# Patient Record
Sex: Male | Born: 1970 | Race: White | Hispanic: No | Marital: Married | Smoking: Former smoker
Health system: Southern US, Community
[De-identification: ages and names within clinical notes are randomized; demographics above are authoritative.]

## PROBLEM LIST (undated history)

## (undated) DIAGNOSIS — K219 Gastro-esophageal reflux disease without esophagitis: Secondary | ICD-10-CM

## (undated) DIAGNOSIS — I10 Essential (primary) hypertension: Secondary | ICD-10-CM

## (undated) DIAGNOSIS — I712 Thoracic aortic aneurysm, without rupture: Secondary | ICD-10-CM

## (undated) DIAGNOSIS — K7460 Unspecified cirrhosis of liver: Secondary | ICD-10-CM

## (undated) DIAGNOSIS — I7121 Aneurysm of the ascending aorta, without rupture: Secondary | ICD-10-CM

## (undated) DIAGNOSIS — E66811 Obesity, class 1: Secondary | ICD-10-CM

## (undated) DIAGNOSIS — G4730 Sleep apnea, unspecified: Secondary | ICD-10-CM

## (undated) DIAGNOSIS — E782 Mixed hyperlipidemia: Secondary | ICD-10-CM

## (undated) DIAGNOSIS — M7710 Lateral epicondylitis, unspecified elbow: Secondary | ICD-10-CM

## (undated) DIAGNOSIS — Z8669 Personal history of other diseases of the nervous system and sense organs: Secondary | ICD-10-CM

## (undated) DIAGNOSIS — K5792 Diverticulitis of intestine, part unspecified, without perforation or abscess without bleeding: Secondary | ICD-10-CM

## (undated) DIAGNOSIS — G4700 Insomnia, unspecified: Secondary | ICD-10-CM

## (undated) DIAGNOSIS — Z85038 Personal history of other malignant neoplasm of large intestine: Secondary | ICD-10-CM

## (undated) DIAGNOSIS — N183 Chronic kidney disease, stage 3 unspecified: Secondary | ICD-10-CM

## (undated) DIAGNOSIS — J4520 Mild intermittent asthma, uncomplicated: Secondary | ICD-10-CM

## (undated) DIAGNOSIS — R519 Headache, unspecified: Secondary | ICD-10-CM

## (undated) DIAGNOSIS — C801 Malignant (primary) neoplasm, unspecified: Secondary | ICD-10-CM

## (undated) DIAGNOSIS — E669 Obesity, unspecified: Secondary | ICD-10-CM

## (undated) HISTORY — DX: Obesity, unspecified: E66.9

## (undated) HISTORY — DX: Mixed hyperlipidemia: E78.2

## (undated) HISTORY — DX: Thoracic aortic aneurysm, without rupture: I71.2

## (undated) HISTORY — DX: Unspecified cirrhosis of liver: K74.60

## (undated) HISTORY — DX: Diverticulitis of intestine, part unspecified, without perforation or abscess without bleeding: K57.92

## (undated) HISTORY — DX: Mild intermittent asthma, uncomplicated: J45.20

## (undated) HISTORY — DX: Insomnia, unspecified: G47.00

## (undated) HISTORY — DX: Headache, unspecified: R51.9

## (undated) HISTORY — PX: COLONOSCOPY: SHX174

## (undated) HISTORY — DX: Essential (primary) hypertension: I10

## (undated) HISTORY — DX: Lateral epicondylitis, unspecified elbow: M77.10

## (undated) HISTORY — DX: Gastro-esophageal reflux disease without esophagitis: K21.9

## (undated) HISTORY — DX: Aneurysm of the ascending aorta, without rupture: I71.21

## (undated) HISTORY — DX: Obesity, class 1: E66.811

## (undated) HISTORY — DX: Chronic kidney disease, stage 3 unspecified: N18.30

## (undated) HISTORY — DX: Personal history of other diseases of the nervous system and sense organs: Z86.69

---
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
## 2013-04-17 DIAGNOSIS — Z85038 Personal history of other malignant neoplasm of large intestine: Secondary | ICD-10-CM

## 2013-04-17 HISTORY — PX: COLON SURGERY: SHX602

## 2013-04-17 HISTORY — DX: Personal history of other malignant neoplasm of large intestine: Z85.038

## 2013-04-17 HISTORY — PX: COLOSTOMY: SHX63

## 2014-04-17 HISTORY — PX: ILEOSTOMY: SHX1783

## 2019-01-01 ENCOUNTER — Other Ambulatory Visit: Payer: Self-pay

## 2019-01-01 ENCOUNTER — Ambulatory Visit (INDEPENDENT_AMBULATORY_CARE_PROVIDER_SITE_OTHER): Payer: PRIVATE HEALTH INSURANCE | Admitting: Family Medicine

## 2019-01-01 ENCOUNTER — Encounter: Payer: Self-pay | Admitting: Family Medicine

## 2019-01-01 DIAGNOSIS — M25521 Pain in right elbow: Secondary | ICD-10-CM | POA: Diagnosis not present

## 2019-01-01 NOTE — Progress Notes (Signed)
   Office Visit Note   Patient: Clayton Hernandez           Date of Birth: 1970/10/01           MRN: [id number] Visit Date: 01/01/2019 Requested by: No referring provider defined for this encounter. PCP: No primary care provider on file.  Subjective: Chief Complaint  Patient presents with  . Right Elbow - Pain    Pain lateral aspect of elbow, with pain and numbness in the forearm and into the 5th finger. Popping/cracking in elbow. NKI, but did recently move here from [place].    HPI: He is here with right elbow pain.  He is right-hand dominant, does not recall a specific injury.  He and his wife recently moved to [place] from [place].  He has had pain on the lateral aspect of his elbow for the past couple months.  Occasional popping in his elbow but no locking.  Denies any numbness or tingling.  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              ROS: Denies fevers or chills.  All other systems were reviewed and are negative.  Objective: Vital Signs: There were no vitals taken for this visit.  Physical Exam:  General:  Alert and oriented, in no acute distress. Pulm:  Breathing unlabored. Psy:  Normal mood, congruent affect. Skin: Multiple tattoos but no erythema or rash. Right elbow: No detectable effusion, full range of motion with flexion, extension, forearm pronation and supination.  He has point tenderness at the common extensor tendon at the lateral epicondyle, no tenderness at the radial tunnel.  Pain with wrist extension and third finger extension against resistance as well as forearm pronation and supination.  Imaging: Limited diagnostic ultrasound was performed but not recorded.  He has tendinopathy changes at the common extensor tendon but no obvious tears.  No detectable joint effusion.  Assessment & Plan: 1.  Right elbow pain due to lateral epicondylitis -Discussed options with him and elected to inject  with steroid today.  If symptoms persist then physical/hand therapy.  He will be cautious with activities for the next 10 to 14 days.     Procedures: Right elbow injection: After sterile prep with Betadine, injected 3 cc 1% lidocaine without epinephrine and 40 mg methylprednisolone into the area of maximum tenderness at the common extensor tendon.  He had excellent immediate relief.    PMFS History: There are no active problems to display for this patient.  History reviewed. No pertinent past medical history.  History reviewed. No pertinent family history.  History reviewed. No pertinent surgical history. Social History   Occupational History  . Not on file  Tobacco Use  . Smoking status: Former Smoker    Types: Cigarettes    Quit date: 12/31/1988    Years since quitting: 30.0  . Smokeless tobacco: Never Used  Substance and Sexual Activity  . Alcohol use: Yes    Comment: rarely  . Drug use: Not on file  . Sexual activity: Not on file

## 2019-01-20 NOTE — Progress Notes (Deleted)
Cardiology Office Note   Date:  01/22/2019   ID:  MAYNARD PINTA, DOB 1970/08/25, MRN [id number]  PCP:  No primary care provider on file.  Cardiologist:   No primary care provider on file. Referring:  ***  No chief complaint on file.     History of Present Illness: Clayton Hernandez is a 48 y.o. male who presents for ***     No past medical history on file.  No past surgical history on file.   Current Outpatient Medications  Medication Sig Dispense Refill  . ALBUTEROL SULFATE HFA IN Inhale into the lungs as needed.    [name] amLODipine (NORVASC) 5 MG tablet Take 5 mg by mouth daily.    . fenofibrate (TRICOR) 145 MG tablet Take 145 mg by mouth daily.    . metoprolol tartrate (LOPRESSOR) 50 MG tablet Take 50 mg by mouth daily.    [name] omeprazole (PRILOSEC OTC) 20 MG tablet Take 20 mg by mouth daily.    . rosuvastatin (CRESTOR) 20 MG tablet Take 20 mg by mouth daily.    . valsartan (DIOVAN) 160 MG tablet Take 160 mg by mouth daily.     No current facility-administered medications for this visit.     Allergies:   Morphine and related, Penicillins, and Toradol [ketorolac tromethamine]    Social History:  The patient  reports that he quit smoking about 30 years ago. His smoking use included cigarettes. He has never used smokeless tobacco. He reports current alcohol use.   Family History:  The patient's ***family history is not on file.    ROS:  Please see the history of present illness.   Otherwise, review of systems are positive for {NONE DEFAULTED:18576::"none"}.   All other systems are reviewed and negative.    PHYSICAL EXAM: VS:  There were no vitals taken for this visit. , BMI There is no height or weight on file to calculate BMI. GENERAL:  Well appearing HEENT:  Pupils equal round and reactive, fundi not visualized, oral mucosa unremarkable NECK:  No jugular venous distention, waveform within normal limits, carotid upstroke brisk and symmetric, no bruits, no  thyromegaly LYMPHATICS:  No cervical, inguinal adenopathy LUNGS:  Clear to auscultation bilaterally BACK:  No CVA tenderness CHEST:  Unremarkable HEART:  PMI not displaced or sustained,S1 and S2 within normal limits, no S3, no S4, no clicks, no rubs, *** murmurs ABD:  Flat, positive bowel sounds normal in frequency in pitch, no bruits, no rebound, no guarding, no midline pulsatile mass, no hepatomegaly, no splenomegaly EXT:  2 plus pulses throughout, no edema, no cyanosis no clubbing SKIN:  No rashes no nodules NEURO:  Cranial nerves II through XII grossly intact, motor grossly intact throughout PSYCH:  Cognitively intact, oriented to person place and time    EKG:  EKG {ACTION; IS/IS GI:087931 ordered today. The ekg ordered today demonstrates ***   Recent Labs: No results found for requested labs within last 8760 hours.    Lipid Panel No results found for: CHOL, TRIG, HDL, CHOLHDL, VLDL, LDLCALC, LDLDIRECT    Wt Readings from Last 3 Encounters:  No data found for Wt      Other studies Reviewed: Additional studies/ records that were reviewed today include: ***. Review of the above records demonstrates:  Please see elsewhere in the note.  ***   ASSESSMENT AND PLAN:  ***   Current medicines are reviewed at length with the patient today.  The patient {ACTIONS; HAS/DOES NOT HAVE:19233} concerns regarding  medicines.  The following changes have been made:  {PLAN; NO CHANGE:13088:s}  Labs/ tests ordered today include: *** No orders of the defined types were placed in this encounter.    Disposition:   FU with ***    Signed, [name], MD  01/22/2019 9:10 AM    [place]

## 2019-01-22 ENCOUNTER — Ambulatory Visit: Payer: Self-pay | Admitting: Cardiology

## 2019-01-29 ENCOUNTER — Encounter: Payer: Self-pay | Admitting: Family Medicine

## 2019-01-29 ENCOUNTER — Other Ambulatory Visit: Payer: Self-pay

## 2019-01-29 ENCOUNTER — Ambulatory Visit (INDEPENDENT_AMBULATORY_CARE_PROVIDER_SITE_OTHER): Payer: PRIVATE HEALTH INSURANCE | Admitting: Family Medicine

## 2019-01-29 VITALS — BP 138/90 | HR 66 | Temp 98.5°F | Resp 16 | Ht 71.0 in | Wt 222.6 lb

## 2019-01-29 DIAGNOSIS — E782 Mixed hyperlipidemia: Secondary | ICD-10-CM | POA: Diagnosis not present

## 2019-01-29 DIAGNOSIS — Z85038 Personal history of other malignant neoplasm of large intestine: Secondary | ICD-10-CM

## 2019-01-29 DIAGNOSIS — K7460 Unspecified cirrhosis of liver: Secondary | ICD-10-CM | POA: Diagnosis not present

## 2019-01-29 DIAGNOSIS — I10 Essential (primary) hypertension: Secondary | ICD-10-CM

## 2019-01-29 MED ORDER — AMLODIPINE BESYLATE 10 MG PO TABS: 10.0000 mg | ORAL_TABLET | Freq: Every day | ORAL | 3 refills | Status: DC

## 2019-01-29 NOTE — Progress Notes (Signed)
Office Note 01/29/2019  CC:  Chief Complaint  Patient presents with  . Establish Care    Previous PCP, Dr.Goodrich in [place]    HPI:  Clayton Hernandez is a 48 y.o. male who is here to establish care, f/u HTN, mixed HLD.  Patient's most recent primary MD: see above. Old records were not reviewed prior to or during today's visit.  He monitors bp at home some and he often sees high [id number] systolic.  Compliant with meds. HLD: tolerating crestor 20mg  qd, compliant.  Has a long, complicated GI surgery hx and also gives report of dx of nonalcoholic cirrhosis. No known hx of ascites or other decompensation.  Has appt with cardiology 02/03/19, has long hx of ascending aortic aneurism that pt states has been stable but is 4.7 cm in size.   Past Medical History:  Diagnosis Date  . Ascending aortic aneurysm (HCC)    4.7 cm, most recent imaging was 12/2018->stable.  . Cirrhosis, nonalcoholic (HCC)    NAFLD  . Diverticulitis   . Essential hypertension   . Frequent headaches   . GERD (gastroesophageal reflux disease)   . History of colon cancer 2015   Surgery; but no chemo or rad was required.  Had a total of 5 surgeries, hx of colostomy, then an ileostomy.  [name] Hx of migraines   . Hyperlipemia, mixed   . Insomnia   . Lateral epicondylitis    steroid injection by Dr. Junius Roads 01/01/19.  . Mild intermittent asthma   . Obesity, Class I, BMI 30-34.9     Past Surgical History:  Procedure Laterality Date  . COLON SURGERY  2015   2 cm colon ca excised.  . COLONOSCOPY  many   he has had colonoscopy q64mo since 2015.  Most recent was approx 04/2018->normal.  . COLOSTOMY  2015   diverticulitis   . ILEOSTOMY  2016    Family History  Problem Relation Age of Onset  . Lymphoma Mother   . Cancer Father        ureter  . Stomach cancer Father   . Heart disease Father   . High blood pressure Father   . High Cholesterol Father   . Thyroid cancer Sister     Social History    Socioeconomic History  . Marital status: Married    Spouse name: Not on file  . Number of children: Not on file  . Years of education: Not on file  . Highest education level: Not on file  Occupational History  . Not on file  Social Needs  . Financial resource strain: Not on file  . Food insecurity    Worry: Not on file    Inability: Not on file  . Transportation needs    Medical: Not on file    Non-medical: Not on file  Tobacco Use  . Smoking status: Former Smoker    Types: Cigarettes    Quit date: 12/31/1988    Years since quitting: 30.0  . Smokeless tobacco: Never Used  Substance and Sexual Activity  . Alcohol use: Yes    Comment: rarely  . Drug use: Not on file  . Sexual activity: Not on file  Lifestyle  . Physical activity    Days per week: Not on file    Minutes per session: Not on file  . Stress: Not on file  Relationships  . Social Herbalist on phone: Not on file    Gets together: Not on file  Attends religious service: Not on file    Active member of club or organization: Not on file    Attends meetings of clubs or organizations: Not on file    Relationship status: Not on file  . Intimate partner violence    Fear of current or ex partner: Not on file    Emotionally abused: Not on file    Physically abused: Not on file    Forced sexual activity: Not on file  Other Topics Concern  . Not on file  Social History Narrative   Married, 2 daughters and 1 son.   Relocated from [place] to [place].  Born and raised in [place].  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   No T/A/Ds.    Outpatient Encounter Medications as of 01/29/2019  Medication Sig  . ALBUTEROL SULFATE HFA IN Inhale into the lungs as needed.  . fenofibrate (TRICOR) 145 MG tablet Take 145 mg by mouth daily.  . metoprolol succinate (TOPROL-XL) 50 MG 24 hr tablet Take 50 mg by mouth daily. Take with  or immediately following a meal.  . omeprazole (PRILOSEC OTC) 20 MG tablet Take 20 mg by mouth daily.  . rosuvastatin (CRESTOR) 20 MG tablet Take 20 mg by mouth daily.  . valsartan (DIOVAN) 160 MG tablet Take 160 mg by mouth daily.  . [DISCONTINUED] amLODipine (NORVASC) 5 MG tablet Take 5 mg by mouth daily.  . [DISCONTINUED] metoprolol tartrate (LOPRESSOR) 50 MG tablet Take 50 mg by mouth daily.  [name] amLODipine (NORVASC) 10 MG tablet Take 1 tablet (10 mg total) by mouth daily.   No facility-administered encounter medications on file as of 01/29/2019.     Allergies  Allergen Reactions  . Morphine And Related Other (See Comments)    "all opiates" - "they'll kill me"  . Penicillins Anaphylaxis  . Toradol [Ketorolac Tromethamine]     ROS Review of Systems  Constitutional: Negative for fatigue and fever.  HENT: Negative for congestion and sore throat.   Eyes: Negative for visual disturbance.  Respiratory: Negative for cough.   Cardiovascular: Negative for chest pain.  Gastrointestinal: Negative for abdominal pain and nausea.  Genitourinary: Negative for dysuria.  Musculoskeletal: Negative for back pain and joint swelling.  Skin: Negative for rash.  Neurological: Negative for weakness and headaches.  Hematological: Negative for adenopathy.    PE; Blood pressure 138/90, pulse 66, temperature 98.5 F (36.9 C), temperature source Temporal, resp. rate 16, height 5\' 11"  (1.803 m), weight 222 lb 9.6 oz (101 kg), SpO2 99 %. Body mass index is 31.05 kg/m.  Gen: Alert, well appearing.  Patient is oriented to person, place, time, and situation. AFFECT: pleasant, lucid thought and speech. [id number]: no injection, icteris, swelling, or exudate.  EOMI, PERRLA. Mouth: lips without lesion/swelling.  Oral mucosa pink and moist. Oropharynx without erythema, exudate, or swelling.  Neck: supple/nontender.  No LAD, mass, or TM.  Carotid pulses 2+ bilaterally, without bruits. CV: RRR, no m/r/g.    LUNGS: CTA bilat, nonlabored resps, good aeration in all lung fields. ABD: soft, NT/ND EXT: no clubbing or cyanosis.  no edema.  Skin - no pallor or jaundice.  Pertinent labs:  None   ASSESSMENT AND PLAN:   New pt: he has all his old records but not with him at this time.  1) HTN: not ideal control.  Needs 120s/70s consistently given  his hx of aortic aneurism. Will just increase amlodipine to 10mg  qd today.  No change to his toprol or valsartan. He sees cardiology in 2 d. Continue home bp monitoring. Return for fasting labs: CMET, FLP, CBC.  2) HLD: tolerating statin. FLP and hepatic panel with future fasting labs.  3) Nonalcoholic cirrhosis/NAFLD: no old records for verification/clarification available to me at this time but I don't have reason to doubt patient's report. Refer to GI for this and for general f/u of his hx of colon ca.  4) Hx of colon cancer-> refer to GI.  An After Visit Summary was printed and given to the patient.  Return in about 6 months (around 07/30/2019) for annual CPE (fasting); also needs fasting lab appt at his earliest convenience.  Signed:  Crissie Sickles, MD           01/29/2019

## 2019-01-30 ENCOUNTER — Ambulatory Visit (INDEPENDENT_AMBULATORY_CARE_PROVIDER_SITE_OTHER): Payer: PRIVATE HEALTH INSURANCE | Admitting: Family Medicine

## 2019-01-30 DIAGNOSIS — E782 Mixed hyperlipidemia: Secondary | ICD-10-CM | POA: Diagnosis not present

## 2019-01-30 DIAGNOSIS — K7460 Unspecified cirrhosis of liver: Secondary | ICD-10-CM

## 2019-01-30 DIAGNOSIS — I10 Essential (primary) hypertension: Secondary | ICD-10-CM

## 2019-01-30 LAB — CBC WITH DIFFERENTIAL/PLATELET
Basophils Absolute: 0.1 10*3/uL (ref 0.0–0.1)
Basophils Relative: 0.9 % (ref 0.0–3.0)
Eosinophils Absolute: 0.1 10*3/uL (ref 0.0–0.7)
Eosinophils Relative: 1 % (ref 0.0–5.0)
HCT: 47.7 % (ref 39.0–52.0)
Hemoglobin: 16.1 g/dL (ref 13.0–17.0)
Lymphocytes Relative: 32.4 % (ref 12.0–46.0)
Lymphs Abs: 1.9 10*3/uL (ref 0.7–4.0)
MCHC: 33.7 g/dL (ref 30.0–36.0)
MCV: 90.4 fl (ref 78.0–100.0)
Monocytes Absolute: 0.6 10*3/uL (ref 0.1–1.0)
Monocytes Relative: 9.7 % (ref 3.0–12.0)
Neutro Abs: 3.2 10*3/uL (ref 1.4–7.7)
Neutrophils Relative %: 56 % (ref 43.0–77.0)
Platelets: 121 10*3/uL — ABNORMAL LOW (ref 150.0–400.0)
RBC: 5.28 Mil/uL (ref 4.22–5.81)
RDW: 14.7 % (ref 11.5–15.5)
WBC: 5.8 10*3/uL (ref 4.0–10.5)

## 2019-01-30 LAB — LIPID PANEL
Cholesterol: 137 mg/dL (ref 0–200)
HDL: 39 mg/dL — ABNORMAL LOW (ref >39.00)
LDL Cholesterol: 67 mg/dL (ref 0–99)
NonHDL: 97.86
Total CHOL/HDL Ratio: 4
Triglycerides: 156 mg/dL — ABNORMAL HIGH (ref 0.0–149.0)
VLDL: 31.2 mg/dL (ref 0.0–40.0)

## 2019-01-30 LAB — COMPREHENSIVE METABOLIC PANEL
ALT: 29 U/L (ref 0–53)
AST: 29 U/L (ref 0–37)
Albumin: 4.7 g/dL (ref 3.5–5.2)
Alkaline Phosphatase: 68 U/L (ref 39–117)
BUN: 27 mg/dL — ABNORMAL HIGH (ref 6–23)
CO2: 27 mEq/L (ref 19–32)
Calcium: 9.4 mg/dL (ref 8.4–10.5)
Chloride: 105 mEq/L (ref 96–112)
Creatinine, Ser: 1.35 mg/dL (ref 0.40–1.50)
GFR: 55.89 mL/min — ABNORMAL LOW (ref >60.00)
Glucose, Bld: 98 mg/dL (ref 70–99)
Potassium: 3.9 mEq/L (ref 3.5–5.1)
Sodium: 139 mEq/L (ref 135–145)
Total Bilirubin: 1 mg/dL (ref 0.2–1.2)
Total Protein: 7.1 g/dL (ref 6.0–8.3)

## 2019-02-02 NOTE — Progress Notes (Signed)
Cardiology Office Note   Date:  02/04/2019    ID:  Clayton Hernandez, DOB Sep 11, 1970, MRN [id number]  PCP:  [name], MD  Cardiologist:   No primary care provider on file. Referring:  [name], MD  Chief Complaint  Patient presents with  . Thoracic Aortic Aneurysm      History of Present Illness: Clayton Hernandez is a 49 y.o. male who presents for follow up of a thoracic ascending aneurysm.  He has moved here from [place].  He has had a complicated history but per diverticulum that required surgery.  He subsequently was found to have small colon cancers that were resected.  During all of this he was found to have an enlarged thoracic aorta that has now been followed.  There is CT from last year it was 4.4 cm.  Prior to that he reportedly had been larger.  I do note that there was no calcium noted in his coronaries at that time.  He had a bovine aortic arch variant.  He was apparently having some symptoms of chest discomfort last year before he moved from [place] and reports having had a [employer] that he says was negative.  He had always been a healthy person.  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  He is very active in his yard.  He did some hiking yesterday and he did well.  He denies any cardiovascular symptoms such as chest pressure, neck or arm discomfort.  He has had no palpitations, presyncope or syncope.  He denies any PND or orthopnea.  He has had no weight gain or edema.  He does feel fatigued.  He sleeps very poorly.  He does have CPAP which he thinks works.  However, he does not sleep through the night.  This is happened probably for the last 19 years or so.  He cannot turn his mind off.  He takes Ambien rarely but does not like it.  He does not want to take a lot of these medications.   Past Medical History:  Diagnosis Date  . Ascending aortic aneurysm (HCC)    4.7 cm, most recent imaging was 12/2018->stable.  .  Chronic renal insufficiency, stage 3 (moderate)    GFR 55 ml/min  . Cirrhosis, nonalcoholic (HCC)    NAFLD  . Diverticulitis   . Essential hypertension   . Frequent headaches   . GERD (gastroesophageal reflux disease)   . History of colon cancer 2015   Surgery; but no chemo or rad was required.  Had a total of 5 surgeries, hx of colostomy, then an ileostomy.  [name] Hx of migraines   . Hyperlipemia, mixed   . Insomnia   . Lateral epicondylitis    steroid injection by Dr. Junius Roads 01/01/19.  . Mild intermittent asthma   . Obesity, Class I, BMI 30-34.9     Past Surgical History:  Procedure Laterality Date  . COLON SURGERY  2015   2 cm colon ca excised.  . COLONOSCOPY  many   he has had colonoscopy q52mo since 2015.  Most recent was approx 04/2018->normal.  . COLOSTOMY  2015   diverticulitis   . ILEOSTOMY  2016     Current Outpatient Medications  Medication Sig Dispense Refill  . ALBUTEROL SULFATE HFA IN Inhale into the lungs as needed.    [name] amLODipine (NORVASC) 10 MG tablet Take 1 tablet (10 mg total) by mouth daily.  90 tablet 3  . fenofibrate (TRICOR) 145 MG tablet Take 145 mg by mouth daily.    . metoprolol succinate (TOPROL-XL) 50 MG 24 hr tablet Take 50 mg by mouth daily. Take with or immediately following a meal.    . omeprazole (PRILOSEC OTC) 20 MG tablet Take 20 mg by mouth daily.    . rosuvastatin (CRESTOR) 20 MG tablet Take 20 mg by mouth daily.    . valsartan (DIOVAN) 160 MG tablet Take 160 mg by mouth daily.     No current facility-administered medications for this visit.     Allergies:   Morphine and related, Penicillins, and Toradol [ketorolac tromethamine]    Social History:  The patient  reports that he quit smoking about 30 years ago. His smoking use included cigarettes. He has never used smokeless tobacco. He reports current alcohol use.   Family History:  The patient's family history includes Cancer in his father; Heart disease (age of onset: 50) in his father;  High Cholesterol in his father; High blood pressure in his father; Lymphoma in his mother; Stomach cancer in his father; Thyroid cancer in his sister.    ROS:  Please see the history of present illness.   Otherwise, review of systems are positive for none.   All other systems are reviewed and negative.    PHYSICAL EXAM: VS:  BP 114/82   Pulse (!) 58   Temp (!) 97 F (36.1 C) (Temporal)   Ht 6\' 2"  (1.88 m)   Wt 224 lb 3.2 oz (101.7 kg)   SpO2 94%   BMI 28.79 kg/m  , BMI Body mass index is 28.79 kg/m. GENERAL:  Well appearing HEENT:  Pupils equal round and reactive, fundi not visualized, oral mucosa unremarkable NECK:  No jugular venous distention, waveform within normal limits, carotid upstroke brisk and symmetric, no bruits, no thyromegaly LYMPHATICS:  No cervical, inguinal adenopathy LUNGS:  Clear to auscultation bilaterally BACK:  No CVA tenderness CHEST:  Unremarkable HEART:  PMI not displaced or sustained,S1 and S2 within normal limits, no S3, no S4, no clicks, no rubs, no murmurs ABD:  Flat, positive bowel sounds normal in frequency in pitch, no bruits, no rebound, no guarding, no midline pulsatile mass, no hepatomegaly, no splenomegaly EXT:  2 plus pulses throughout, no edema, no cyanosis no clubbing SKIN:  No rashes no nodules NEURO:  Cranial nerves II through XII grossly intact, motor grossly intact throughout PSYCH:  Cognitively intact, oriented to person place and time    EKG:  EKG is ordered today. The ekg ordered today demonstrates sinus rhythm, rate 58 axis within normal limits, intervals within normal limits, no acute ST-T wave changes.   Recent Labs: 01/30/2019: ALT 29; BUN 27; Creatinine, Ser 1.35; Hemoglobin 16.1; Platelets 121.0; Potassium 3.9; Sodium 139    Lipid Panel    Component Value Date/Time   CHOL 137 01/30/2019 1016   TRIG 156.0 (H) 01/30/2019 1016   HDL 39.00 (L) 01/30/2019 1016   CHOLHDL 4 01/30/2019 1016   VLDL 31.2 01/30/2019 1016    LDLCALC 67 01/30/2019 1016      Wt Readings from Last 3 Encounters:  02/03/19 224 lb 3.2 oz (101.7 kg)  01/29/19 222 lb 9.6 oz (101 kg)      Other studies Reviewed: Additional studies/ records that were reviewed today include: CTA Thoracic aorta. Review of the above records demonstrates:  Please see elsewhere in the note.     ASSESSMENT AND PLAN:  THORACIC AORTIC ANEURYSM: He  needs follow-up with a CT angiogram.  I will arrange this.  INSOMNIA: The patient has significant fatigue probably related in part to insomnia.  I have arranged follow-up with Dr. Brett Fairy not only to follow his sleep apnea but also discuss potential medical therapies for this.  FATIGUE: The patient does have tiredness.  This may be related to insomnia.  Ambien and reduce his metoprolol very slightly as I think his pressure is well controlled perhaps some of his tiredness might be related.    HTN: He recently had his Norvasc increased and I agree with this.  He needs tight blood pressure control.   Current medicines are reviewed at length with the patient today.  The patient does not have concerns regarding medicines.  The following changes have been made:  As above  Labs/ tests ordered today include: None  Orders Placed This Encounter  Procedures  . CT ANGIO CHEST AORTA W &/OR WO CONTRAST  . Ambulatory referral to Neurology  . EKG 12-Lead     Disposition:   FU with me in four months.     Signed, [name], MD  02/04/2019 6:20 PM    [facility]

## 2019-02-03 ENCOUNTER — Other Ambulatory Visit: Payer: Self-pay

## 2019-02-03 ENCOUNTER — Encounter: Payer: Self-pay | Admitting: Cardiology

## 2019-02-03 ENCOUNTER — Ambulatory Visit (INDEPENDENT_AMBULATORY_CARE_PROVIDER_SITE_OTHER): Payer: PRIVATE HEALTH INSURANCE | Admitting: Cardiology

## 2019-02-03 VITALS — BP 114/82 | HR 58 | Temp 97.0°F | Ht 74.0 in | Wt 224.2 lb

## 2019-02-03 DIAGNOSIS — I712 Thoracic aortic aneurysm, without rupture, unspecified: Secondary | ICD-10-CM

## 2019-02-03 DIAGNOSIS — R0681 Apnea, not elsewhere classified: Secondary | ICD-10-CM

## 2019-02-03 DIAGNOSIS — I7121 Aneurysm of the ascending aorta, without rupture: Secondary | ICD-10-CM

## 2019-02-03 NOTE — Patient Instructions (Addendum)
Medication Instructions:  Your physician recommends that you continue on your current medications as directed. Please refer to the Current Medication list given to you today.  *If you need a refill on your cardiac medications before your next appointment, please call your pharmacy*  Lab Work: BMET 1 WEEK PRIOR TO CT  Testing/Procedures: Non-Cardiac CT scanning, (CAT scanning), is a noninvasive, special x-ray that produces cross-sectional images of the body using x-rays and a computer. CT scans help physicians diagnose and treat medical conditions. For some CT exams, a contrast material is used to enhance visibility in the area of the body being studied. CT scans provide greater clarity and reveal more details than regular x-ray exams.  Follow-Up: At [facility], you and your health needs are our priority.  As part of our continuing mission to provide you with exceptional heart care, we have created designated Provider Care Teams.  These Care Teams include your primary Cardiologist (physician) and Advanced Practice Providers (APPs -  Physician Assistants and Nurse Practitioners) who all work together to provide you with the care you need, when you need it.  Your next appointment:   4 months  The format for your next appointment:   In Person  Provider:   You may see [name]  or one of the following Advanced Practice Providers on your designated Care Team:    Rosaria Ferries, PA-C  Jory Sims, DNP, ANP  Cadence Kathlen Mody, NP  You have been referred to [place] AT [phone]

## 2019-02-04 ENCOUNTER — Encounter: Payer: Self-pay | Admitting: Cardiology

## 2019-02-18 ENCOUNTER — Ambulatory Visit (INDEPENDENT_AMBULATORY_CARE_PROVIDER_SITE_OTHER)
Admission: RE | Admit: 2019-02-18 | Discharge: 2019-02-18 | Disposition: A | Discharge status: Home / Self Care | Payer: PRIVATE HEALTH INSURANCE | Source: Ambulatory Visit | Attending: Cardiology | Admitting: Cardiology

## 2019-02-18 ENCOUNTER — Other Ambulatory Visit: Payer: Self-pay

## 2019-02-18 DIAGNOSIS — I712 Thoracic aortic aneurysm, without rupture: Secondary | ICD-10-CM | POA: Diagnosis not present

## 2019-02-18 DIAGNOSIS — I7121 Aneurysm of the ascending aorta, without rupture: Secondary | ICD-10-CM

## 2019-02-18 MED ORDER — IOHEXOL 350 MG/ML SOLN
100.0000 mL | Freq: Once | INTRAVENOUS | Status: AC | PRN
  Administered 2019-02-18: 09:00:00 100 mL via INTRAVENOUS

## 2019-02-20 ENCOUNTER — Telehealth: Payer: Self-pay | Admitting: Family Medicine

## 2019-02-20 NOTE — Telephone Encounter (Signed)
Do I need to do anything with this? It looks like the patient has an appointment with Dr. Junius Roads on 11/09.

## 2019-02-20 NOTE — Telephone Encounter (Signed)
Returned call to patient's wife Clayton Hernandez left message to return call per her request to schedule an appointment for cortisone injection with Dr Junius Roads  For patient   [phone]

## 2019-02-20 NOTE — Telephone Encounter (Signed)
No, I just noted that I contacted the patient and was waiting for a call back

## 2019-02-24 ENCOUNTER — Ambulatory Visit (INDEPENDENT_AMBULATORY_CARE_PROVIDER_SITE_OTHER): Payer: PRIVATE HEALTH INSURANCE | Admitting: Family Medicine

## 2019-02-24 ENCOUNTER — Encounter: Payer: Self-pay | Admitting: Family Medicine

## 2019-02-24 ENCOUNTER — Other Ambulatory Visit: Payer: Self-pay

## 2019-02-24 DIAGNOSIS — M25521 Pain in right elbow: Secondary | ICD-10-CM

## 2019-02-24 NOTE — Progress Notes (Signed)
Office Visit Note   Patient: Clayton Hernandez           Date of Birth: 02-14-1971           MRN: [id number] Visit Date: 02/24/2019 Requested by: [name], MD [address] [place],  [place] [address] PCP: [name], MD  Subjective: Chief Complaint  Patient presents with  . Right Elbow - Pain    Pain returned about 1 week ago in the elbow. Pain is the same as before, but not as severe. No new injury.    HPI: He is here with recurrent right elbow pain.  He was completely pain-free until about a week ago.  Pain again on the lateral elbow, hurts when doing repetitive activities.  Occasional tingling into his hand but not frequently.               ROS:   All other systems were reviewed and are negative.  Objective: Vital Signs: There were no vitals taken for this visit.  Physical Exam:  General:  Alert and oriented, in no acute distress. Pulm:  Breathing unlabored. Psy:  Normal mood, congruent affect.  Right elbow: Full range of motion, no effusion.  Point tender at the common extensor tendon at the lateral epicondyle.  No tenderness at the radial tunnel.  Pain with wrist extension and third finger extension against resistance.  Imaging: None today.  Assessment & Plan: 1.  Recurrent right lateral epicondylitis -Discussed with patient, he wants to try 1 more injection.  If this still does not help, then physical therapy.     Procedures: Right elbow injection: After sterile prep with Betadine, injected 2 cc 1% lidocaine without epinephrine and 40 mg methylprednisolone into the area of maximum tenderness at the common extensor tendon.    PMFS History: There are no active problems to display for this patient.  Past Medical History:  Diagnosis Date  . Ascending aortic aneurysm (HCC)    4.7 cm, most recent imaging was 12/2018->stable.  . Chronic renal insufficiency, stage 3 (moderate)    GFR 55 ml/min  . Cirrhosis, nonalcoholic (HCC)    NAFLD  .  Diverticulitis   . Essential hypertension   . Frequent headaches   . GERD (gastroesophageal reflux disease)   . History of colon cancer 2015   Surgery; but no chemo or rad was required.  Had a total of 5 surgeries, hx of colostomy, then an ileostomy.  [name] Hx of migraines   . Hyperlipemia, mixed   . Insomnia   . Lateral epicondylitis    steroid injection by Dr. Junius Roads 01/01/19.  . Mild intermittent asthma   . Obesity, Class I, BMI 30-34.9     Family History  Problem Relation Age of Onset  . Lymphoma Mother   . Cancer Father        ureter  . Stomach cancer Father   . Heart disease Father 87       Stents  . High blood pressure Father   . High Cholesterol Father   . Thyroid cancer Sister     Past Surgical History:  Procedure Laterality Date  . COLON SURGERY  2015   2 cm colon ca excised.  . COLONOSCOPY  many   he has had colonoscopy q68mo since 2015.  Most recent was approx 04/2018->normal.  . COLOSTOMY  2015   diverticulitis   . ILEOSTOMY  2016   Social History   Occupational History  . Not on  file  Tobacco Use  . Smoking status: Former Smoker    Types: Cigarettes    Quit date: 12/31/1988    Years since quitting: 30.1  . Smokeless tobacco: Never Used  Substance and Sexual Activity  . Alcohol use: Yes    Comment: rarely  . Drug use: Not on file  . Sexual activity: Not on file

## 2019-02-26 ENCOUNTER — Encounter: Payer: Self-pay | Admitting: Family Medicine

## 2019-03-04 ENCOUNTER — Telehealth: Payer: Self-pay | Admitting: *Deleted

## 2019-03-04 DIAGNOSIS — M79604 Pain in right leg: Secondary | ICD-10-CM

## 2019-03-04 DIAGNOSIS — M79605 Pain in left leg: Secondary | ICD-10-CM

## 2019-03-04 NOTE — Telephone Encounter (Signed)
Pain started about 2 months ago, stated discussed briefly at visit but he got sidetracked. Pain no worse than 2 months ago it is just new Denies swelling or discoloration Pain worse when up walking on it but not with movement while sitting Mychart message below   From  Clayton Hernandez [id number] To  [name], MD Sent  03/03/2019 1:09 PM  Hello [name], during our visit I forgot to follow up on my pain in my left calf area, the pain is not the muscle it's behind it and really never goes away, I didn't think it was anything until I read a chart on the door in your office which described exactly what I'm feeling. Suggestions?  Will forward to [name] for review

## 2019-03-05 NOTE — Telephone Encounter (Signed)
He can get ABIs.

## 2019-03-06 NOTE — Telephone Encounter (Signed)
Advised patient, verbalized understanding. Order placed and sent to scheduling to arrange.

## 2019-03-17 ENCOUNTER — Ambulatory Visit (HOSPITAL_COMMUNITY)
Admission: RE | Admit: 2019-03-17 | Discharge: 2019-03-17 | Disposition: A | Discharge status: Home / Self Care | Payer: PRIVATE HEALTH INSURANCE | Source: Ambulatory Visit | Attending: Internal Medicine | Admitting: Internal Medicine

## 2019-03-17 ENCOUNTER — Other Ambulatory Visit: Payer: Self-pay

## 2019-03-17 ENCOUNTER — Encounter (HOSPITAL_COMMUNITY): Payer: PRIVATE HEALTH INSURANCE

## 2019-03-17 DIAGNOSIS — M79605 Pain in left leg: Secondary | ICD-10-CM | POA: Diagnosis not present

## 2019-03-17 DIAGNOSIS — M79604 Pain in right leg: Secondary | ICD-10-CM

## 2019-03-18 ENCOUNTER — Other Ambulatory Visit: Payer: Self-pay | Admitting: *Deleted

## 2019-03-18 HISTORY — PX: OTHER SURGICAL HISTORY: SHX169

## 2019-03-18 MED ORDER — OMEPRAZOLE MAGNESIUM 20 MG PO TBEC: 20.0000 mg | DELAYED_RELEASE_TABLET | Freq: Every day | ORAL | 3 refills | Status: DC

## 2019-03-18 MED ORDER — METOPROLOL SUCCINATE ER 25 MG PO TB24: 25.0000 mg | ORAL_TABLET | Freq: Every day | ORAL | 3 refills | Status: DC

## 2019-03-18 MED ORDER — FENOFIBRATE 145 MG PO TABS: 145.0000 mg | ORAL_TABLET | Freq: Every day | ORAL | 3 refills | Status: DC

## 2019-03-18 MED ORDER — VALSARTAN 160 MG PO TABS: 160.0000 mg | ORAL_TABLET | Freq: Every day | ORAL | 3 refills | Status: DC

## 2019-03-18 MED ORDER — AMLODIPINE BESYLATE 10 MG PO TABS: 10.0000 mg | ORAL_TABLET | Freq: Every day | ORAL | 3 refills | Status: DC

## 2019-03-18 MED ORDER — ROSUVASTATIN CALCIUM 20 MG PO TABS: 20.0000 mg | ORAL_TABLET | Freq: Every day | ORAL | 3 refills | Status: DC

## 2019-03-18 NOTE — Telephone Encounter (Signed)
mychart message sent requesting refills. Confirmed doses with patient, Toprol 50 mg reduced at last visit. Refilled and advised to get inhaler from PCP

## 2019-04-17 ENCOUNTER — Other Ambulatory Visit: Payer: Self-pay | Admitting: Pharmacist

## 2019-04-17 MED ORDER — FENOFIBRATE 134 MG PO CAPS: 134.0000 mg | ORAL_CAPSULE | Freq: Every day | ORAL | 6 refills | Status: DC

## 2019-04-22 ENCOUNTER — Encounter: Payer: Self-pay | Admitting: Family Medicine

## 2019-04-25 ENCOUNTER — Encounter: Payer: Self-pay | Admitting: Family Medicine

## 2019-04-25 ENCOUNTER — Other Ambulatory Visit: Payer: Self-pay

## 2019-04-25 ENCOUNTER — Ambulatory Visit (INDEPENDENT_AMBULATORY_CARE_PROVIDER_SITE_OTHER): Payer: PRIVATE HEALTH INSURANCE | Admitting: Family Medicine

## 2019-04-25 DIAGNOSIS — M25521 Pain in right elbow: Secondary | ICD-10-CM | POA: Diagnosis not present

## 2019-04-25 NOTE — Progress Notes (Signed)
Office Visit Note   Patient: Clayton Hernandez           Date of Birth: [birth year]           MRN: [id number] Visit Date: 04/25/2019 Requested by: [name], MD [address] [place],  [place] [address] PCP: [name], MD  Subjective: Chief Complaint  Patient presents with  . Right Elbow - Pain    Last cortisone injection last 8 weeks. The pain is not as severe yet. Numbness in part of hand again.    HPI: He is here with recurrent right elbow pain.  His last injection helped for 8 weeks and it just started to wear off.  Pain is not as severe, but is in the same location as before.  He gets a little bit of numbness in the fifth finger.  No neck pain.  He does note that he has been doing some heavy physical training exercises hanging from a rope from a helicopter, and he thinks this might have contributed to his pain.              ROS: No fever or chills.  All other systems were reviewed and are negative.  Objective: Vital Signs: There were no vitals taken for this visit.  Physical Exam:  General:  Alert and oriented, in no acute distress. Pulm:  Breathing unlabored. Psy:  Normal mood, congruent affect. Skin: No rash or atrophy. Right elbow: He is tender again in the common extensor tendon at the lateral epicondyle.  No tenderness at the radial tunnel.  He has pain with wrist extension and third finger extension against resistance.  Imaging: None today  Assessment & Plan: 1.  Recurrent right elbow lateral epicondylitis -1 more injection, start physical therapy.  Follow-up as needed.     Procedures: Right elbow injection: After sterile prep with Betadine, injected 3 cc 1% lidocaine without epinephrine and 40 mg methylprednisolone into the area of maximum tenderness at the common extensor tendon.    PMFS History: There are no problems to display for this patient.  Past Medical History:  Diagnosis Date  . Ascending aortic aneurysm (HCC)    4.7 cm,  most recent imaging was 12/2018->stable.  . Chronic renal insufficiency, stage 3 (moderate)    GFR 55 ml/min  . Cirrhosis, nonalcoholic (HCC)    NAFLD  . Diverticulitis   . Essential hypertension   . Frequent headaches   . GERD (gastroesophageal reflux disease)   . History of colon cancer 2015   Surgery; but no chemo or rad was required.  Had a total of 5 surgeries, hx of colostomy, then an ileostomy.  [name] Hx of migraines   . Hyperlipemia, mixed   . Insomnia   . Lateral epicondylitis    Recurrent: steroid injection by Dr. Junius Roads Sept and Nov 2020.  . Mild intermittent asthma   . Obesity, Class I, BMI 30-34.9     Family History  Problem Relation Age of Onset  . Lymphoma Mother   . Cancer Father        ureter  . Stomach cancer Father   . Heart disease Father 66       Stents  . High blood pressure Father   . High Cholesterol Father   . Thyroid cancer Sister     Past Surgical History:  Procedure Laterality Date  . ABI's  03/2019   Normal  . COLON SURGERY  2015   2 cm colon ca  excised.  . COLONOSCOPY  many   he has had colonoscopy q21mo since 2015.  Most recent was approx 04/2018->normal.  . COLOSTOMY  2015   diverticulitis   . ILEOSTOMY  2016   Social History   Occupational History  . Not on file  Tobacco Use  . Smoking status: Former Smoker    Types: Cigarettes    Quit date: 12/31/1988    Years since quitting: 30.3  . Smokeless tobacco: Never Used  Substance and Sexual Activity  . Alcohol use: Yes    Comment: rarely  . Drug use: Not on file  . Sexual activity: Not on file

## 2019-04-27 ENCOUNTER — Encounter: Payer: Self-pay | Admitting: Family Medicine

## 2019-06-07 DIAGNOSIS — I712 Thoracic aortic aneurysm, without rupture: Secondary | ICD-10-CM | POA: Insufficient documentation

## 2019-06-07 DIAGNOSIS — I7121 Aneurysm of the ascending aorta, without rupture: Secondary | ICD-10-CM | POA: Insufficient documentation

## 2019-06-07 DIAGNOSIS — I10 Essential (primary) hypertension: Secondary | ICD-10-CM | POA: Insufficient documentation

## 2019-06-07 DIAGNOSIS — Z7189 Other specified counseling: Secondary | ICD-10-CM | POA: Insufficient documentation

## 2019-06-07 NOTE — Progress Notes (Signed)
Cardiology Office Note   Date:  06/09/2019    ID:  Clayton Hernandez, DOB [birth year], MRN [id number]  PCP:  [name], MD  Cardiologist:   [name], MD Referring:  [name], MD  Chief Complaint  Patient presents with  . Thoracic Aortic Aneurysm      History of Present Illness: Clayton Hernandez is a 49 y.o. male who presents for follow up of a thoracic ascending aneurysm.  He moved here from [place].  He has had a complicated history but per diverticulum that required surgery.  He subsequently was found to have small colon cancers that were resected.  During all of this he was found to have an enlarged thoracic aorta.  There is CT from last 2019 and it was 4.4 cm.  There was no calcium noted in his coronaries at that time.  He had a bovine aortic arch variant.  He was apparently having some symptoms of chest discomfort before he moved from [place] and reports having had a [employer] that he says was negative. His aorta was 4.5 cm in Nov after our first  Visit.  He also complained of some leg pain.   He had normal ABIs.    Since I last saw him he has been seen for his sleep apnea and is being managed for this.  I did reduce his beta-blocker.  Both of these interventions seem to have made a big difference in his fatigue.  This is improved.  He is not exercising however because of the virus and he has gained weight.  Last week he had an episode of leg swelling that came and went without clear etiology other than the fact that he was on his feet more as he was moving.  He said he did not have any increased salt intake.  He did have a change in his breathing although he is chronically gotten some dyspnea that has been slowly worse over time.  He says this has been progressive as he has gained weight and has not exercised as much.  He is not describing PND or orthopnea.  Not describing chest pressure, neck or arm discomfort.   Past Medical History:    Diagnosis Date  . Ascending aortic aneurysm (HCC)    4.7 cm, most recent imaging was 12/2018->stable.  . Chronic renal insufficiency, stage 3 (moderate)    GFR 55 ml/min  . Cirrhosis, nonalcoholic (HCC)    NAFLD  . Diverticulitis   . Essential hypertension   . Frequent headaches   . GERD (gastroesophageal reflux disease)   . History of colon cancer 2015   Surgery; but no chemo or rad was required.  Had a total of 5 surgeries, hx of colostomy, then an ileostomy.  [name] Hx of migraines   . Hyperlipemia, mixed   . Insomnia   . Lateral epicondylitis    Recurrent, right: steroid injection by Dr. Junius Roads Sept and Nov 2020.  . Mild intermittent asthma   . Obesity, Class I, BMI 30-34.9     Past Surgical History:  Procedure Laterality Date  . ABI's  03/2019   Normal  . COLON SURGERY  2015   2 cm colon ca excised.  . COLONOSCOPY  many   he has had colonoscopy q28mo since 2015.  Most recent was approx 04/2018->normal.  . COLOSTOMY  2015   diverticulitis   . ILEOSTOMY  2016     Current Outpatient Medications  Medication Sig Dispense  Refill  . ALBUTEROL SULFATE HFA IN Inhale into the lungs as needed.    [name] amLODipine (NORVASC) 10 MG tablet Take 1 tablet (10 mg total) by mouth daily. 90 tablet 3  . metoprolol succinate (TOPROL-XL) 25 MG 24 hr tablet Take 1 tablet (25 mg total) by mouth daily. Take with or immediately following a meal. 90 tablet 3  . omeprazole (PRILOSEC OTC) 20 MG tablet Take 1 tablet (20 mg total) by mouth daily. 90 tablet 3  . rosuvastatin (CRESTOR) 20 MG tablet Take 1 tablet (20 mg total) by mouth daily. 90 tablet 3  . valsartan (DIOVAN) 160 MG tablet Take 1 tablet (160 mg total) by mouth daily. 90 tablet 3   No current facility-administered medications for this visit.    Allergies:   Morphine and related, Penicillins, and Toradol [ketorolac tromethamine]     ROS:  Please see the history of present illness.   Otherwise, review of systems are positive for none.   All  other systems are reviewed and negative.    PHYSICAL EXAM: VS:  BP 122/78   Pulse 61   Temp (!) 97.1 F (36.2 C) Comment: Forehead  Ht 6' 1.5" (1.867 m)   Wt 235 lb 9.6 oz (106.9 kg)   SpO2 97%   BMI 30.66 kg/m  , BMI Body mass index is 30.66 kg/m. GENERAL:  Well appearing NECK:  No jugular venous distention, waveform within normal limits, carotid upstroke brisk and symmetric, no bruits, no thyromegaly LUNGS:  Clear to auscultation bilaterally CHEST:  Unremarkable HEART:  PMI not displaced or sustained,S1 and S2 within normal limits, no S3, no S4, no clicks, no rubs, no murmurs ABD:  Flat, positive bowel sounds normal in frequency in pitch, no bruits, no rebound, no guarding, no midline pulsatile mass, no hepatomegaly, no splenomegaly EXT:  2 plus pulses throughout, no edema, no cyanosis no clubbing   EKG:  EKG is not ordered today.   Recent Labs: 01/30/2019: ALT 29; BUN 27; Creatinine, Ser 1.35; Hemoglobin 16.1; Platelets 121.0; Potassium 3.9; Sodium 139    Lipid Panel    Component Value Date/Time   CHOL 137 01/30/2019 1016   TRIG 156.0 (H) 01/30/2019 1016   HDL 39.00 (L) 01/30/2019 1016   CHOLHDL 4 01/30/2019 1016   VLDL 31.2 01/30/2019 1016   LDLCALC 67 01/30/2019 1016      Wt Readings from Last 3 Encounters:  06/09/19 235 lb 9.6 oz (106.9 kg)  02/03/19 224 lb 3.2 oz (101.7 kg)  01/29/19 222 lb 9.6 oz (101 kg)      Other studies Reviewed: Additional studies/ records that were reviewed today include: Labs, CT Review of the above records demonstrates:  See elsewhere    ASSESSMENT AND PLAN:  THORACIC AORTIC ANEURYSM:    This has been stable and will be followed again next year.     INSOMNIA:     This is improved.  He now is managed with CPAP.  No change in therapy.   FATIGUE:     As above.  He does notice that if he does not take his metoprolol at palpitations still he will remain on the dose as listed.  We talked about this today.  DYSLIPIDEMIA:   He was  noted to be on less medication.  I looked at his CT and there is no suggestion of coronary calcium.  We are going to get the records from [place] but he said he had normal coronaries.  I think he could  have much less in the way of hyperlipidemia therapy particularly as we discussed diet.  Because of this and his nonalcoholic cirrhosis I plan to stop the fenofibrate and perhaps over time switch or stop his statin.  He will get a lipid profile in 6 weeks after stopping his fenofibrate.  HTN:    His blood pressure is well controlled.  No change in therapy.    Current medicines are reviewed at length with the patient today.  The patient does not have concerns regarding medicines.  The following changes have been made:  As above  Labs/ tests ordered today include:   Orders Placed This Encounter  Procedures  . Lipid panel     Disposition:   FU with me in four months.     Signed, [name], MD  06/09/2019 9:42 AM    [facility]

## 2019-06-09 ENCOUNTER — Encounter: Payer: Self-pay | Admitting: Cardiology

## 2019-06-09 ENCOUNTER — Other Ambulatory Visit: Payer: Self-pay

## 2019-06-09 ENCOUNTER — Ambulatory Visit (INDEPENDENT_AMBULATORY_CARE_PROVIDER_SITE_OTHER): Payer: PRIVATE HEALTH INSURANCE | Admitting: Cardiology

## 2019-06-09 VITALS — BP 122/78 | HR 61 | Temp 97.1°F | Ht 73.5 in | Wt 235.6 lb

## 2019-06-09 DIAGNOSIS — E785 Hyperlipidemia, unspecified: Secondary | ICD-10-CM

## 2019-06-09 DIAGNOSIS — Z7189 Other specified counseling: Secondary | ICD-10-CM

## 2019-06-09 DIAGNOSIS — I7121 Aneurysm of the ascending aorta, without rupture: Secondary | ICD-10-CM

## 2019-06-09 DIAGNOSIS — I10 Essential (primary) hypertension: Secondary | ICD-10-CM

## 2019-06-09 DIAGNOSIS — I712 Thoracic aortic aneurysm, without rupture: Secondary | ICD-10-CM | POA: Diagnosis not present

## 2019-06-09 NOTE — Patient Instructions (Addendum)
Medication Instructions:  Stop Fenofibrate *If you need a refill on your cardiac medications before your next appointment, please call your pharmacy*  Lab Work: Your physician recommends that you return for lab work in 6 weeks (07/21/19) (lipids)  If you have labs (blood work) drawn today and your tests are completely normal, you will receive your results only by: [name] MyChart Message (if you have MyChart) OR . A paper copy in the mail If you have any lab test that is abnormal or we need to change your treatment, we will call you to review the results.  Testing/Procedures: None  Follow-Up: At [facility], you and your health needs are our priority.  As part of our continuing mission to provide you with exceptional heart care, we have created designated Provider Care Teams.  These Care Teams include your primary Cardiologist (physician) and Advanced Practice Providers (APPs -  Physician Assistants and Nurse Practitioners) who all work together to provide you with the care you need, when you need it.  Your next appointment:   4 month(s)  The format for your next appointment:   In Person  Provider:   [name], MD

## 2019-06-10 ENCOUNTER — Encounter: Payer: Self-pay | Admitting: Family Medicine

## 2019-07-01 ENCOUNTER — Other Ambulatory Visit (INDEPENDENT_AMBULATORY_CARE_PROVIDER_SITE_OTHER): Payer: PRIVATE HEALTH INSURANCE

## 2019-07-01 DIAGNOSIS — I10 Essential (primary) hypertension: Secondary | ICD-10-CM | POA: Diagnosis not present

## 2019-08-01 ENCOUNTER — Encounter: Payer: PRIVATE HEALTH INSURANCE | Admitting: Family Medicine

## 2019-09-03 ENCOUNTER — Other Ambulatory Visit: Payer: Self-pay

## 2019-09-03 ENCOUNTER — Encounter: Payer: Self-pay | Admitting: Family Medicine

## 2019-09-03 ENCOUNTER — Ambulatory Visit (INDEPENDENT_AMBULATORY_CARE_PROVIDER_SITE_OTHER): Payer: PRIVATE HEALTH INSURANCE | Admitting: Family Medicine

## 2019-09-03 DIAGNOSIS — M25521 Pain in right elbow: Secondary | ICD-10-CM

## 2019-09-03 NOTE — Progress Notes (Signed)
Office Visit Note   Patient: Clayton Hernandez           Date of Birth: 17-Apr-1971           MRN: [id number] Visit Date: 09/03/2019 Requested by: Clayton Sou, MD [address] [place],   [address] PCP: Clayton Sou, MD  Subjective: Chief Complaint  Patient presents with  . Right Elbow - Pain    Requesting another cortisone injection. Pain flared up again in lateral aspect 1 week ago. Last injection was 04/25/2019.    HPI: He is here with recurrent right elbow pain.  Last injection helped until about a week ago.  He has been very physically active.  He is requesting another injection.              ROS:   All other systems were reviewed and are negative.  Objective: Vital Signs: There were no vitals taken for this visit.  Physical Exam:  General:  Alert and oriented, in no acute distress. Pulm:  Breathing unlabored. Psy:  Normal mood, congruent affect.  Right elbow: No effusion, full range of motion.  Point tender at the common extensor tendon at the lateral epicondyle.  No pain at the radial tunnel.  Pain with wrist extension and third finger extension against resistance.  Imaging: No results found.  Assessment & Plan: 1.  Recurrent right elbow lateral epicondylitis -Steroid injection again today.  Follow-up as needed.  Could contemplate dextrose prolotherapy or PRP in the future.     Procedures: Right elbow steroid injection: After sterile prep with Betadine, injected 3 cc 1% lidocaine without epinephrine and 40 mg methylprednisolone into the area of maximum tenderness at the common extensor tendon.  Complete pain relief during the anesthetic phase.    PMFS History: Patient Active Problem List   Diagnosis Date Noted  . Ascending aortic aneurysm ([place]) 06/07/2019  . Essential hypertension 06/07/2019  . Educated about COVID-19 virus infection 06/07/2019   Past Medical History:  Diagnosis Date  . Ascending aortic aneurysm (HCC)    4.7 cm,  most recent imaging was 12/2018->stable.  . Chronic renal insufficiency, stage 3 (moderate)    GFR 55 ml/min  . Cirrhosis, nonalcoholic (HCC)    NAFLD  . Diverticulitis   . Essential hypertension   . Frequent headaches   . GERD (gastroesophageal reflux disease)   . History of colon cancer 2015   Surgery; but no chemo or rad was required.  Had a total of 5 surgeries, hx of colostomy, then an ileostomy.  [name] Hx of migraines   . Hyperlipemia, mixed    Dr. Percival Hernandez d/c'd his fibrate and is considering switch/discontinuation of his statin (as of 05/2019)  . Insomnia   . Lateral epicondylitis    Recurrent, right: steroid injection by Dr. Junius Hernandez Sept and Nov 2020.  . Mild intermittent asthma   . Obesity, Class I, BMI 30-34.9     Family History  Problem Relation Age of Onset  . Lymphoma Mother   . Cancer Father        ureter  . Stomach cancer Father   . Heart disease Father 70       Stents  . High blood pressure Father   . High Cholesterol Father   . Thyroid cancer Sister     Past Surgical History:  Procedure Laterality Date  . ABI's  03/2019   Normal  . COLON SURGERY  2015   2 cm colon ca excised.  [name]  COLONOSCOPY  many   he has had colonoscopy q66mo since 2015.  Most recent was approx 04/2018->normal.  . COLOSTOMY  2015   diverticulitis   . ILEOSTOMY  2016   Social History   Occupational History  . Not on file  Tobacco Use  . Smoking status: Former Smoker    Types: Cigarettes    Quit date: 12/31/1988    Years since quitting: 30.6  . Smokeless tobacco: Never Used  Substance and Sexual Activity  . Alcohol use: Yes    Comment: rarely  . Drug use: Not on file  . Sexual activity: Not on file

## 2019-10-08 NOTE — Progress Notes (Signed)
Cardiology Office Note   Date:  10/09/2019    ID:  Clayton Hernandez, DOB 15-Aug-1970, MRN [id number]  PCP:  [name], MD  Cardiologist:   [name], MD Referring:  [name], MD  Chief Complaint  Patient presents with  . Thoracic Aortic Aneurysm      History of Present Illness: Clayton Hernandez is a 49 y.o. male who presents for follow up of a thoracic ascending aneurysm.  He moved here from [place].  He has had a complicated history but per diverticulum that required surgery.  He subsequently was found to have small colon cancers that were resected.  During all of this he was found to have an enlarged thoracic aorta.  There is CT from last 2019 and it was 4.4 cm.  There was no calcium noted in his coronaries at that time.  He had a bovine aortic arch variant.  The aorta was 4.5 cm in November of last year.   He has been doing very well.  He is lost 20 pounds since February because he is eating better and exercising routinely.  The patient denies any new symptoms such as chest discomfort, neck or arm discomfort. There has been no new shortness of breath, PND or orthopnea. There have been no reported palpitations, presyncope or syncope.     Past Medical History:  Diagnosis Date  . Ascending aortic aneurysm (HCC)    4.7 cm, most recent imaging was 12/2018->stable.  . Chronic renal insufficiency, stage 3 (moderate)    GFR 55 ml/min  . Cirrhosis, nonalcoholic (HCC)    NAFLD  . Diverticulitis   . Essential hypertension   . Frequent headaches   . GERD (gastroesophageal reflux disease)   . History of colon cancer 2015   Surgery; but no chemo or rad was required.  Had a total of 5 surgeries, hx of colostomy, then an ileostomy.  [name] Hx of migraines   . Hyperlipemia, mixed    Dr. Percival Spanish d/c'd his fibrate and is considering switch/discontinuation of his statin (as of 05/2019)  . Insomnia   . Lateral epicondylitis    Recurrent, right: steroid injection  by Dr. Junius Roads Sept and Nov 2020.  . Mild intermittent asthma   . Obesity, Class I, BMI 30-34.9     Past Surgical History:  Procedure Laterality Date  . ABI's  03/2019   Normal  . COLON SURGERY  2015   2 cm colon ca excised.  . COLONOSCOPY  many   he has had colonoscopy q17mo since 2015.  Most recent was approx 04/2018->normal.  . COLOSTOMY  2015   diverticulitis   . ILEOSTOMY  2016     Current Outpatient Medications  Medication Sig Dispense Refill  . ALBUTEROL SULFATE HFA IN Inhale into the lungs as needed.    [name] amLODipine (NORVASC) 10 MG tablet Take 1 tablet (10 mg total) by mouth daily. 90 tablet 3  . metoprolol succinate (TOPROL-XL) 25 MG 24 hr tablet Take 1 tablet (25 mg total) by mouth daily. Take with or immediately following a meal. 90 tablet 3  . omeprazole (PRILOSEC OTC) 20 MG tablet Take 1 tablet (20 mg total) by mouth daily. 90 tablet 3  . rosuvastatin (CRESTOR) 20 MG tablet Take 1 tablet (20 mg total) by mouth daily. 90 tablet 3  . valsartan (DIOVAN) 160 MG tablet Take 1 tablet (160 mg total) by mouth daily. 90 tablet 3   No current facility-administered medications for this  visit.    Allergies:   Morphine and related, Penicillins, and Toradol [ketorolac tromethamine]     ROS:  Please see the history of present illness.   Otherwise, review of systems are positive for none.   All other systems are reviewed and negative.    PHYSICAL EXAM: VS:  BP 111/71   Pulse (!) 59   Ht 6\' 2"  (1.88 m)   Wt 210 lb 9.6 oz (95.5 kg)   SpO2 99%   BMI 27.04 kg/m  , BMI Body mass index is 27.04 kg/m. GENERAL:  Well appearing NECK:  No jugular venous distention, waveform within normal limits, carotid upstroke brisk and symmetric, no bruits, no thyromegaly LUNGS:  Clear to auscultation bilaterally CHEST:  Unremarkable HEART:  PMI not displaced or sustained,S1 and S2 within normal limits, no S3, no S4, no clicks, no rubs, no murmurs ABD:  Flat, positive bowel sounds normal in  frequency in pitch, no bruits, no rebound, no guarding, no midline pulsatile mass, no hepatomegaly, no splenomegaly EXT:  2 plus pulses throughout, no edema, no cyanosis no clubbing   EKG:  EKG is not ordered today.   Recent Labs: 01/30/2019: ALT 29; BUN 27; Creatinine, Ser 1.35; Hemoglobin 16.1; Platelets 121.0; Potassium 3.9; Sodium 139    Lipid Panel    Component Value Date/Time   CHOL 137 01/30/2019 1016   TRIG 156.0 (H) 01/30/2019 1016   HDL 39.00 (L) 01/30/2019 1016   CHOLHDL 4 01/30/2019 1016   VLDL 31.2 01/30/2019 1016   LDLCALC 67 01/30/2019 1016      Wt Readings from Last 3 Encounters:  10/09/19 210 lb 9.6 oz (95.5 kg)  06/09/19 235 lb 9.6 oz (106.9 kg)  02/03/19 224 lb 3.2 oz (101.7 kg)      Other studies Reviewed: Additional studies/ records that were reviewed today include: None Review of the above records demonstrates:  NA   ASSESSMENT AND PLAN:  THORACIC AORTIC ANEURYSM:      This has been stable and will be followed again in Nov.   INSOMNIA:    He uses CPAP.  This is improved since he lost weight.   FATIGUE:    His fatigue is improved with exercise, weight loss and better probable response to CPAP.  DYSLIPIDEMIA:     LDL was as above.  He will continue meds as listed.  Interestingly when he skips his Crestor his legs swell.  HTN:    His blood pressure is at target.  No change in therapy.   COVID EDUCATION:  He has been vaccinated.     Current medicines are reviewed at length with the patient today.  The patient does not have concerns regarding medicines.  The following changes have been made:  None  Labs/ tests ordered today include: None  Orders Placed This Encounter  Procedures  . CT ANGIO CHEST AORTA W/CM & OR WO/CM     Disposition:   FU with me in 12 months.     Signed, [name], MD  10/09/2019 9:44 AM    [facility]

## 2019-10-09 ENCOUNTER — Encounter: Payer: Self-pay | Admitting: Cardiology

## 2019-10-09 ENCOUNTER — Ambulatory Visit (INDEPENDENT_AMBULATORY_CARE_PROVIDER_SITE_OTHER): Payer: PRIVATE HEALTH INSURANCE | Admitting: Cardiology

## 2019-10-09 ENCOUNTER — Other Ambulatory Visit: Payer: Self-pay

## 2019-10-09 VITALS — BP 111/71 | HR 59 | Ht 74.0 in | Wt 210.6 lb

## 2019-10-09 DIAGNOSIS — I10 Essential (primary) hypertension: Secondary | ICD-10-CM | POA: Diagnosis not present

## 2019-10-09 DIAGNOSIS — Z7189 Other specified counseling: Secondary | ICD-10-CM

## 2019-10-09 DIAGNOSIS — I712 Thoracic aortic aneurysm, without rupture, unspecified: Secondary | ICD-10-CM

## 2019-10-09 DIAGNOSIS — I7121 Aneurysm of the ascending aorta, without rupture: Secondary | ICD-10-CM

## 2019-10-09 NOTE — Patient Instructions (Signed)
Medication Instructions:  Your physician recommends that you continue on your current medications as directed. Please refer to the Current Medication list given to you today.  *If you need a refill on your cardiac medications before your next appointment, please call your pharmacy*    Testing/Procedures: CT Angio Chest/Aorta--in November   Follow-Up: At [facility], you and your health needs are our priority.  As part of our continuing mission to provide you with exceptional heart care, we have created designated Provider Care Teams.  These Care Teams include your primary Cardiologist (physician) and Advanced Practice Providers (APPs -  Physician Assistants and Nurse Practitioners) who all work together to provide you with the care you need, when you need it.  We recommend signing up for the patient portal called "MyChart".  Sign up information is provided on this After Visit Summary.  MyChart is used to connect with patients for Virtual Visits (Telemedicine).  Patients are able to view lab/test results, encounter notes, upcoming appointments, etc.  Non-urgent messages can be sent to your provider as well.   To learn more about what you can do with MyChart, go to [email or web address].    Your next appointment:   12 month(s)  The format for your next appointment:   In Person  Provider:   [name], MD   Other Instructions Please call our office 2 months in advance to schedule your follow-up appointment with Dr. Percival Spanish.

## 2019-10-09 NOTE — Addendum Note (Signed)
Addended by: Therisa Doyne on: 10/09/2019 09:50 AM   Modules accepted: Orders

## 2019-11-18 ENCOUNTER — Encounter: Payer: Self-pay | Admitting: Family Medicine

## 2019-11-18 ENCOUNTER — Ambulatory Visit (INDEPENDENT_AMBULATORY_CARE_PROVIDER_SITE_OTHER): Payer: PRIVATE HEALTH INSURANCE | Admitting: Family Medicine

## 2019-11-18 ENCOUNTER — Other Ambulatory Visit: Payer: Self-pay

## 2019-11-18 DIAGNOSIS — M25521 Pain in right elbow: Secondary | ICD-10-CM

## 2019-11-18 NOTE — Progress Notes (Signed)
Office Visit Note   Patient: Clayton Hernandez           Date of Birth: 1971-02-25           MRN: [id number] Visit Date: 11/18/2019 Requested by: [name], MD [address] [place],  [place] [address] PCP: [name], MD  Subjective: Chief Complaint  Patient presents with   Right Elbow - Pain    Requests another injection. Has had 2 cortisone injections this year, January and May. The last injection "never took." Has continued to have pain in the lateral aspect of his elbow.    HPI: He is here with recurrent right elbow pain.  Last injection unfortunately did not give prolonged relief.  Ongoing pain lateral elbow with some radiation toward the wrist.               ROS:   All other systems were reviewed and are negative.  Objective: Vital Signs: There were no vitals taken for this visit.  Physical Exam:  General:  Alert and oriented, in no acute distress. Pulm:  Breathing unlabored. Psy:  Normal mood, congruent affect.  Right elbow: Full active range of motion.  He has pain with wrist extension, third finger extension, and forearm pronation/supination against resistance.  He is point tender at the common extensor tendon at the lateral epicondyle.  Slight tenderness near the radial tunnel.   Imaging: No results found.  Assessment & Plan: 1. Chronic right elbow lateral epicondylitis -Discussed options with him, he wants 1 more cortisone injection.  If he fails to get long-term relief, then possibly dextrose prolotherapy.     Procedures: Right elbow injection: After sterile prep with Betadine, injected 3 cc 1% lidocaine without epinephrine and 20 mg methylprednisolone into the area of maximum tenderness at the common extensor tendon.  He had complete relief during the immediate anesthetic phase.    PMFS History: Patient Active Problem List   Diagnosis Date Noted   Ascending aortic aneurysm ([place]) 06/07/2019   Essential hypertension 06/07/2019    Educated about COVID-19 virus infection 06/07/2019   Past Medical History:  Diagnosis Date   Ascending aortic aneurysm (HCC)    4.7 cm, most recent imaging was 12/2018->stable.   Chronic renal insufficiency, stage 3 (moderate)    GFR 55 ml/min   Cirrhosis, nonalcoholic (HCC)    NAFLD   Diverticulitis    Essential hypertension    Frequent headaches    GERD (gastroesophageal reflux disease)    History of colon cancer 2015   Surgery; but no chemo or rad was required.  Had a total of 5 surgeries, hx of colostomy, then an ileostomy.   Hx of migraines    Hyperlipemia, mixed    Dr. Percival Spanish d/c'd his fibrate and is considering switch/discontinuation of his statin (as of 05/2019)   Insomnia    Lateral epicondylitis    Recurrent, right: steroid injection by Dr. Junius Roads Sept and Nov 2020.   Mild intermittent asthma    Obesity, Class I, BMI 30-34.9     Family History  Problem Relation Age of Onset   Lymphoma Mother    Cancer Father        ureter   Stomach cancer Father    Heart disease Father 40       Stents   High blood pressure Father    High Cholesterol Father    Thyroid cancer Sister     Past Surgical History:  Procedure Laterality Date  ABI's  03/2019   Normal   COLON SURGERY  2015   2 cm colon ca excised.   COLONOSCOPY  many   he has had colonoscopy q73mo since 2015.  Most recent was approx 04/2018->normal.   COLOSTOMY  2015   diverticulitis    ILEOSTOMY  2016   Social History   Occupational History   Not on file  Tobacco Use   Smoking status: Former Smoker    Types: Cigarettes    Quit date: 12/31/1988    Years since quitting: 30.9   Smokeless tobacco: Never Used  Substance and Sexual Activity   Alcohol use: Yes    Comment: rarely   Drug use: Not on file   Sexual activity: Not on file

## 2020-02-02 ENCOUNTER — Encounter: Payer: Self-pay | Admitting: Family Medicine

## 2020-02-02 ENCOUNTER — Ambulatory Visit: Payer: Self-pay

## 2020-02-02 ENCOUNTER — Other Ambulatory Visit: Payer: Self-pay

## 2020-02-02 ENCOUNTER — Ambulatory Visit (INDEPENDENT_AMBULATORY_CARE_PROVIDER_SITE_OTHER): Payer: PRIVATE HEALTH INSURANCE | Admitting: Family Medicine

## 2020-02-02 DIAGNOSIS — M25521 Pain in right elbow: Secondary | ICD-10-CM | POA: Diagnosis not present

## 2020-02-02 NOTE — Progress Notes (Signed)
Office Visit Note   Patient: Clayton Hernandez           Date of Birth: 1970-12-16           MRN: [id number] Visit Date: 02/02/2020 Requested by: [name], MD [address] [place],  [place] [address] PCP: [name], MD  Subjective: Chief Complaint  Patient presents with  . Right Elbow - Pain    The pain is an 11 out of a 10 since last week. Pain into the hands. Continues to have numbness in the hand along with warmth.    HPI: He is here with recurrent right elbow pain.  Lateral epicondyle injections are only getting temporary relief at best.  This time it only lasted about 6 weeks, and the pain relief was not complete.  Now his pain is "11 out of 10" and he is noticing a numbness and tingling sensation between the fourth and fifth fingers mostly on the dorsum.               ROS:   All other systems were reviewed and are negative.  Objective: Vital Signs: There were no vitals taken for this visit.  Physical Exam:  General:  Alert and oriented, in no acute distress. Pulm:  Breathing unlabored. Psy:  Normal mood, congruent affect.  Right elbow: Full active range of motion, pain at the extreme of extension.  He is point tender at the common extensor tendon at the lateral epicondyle.  He also has tenderness at the radial tunnel.  He has pain with wrist extension and third finger extension against resistance as well as forearm pronation and supination.  Intrinsic hand strength is 5/5 with grossly normal sensation.  Imaging: US Guided Needle Placement  Result Date: 02/02/2020 Limited diagnostic ultrasound of the right elbow reveals chronic tendinopathy changes at the common extensor tendon with probable full-thickness partial tearing on the anterior portion of the tendon at the insertion. Ultrasound-guided right radial tunnel injection: After sterile prep with Betadine, injected 3 cc 1% lidocaine without epinephrine and 6 mg betamethasone.  He had very good relief  during the anesthetic phase with temporary weakness of the wrist with extension.   Assessment & Plan: 1.  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 -Discussed options with him and elected to inject the radial tunnel today for diagnostic and therapeutic purposes.  If this does not give lasting relief, could contemplate dextrose prolotherapy at the common extensor tendon versus surgical consult.  MRI would be another consideration.     Procedures: No procedures performed  No notes on file     PMFS History: Patient Active Problem List   Diagnosis Date Noted  . Ascending aortic aneurysm ([place]) 06/07/2019  . Essential hypertension 06/07/2019  . Educated about COVID-19 virus infection 06/07/2019   Past Medical History:  Diagnosis Date  . Ascending aortic aneurysm (HCC)    4.7 cm, most recent imaging was 12/2018->stable.  . Chronic renal insufficiency, stage 3 (moderate) (HCC)    GFR 55 ml/min  . Cirrhosis, nonalcoholic (HCC)    NAFLD  . Diverticulitis   . Essential hypertension   . Frequent headaches   . GERD (gastroesophageal reflux disease)   . History of colon cancer 2015   Surgery; but no chemo or rad was required.  Had a total of 5 surgeries, hx of colostomy, then an ileostomy.  [name] Hx of migraines   . Hyperlipemia, mixed  Dr. Percival Spanish d/c'd his fibrate and is considering switch/discontinuation of his statin (as of 05/2019)  . Insomnia   . Lateral epicondylitis    Recurrent, right: steroid injection by Dr. Junius Roads Sept and Nov 2020.  . Mild intermittent asthma   . Obesity, Class I, BMI 30-34.9     Family History  Problem Relation Age of Onset  . Lymphoma Mother   . Cancer Father        ureter  . Stomach cancer Father   . Heart disease Father 80       Stents  . High blood pressure Father   . High Cholesterol Father   . Thyroid cancer Sister     Past Surgical History:  Procedure Laterality Date  . ABI's   03/2019   Normal  . COLON SURGERY  2015   2 cm colon ca excised.  . COLONOSCOPY  many   he has had colonoscopy q32mo since 2015.  Most recent was approx 04/2018->normal.  . COLOSTOMY  2015   diverticulitis   . ILEOSTOMY  2016   Social History   Occupational History  . Not on file  Tobacco Use  . Smoking status: Former Smoker    Types: Cigarettes    Quit date: 12/31/1988    Years since quitting: 31.1  . Smokeless tobacco: Never Used  Substance and Sexual Activity  . Alcohol use: Yes    Comment: rarely  . Drug use: Not on file  . Sexual activity: Not on file

## 2020-02-06 LAB — BASIC METABOLIC PANEL
BUN/Creatinine Ratio: 16 (ref 9–20)
BUN: 17 mg/dL (ref 6–24)
CO2: 22 mmol/L (ref 20–29)
Calcium: 8.8 mg/dL (ref 8.7–10.2)
Chloride: 104 mmol/L (ref 96–106)
Creatinine, Ser: 1.09 mg/dL (ref 0.76–1.27)
GFR calc Af Amer: 90 mL/min/{1.73_m2} (ref >59)
GFR calc non Af Amer: 78 mL/min/{1.73_m2} (ref >59)
Glucose: 92 mg/dL (ref 65–99)
Potassium: 3.9 mmol/L (ref 3.5–5.2)
Sodium: 141 mmol/L (ref 134–144)

## 2020-02-19 ENCOUNTER — Ambulatory Visit (INDEPENDENT_AMBULATORY_CARE_PROVIDER_SITE_OTHER)
Admission: RE | Admit: 2020-02-19 | Discharge: 2020-02-19 | Disposition: A | Discharge status: Home / Self Care | Payer: PRIVATE HEALTH INSURANCE | Source: Ambulatory Visit | Attending: Cardiology | Admitting: Cardiology

## 2020-02-19 ENCOUNTER — Other Ambulatory Visit: Payer: Self-pay

## 2020-02-19 DIAGNOSIS — I712 Thoracic aortic aneurysm, without rupture, unspecified: Secondary | ICD-10-CM

## 2020-02-19 HISTORY — DX: Malignant (primary) neoplasm, unspecified: C80.1

## 2020-02-19 MED ORDER — IOHEXOL 350 MG/ML SOLN
100.0000 mL | Freq: Once | INTRAVENOUS | Status: AC | PRN
  Administered 2020-02-19: 100 mL via INTRAVENOUS

## 2020-03-01 ENCOUNTER — Encounter: Payer: Self-pay | Admitting: Family Medicine

## 2020-03-01 ENCOUNTER — Telehealth: Payer: Self-pay | Admitting: *Deleted

## 2020-03-01 DIAGNOSIS — I712 Thoracic aortic aneurysm, without rupture: Secondary | ICD-10-CM

## 2020-03-01 DIAGNOSIS — I7121 Aneurysm of the ascending aorta, without rupture: Secondary | ICD-10-CM

## 2020-03-01 NOTE — Telephone Encounter (Signed)
Noted  

## 2020-03-01 NOTE — Telephone Encounter (Signed)
Referral placed in Epic.

## 2020-03-01 NOTE — Telephone Encounter (Signed)
-----   Message from [name], MD sent at 02/26/2020  1:55 PM EST ----- Discussed results with patient.  I will like to refer him to [facility] to begin to follow his aneurysm which is perhaps mildly larger.  Send results to [place], Adrian Blackwater, MD

## 2020-03-04 ENCOUNTER — Other Ambulatory Visit: Payer: Self-pay | Admitting: Cardiology

## 2020-03-16 ENCOUNTER — Other Ambulatory Visit: Payer: Self-pay | Admitting: Cardiology

## 2020-03-18 ENCOUNTER — Other Ambulatory Visit: Payer: Self-pay

## 2020-03-18 MED ORDER — VALSARTAN 160 MG PO TABS
160.0000 mg | ORAL_TABLET | Freq: Every day | ORAL | 3 refills | Status: DC
Start: 2020-03-18 — End: 2020-04-08

## 2020-03-22 ENCOUNTER — Other Ambulatory Visit: Payer: Self-pay

## 2020-03-22 ENCOUNTER — Encounter: Payer: Self-pay | Admitting: Cardiothoracic Surgery

## 2020-03-22 ENCOUNTER — Other Ambulatory Visit: Payer: Self-pay | Admitting: Cardiothoracic Surgery

## 2020-03-22 ENCOUNTER — Institutional Professional Consult (permissible substitution): Payer: PRIVATE HEALTH INSURANCE | Admitting: Cardiothoracic Surgery

## 2020-03-22 VITALS — BP 124/77 | HR 82 | Resp 18 | Ht 74.0 in | Wt 224.0 lb

## 2020-03-22 DIAGNOSIS — I712 Thoracic aortic aneurysm, without rupture, unspecified: Secondary | ICD-10-CM

## 2020-03-22 NOTE — Progress Notes (Signed)
[address]       [place] [address]             [phone]     CARDIOTHORACIC SURGERY CONSULTATION REPORT  Referring Provider is [name], MD Primary Cardiologist is [name], MD PCP is McGowen, Adrian Blackwater, MD  Chief Complaint  Patient presents with  . Thoracic Aortic Aneurysm    CTA chest 02/19/20  . Consult    Initial surgical consult     HPI:  49 year old gentleman presents for assessment of ascending aortic aneurysm.  The patient has a history of ruptured colonic diverticulum several years ago while he was residing in [place].  He underwent urgent operation.  During his convalescence he had a CT scan which demonstrated an ascending aortic aneurysm measuring approximately 4.3 cm.  Patient moved here approximately a year and a half ago.  He has been followed with Dr. Percival Spanish for this aneurysm.  Looking back at the records this has been growing from the original diameter to approximately 5 cm now.  He has difficult to control high blood pressure and is on 4 different medications.  He endorses chest pain particularly with stress.  He underwent a left heart catheterization 3 years ago which was clean.  Patient has no known family history of aneurysm disease.  However, he does have a extended history of cardiovascular disease with early deaths.  Past Medical History:  Diagnosis Date  . Ascending aortic aneurysm (HCC)    4.7 cm. 12/2018 imaging->stable. 02/2020 CT angio/aortoa slight progression->cardiol ref'd him to CT surg  . Cancer [facility])    colon cancers per doctor's notes  . Chronic renal insufficiency, stage 3 (moderate) (HCC)    GFR 55 ml/min  . Cirrhosis, nonalcoholic (HCC)    NAFLD  . Diverticulitis   . Essential hypertension   . Frequent headaches   . GERD (gastroesophageal reflux disease)   . History of colon cancer 2015   Surgery; but no chemo or rad was required.  Had a total of 5 surgeries, hx of colostomy, then an ileostomy.   [name] Hx of migraines   . Hyperlipemia, mixed    Dr. Percival Spanish d/c'd his fibrate and is considering switch/discontinuation of his statin (as of 05/2019)  . Insomnia   . Lateral epicondylitis    Recurrent, right: steroid injection by Dr. Junius Roads Sept and Nov 2020.  . Mild intermittent asthma   . Obesity, Class I, BMI 30-34.9     Past Surgical History:  Procedure Laterality Date  . ABI's  03/2019   Normal  . COLON SURGERY  2015   2 cm colon ca excised.  . COLONOSCOPY  many   he has had colonoscopy q61mo since 2015.  Most recent was approx 04/2018->normal.  . COLOSTOMY  2015   diverticulitis   . ILEOSTOMY  2016    Family History  Problem Relation Age of Onset  . Lymphoma Mother   . Cancer Father        ureter  . Stomach cancer Father   . Heart disease Father 2       Stents  . High blood pressure Father   . High Cholesterol Father   . Thyroid cancer Sister     Social History   Socioeconomic History  . Marital status: Married    Spouse name: Not on file  . Number of children: Not on file  . Years of education: Not on file  . Highest education  level: Not on file  Occupational History  . Not on file  Tobacco Use  . Smoking status: Former Smoker    Types: Cigarettes    Quit date: 12/31/1988    Years since quitting: 31.2  . Smokeless tobacco: Never Used  Substance and Sexual Activity  . Alcohol use: Yes    Comment: rarely  . Drug use: Not on file  . Sexual activity: Not on file  Other Topics Concern  . Not on file  Social History Narrative   Married, 2 daughters and 1 son.   Relocated from [place] to [place].  Born and raised in [place].  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   No T/A/Ds.   Social Determinants of Health   Financial Resource Strain:   . Difficulty of Paying Living Expenses: Not on file  Food Insecurity:   . Worried About Charity fundraiser in the Last Year: Not  on file  . Ran Out of Food in the Last Year: Not on file  Transportation Needs:   . Lack of Transportation (Medical): Not on file  . Lack of Transportation (Non-Medical): Not on file  Physical Activity:   . Days of Exercise per Week: Not on file  . Minutes of Exercise per Session: Not on file  Stress:   . Feeling of Stress : Not on file  Social Connections:   . Frequency of Communication with Friends and Family: Not on file  . Frequency of Social Gatherings with Friends and Family: Not on file  . Attends Religious Services: Not on file  . Active Member of Clubs or Organizations: Not on file  . Attends Archivist Meetings: Not on file  . Marital Status: Not on file  Intimate Partner Violence:   . Fear of Current or Ex-Partner: Not on file  . Emotionally Abused: Not on file  . Physically Abused: Not on file  . Sexually Abused: Not on file    Current Outpatient Medications  Medication Sig Dispense Refill  . ALBUTEROL SULFATE HFA IN Inhale into the lungs as needed.    [name] amLODipine (NORVASC) 10 MG tablet Take 1 tablet (10 mg total) by mouth daily. 90 tablet 3  . cyanocobalamin (,VITAMIN B-12,) 1000 MCG/ML injection Inject 1,000 mcg into the muscle every 30 (thirty) days.    . metoprolol succinate (TOPROL-XL) 25 MG 24 hr tablet TAKE 1 TABLET (25 MG TOTAL) BY MOUTH DAILY. TAKE WITH OR IMMEDIATELY FOLLOWING A MEAL. 90 tablet 3  . omeprazole (PRILOSEC) 20 MG capsule TAKE 1 CAPSULE BY MOUTH EVERY DAY 90 capsule 3  . rosuvastatin (CRESTOR) 20 MG tablet Take 1 tablet (20 mg total) by mouth daily. 90 tablet 3  . valsartan (DIOVAN) 160 MG tablet Take 1 tablet (160 mg total) by mouth daily. 90 tablet 3   No current facility-administered medications for this visit.    Allergies  Allergen Reactions  . Morphine And Related Other (See Comments)    "all opiates" - "they'll kill me"  . Penicillins Anaphylaxis  . Toradol [Ketorolac Tromethamine]       Review of  Systems:   General:  Reduced energy level and no change in weight  Cardiac:  Does experience chest pain and palpitations  Respiratory:  Endorses dyspnea on exertion  GI:   History of ruptured diverticulum with Hartmann's procedure and then reconnection.  GU:   Denies kidney disease  Vascular:  Denies vascular disease  Neuro:   No history of stroke or TIAs  Musculoskeletal: History of multiple skeletal injuries  Skin:   Negative  Psych:   Positive unusual recent stress  Eyes:   Negative  ENT:   Negative  Hematologic:  Negative  Endocrine:  No diabetes, no thyroid disorders     Physical Exam:   BP 124/77 (BP Location: Right Arm, Patient Position: Sitting)   Pulse 82   Resp 18   Ht 6\' 2"  (1.88 m)   Wt 101.6 kg   SpO2 99% Comment: RA with mask on  BMI 28.76 kg/m   General:  well-appearing  HEENT:  Unremarkable   Neck:   no JVD, no bruits, no adenopathy  Chest:   clear to auscultation, symmetrical breath sounds, no wheezes, no rhonchi   CV:   RRR, no detectable murmur   Abdomen:  Multiple well-healed incisions  Extremities:  warm, well-perfused, pulses intact throughout, no LE edema  Rectal/GU  Deferred  Neuro:   Grossly non-focal and symmetrical throughout  Skin:   Clean and dry, no rashes, no breakdown   Diagnostic Tests:  I personally reviewed his available imaging studies and agree with their interpretation; the maximal diameter of the ascending aorta is approximately 5 cm which has grown from 4.2 to 4.3 cm   Impression:  49 year old gentleman with a recognized ascending aortic aneurysm which is growing in diameter over the last 2 to 3 years.  In addition to this he has high blood pressure and some evidence of connective tissue disorder.  I would suggest elective intervention with replacement of the ascending aorta in short order   Plan:  Left heart catheterization for preoperative clearance and to assess chest pain Transthoracic echocardiogram to assess LV  function and aortic valve function The patient will call for an operative date in the near future   I spent in excess of 40 minutes during the conduct of this office consultation and >50% of this time involved direct face-to-face encounter with the patient for counseling and/or coordination of their care.          Level 3 Office Consult = 40 minutes         Level 4 Office Consult = 60 minutes         Level 5 Office Consult = 80 minutes  B.  Murvin Natal, MD 03/22/2020 10:44 AM

## 2020-03-23 ENCOUNTER — Ambulatory Visit (HOSPITAL_COMMUNITY)
Admission: RE | Admit: 2020-03-23 | Discharge: 2020-03-23 | Disposition: A | Discharge status: Home / Self Care | Payer: PRIVATE HEALTH INSURANCE | Source: Ambulatory Visit | Attending: Cardiothoracic Surgery | Admitting: Cardiothoracic Surgery

## 2020-03-23 DIAGNOSIS — E785 Hyperlipidemia, unspecified: Secondary | ICD-10-CM | POA: Insufficient documentation

## 2020-03-23 DIAGNOSIS — I119 Hypertensive heart disease without heart failure: Secondary | ICD-10-CM | POA: Diagnosis not present

## 2020-03-23 DIAGNOSIS — I712 Thoracic aortic aneurysm, without rupture, unspecified: Secondary | ICD-10-CM

## 2020-03-23 HISTORY — PX: TRANSTHORACIC ECHOCARDIOGRAM: SHX275

## 2020-03-23 LAB — ECHOCARDIOGRAM COMPLETE
Area-P 1/2: 1.84 cm2
S' Lateral: 3.7 cm

## 2020-03-23 NOTE — Progress Notes (Signed)
Echocardiogram 2D Echocardiogram has been performed.  Oneal Deputy Brighton Delio 03/23/2020, 8:43 AM

## 2020-03-25 DIAGNOSIS — E785 Hyperlipidemia, unspecified: Secondary | ICD-10-CM | POA: Insufficient documentation

## 2020-03-25 NOTE — H&P (View-Only) (Signed)
Cardiology Office Note   Date:  03/26/2020    ID:  [name], DOB Apr 28, 1970, MRN [id number]  PCP:  [name], MD  Cardiologist:   [name], MD Referring:  [name], MD  Chief Complaint  Patient presents with  . Thoracic Aortic Aneurysm      History of Present Illness: [place] Hernandez is a 49 y.o. male who presents for follow up of a thoracic ascending aneurysm.  He moved here from [place].  He has had a complicated history but per diverticulum that required surgery.  He subsequently was found to have small colon cancers that were resected.  During all of this he was found to have an enlarged thoracic aorta.  There is CT from last 2019 and it was 4.4 cm.  There was no calcium noted in his coronaries at that time.  He had a bovine aortic arch variant.  The aorta was 4.5 cm in November of last year.   It was increased to 4.7 cm.  Dr. [name] has suggested elective aortic replacement.    The patient has multiple complaints.  He is very anxious.  He describes fatigue.  He says palpitations.  He has had chest discomfort.  He points under his left axillary area.  This seems to come and go.  He cannot do much activity because he gets short of breath and also might exacerbate some of this chest discomfort.  He is not having any neck discomfort.  He does have some radiation down his left arm.  He is not having any PND or orthopnea.  Has had no new palpitations, presyncope or syncope.   Past Medical History:  Diagnosis Date  . Ascending aortic aneurysm (HCC)    4.7 cm. 12/2018 imaging->stable. 02/2020 CT angio/aortoa slight progression->cardiol ref'd him to CT surg  . Cancer [facility])    colon cancers per doctor's notes  . Chronic renal insufficiency, stage 3 (moderate) (HCC)    GFR 55 ml/min  . Cirrhosis, nonalcoholic (HCC)    NAFLD  . Diverticulitis   . Essential hypertension   . Frequent headaches   . GERD (gastroesophageal reflux disease)   .  History of colon cancer 2015   Surgery; but no chemo or rad was required.  Had a total of 5 surgeries, hx of colostomy, then an ileostomy.  [name] Hx of migraines   . Hyperlipemia, mixed    Dr. Percival Spanish d/c'd his fibrate and is considering switch/discontinuation of his statin (as of 05/2019)  . Insomnia   . Lateral epicondylitis    Recurrent, right: steroid injection by Dr. Junius Roads Sept and Nov 2020.  . Mild intermittent asthma   . Obesity, Class I, BMI 30-34.9     Past Surgical History:  Procedure Laterality Date  . ABI's  03/2019   Normal  . COLON SURGERY  2015   2 cm colon ca excised.  . COLONOSCOPY  many   he has had colonoscopy q67mo since 2015.  Most recent was approx 04/2018->normal.  . COLOSTOMY  2015   diverticulitis   . ILEOSTOMY  2016     Current Outpatient Medications  Medication Sig Dispense Refill  . albuterol (VENTOLIN HFA) 108 (90 Base) MCG/ACT inhaler Inhale 2 puffs into the lungs every 6 (six) hours as needed for wheezing or shortness of breath.    [name] amLODipine (NORVASC) 10 MG tablet Take 1 tablet (10 mg total) by mouth daily. 90 tablet 3  . cetirizine (ZYRTEC) 10  MG tablet Take 10 mg by mouth daily as needed for allergies.    . cyanocobalamin (,VITAMIN B-12,) 1000 MCG/ML injection Inject 1,000 mcg into the muscle every 30 (thirty) days.    . metoprolol succinate (TOPROL-XL) 25 MG 24 hr tablet TAKE 1 TABLET (25 MG TOTAL) BY MOUTH DAILY. TAKE WITH OR IMMEDIATELY FOLLOWING A MEAL. 90 tablet 3  . omeprazole (PRILOSEC) 20 MG capsule TAKE 1 CAPSULE BY MOUTH EVERY DAY (Patient taking differently: Take 20 mg by mouth daily.) 90 capsule 3  . rosuvastatin (CRESTOR) 20 MG tablet Take 1 tablet (20 mg total) by mouth daily. (Patient taking differently: Take 20 mg by mouth daily in the afternoon.) 90 tablet 3  . valsartan (DIOVAN) 160 MG tablet Take 1 tablet (160 mg total) by mouth daily. 90 tablet 3  . aspirin EC 81 MG tablet Take 1 tablet (81 mg total) by mouth daily. Swallow whole.  90 tablet 3   No current facility-administered medications for this visit.    Allergies:   Morphine and related, Penicillins, and Toradol [ketorolac tromethamine]    ROS:  Please see the history of present illness.   Otherwise, review of systems are positive for none.   All other systems are reviewed and negative.    PHYSICAL EXAM: VS:  BP 114/72   Pulse 70   Ht 6\' 2"  (1.88 m)   Wt 223 lb (101.2 kg)   BMI 28.63 kg/m  , BMI Body mass index is 28.63 kg/m. GENERAL:  Well appearing NECK:  No jugular venous distention, waveform within normal limits, carotid upstroke brisk and symmetric, no bruits, no thyromegaly LUNGS:  Clear to auscultation bilaterally CHEST:  Unremarkable HEART:  PMI not displaced or sustained,S1 and S2 within normal limits, no S3, no S4, no clicks, no rubs, no murmurs ABD:  Flat, positive bowel sounds normal in frequency in pitch, no bruits, no rebound, no guarding, no midline pulsatile mass, no hepatomegaly, no splenomegaly EXT:  2 plus pulses throughout, no edema, no cyanosis no clubbing   EKG:  EKG is  ordered today. Sinus rhythm, rate 70, axis within normal limits, intervals within normal limits, no acute ST-T wave changes.  Recent Labs: 02/05/2020: BUN 17; Creatinine, Ser 1.09; Potassium 3.9; Sodium 141    Lipid Panel    Component Value Date/Time   CHOL 137 01/30/2019 1016   TRIG 156.0 (H) 01/30/2019 1016   HDL 39.00 (L) 01/30/2019 1016   CHOLHDL 4 01/30/2019 1016   VLDL 31.2 01/30/2019 1016   LDLCALC 67 01/30/2019 1016      Wt Readings from Last 3 Encounters:  03/26/20 223 lb (101.2 kg)  03/22/20 224 lb (101.6 kg)  10/09/19 210 lb 9.6 oz (95.5 kg)      Other studies Reviewed: Additional studies/ records that were reviewed today include: TCTS consultation note Review of the above records demonstrates: See elsewhere   ASSESSMENT AND PLAN:  THORACIC AORTIC ANEURYSM:    I long discussion with the patient.  Dr. [name] personally reviewed  his films with the patient reports that it measured at some measurements to be 47 mm of the CT from November and the report but 49 mm by echo.  This has increased from 44 mm in 2019.  We had a long discussion about the timing of this.  Indications for replacement at this size would include rapid expansion of the aortic connective tissue disorders.  He does have labile blood pressures.  Is a vague family history of family members with  aneurysms although I do not see early dissection or familial aortopathy documented.  We are going to proceed with further evaluation as below.  CHEST PAIN: He has chest pain and shortness of breath.  He has significant cardiovascular risk factors.  I will order right and left heart catheterization for the possibility of unstable angina and also for possible preoperative evaluation prior to aortic replacement.  INSOMNIA:    He uses CPAP.   DYSLIPIDEMIA:     LDL was 82.  Pending his cath results we will consider further goals of therapy.   HTN:    His blood pressure is labile but reasonably controlled on multiple readings I can see.  Continue current therapy.    SOB: This will be evaluated as above.  FATIGUE: I did have a discussion with him that his fatigue is not related to the size of his aorta at this point.  He is not anemic.  His thyroid is unremarkable.  I will defer to his primary provider.  COVID EDUCATION:  He has been vaccinated.     Current medicines are reviewed at length with the patient today.  The patient does not have concerns regarding medicines.  The following changes have been made:  None  Labs/ tests ordered today include:   Orders Placed This Encounter  Procedures  . Basic metabolic panel  . CBC  . EKG 12-Lead     Disposition:   FU with me after the cath months.     Signed, [name], MD  03/26/2020 1:01 PM    [facility]

## 2020-03-25 NOTE — Progress Notes (Signed)
Cardiology Office Note   Date:  03/26/2020    ID:  TIONNE Hernandez, DOB 15-Oct-1970, MRN [id number]  PCP:  Clayton Sou, MD  Cardiologist:   Clayton Breeding, MD Referring:  Clayton Sou, MD  Chief Complaint  Patient presents with  . Thoracic Aortic Aneurysm      History of Present Illness: [place] Hernandez is a 49 y.o. male who presents for follow up of a thoracic ascending aneurysm.  He moved here from [place].  He has had a complicated history but per diverticulum that required surgery.  He subsequently was found to have small colon cancers that were resected.  During all of this he was found to have an enlarged thoracic aorta.  There is CT from last 2019 and it was 4.4 cm.  There was no calcium noted in his coronaries at that time.  He had a bovine aortic arch variant.  The aorta was 4.5 cm in November of last year.   It was increased to 4.7 cm.  Dr. Orvan Hernandez has suggested elective aortic replacement.    The patient has multiple complaints.  He is very anxious.  He describes fatigue.  He says palpitations.  He has had chest discomfort.  He points under his left axillary area.  This seems to come and go.  He cannot do much activity because he gets short of breath and also might exacerbate some of this chest discomfort.  He is not having any neck discomfort.  He does have some radiation down his left arm.  He is not having any PND or orthopnea.  Has had no new palpitations, presyncope or syncope.   Past Medical History:  Diagnosis Date  . Ascending aortic aneurysm (HCC)    4.7 cm. 12/2018 imaging->stable. 02/2020 CT angio/aortoa slight progression->cardiol ref'd him to CT surg  . Cancer [facility])    colon cancers per doctor's notes  . Chronic renal insufficiency, stage 3 (moderate) (HCC)    GFR 55 ml/min  . Cirrhosis, nonalcoholic (HCC)    NAFLD  . Diverticulitis   . Essential hypertension   . Frequent headaches   . GERD (gastroesophageal reflux disease)   .  History of colon cancer 2015   Surgery; but no chemo or rad was required.  Had a total of 5 surgeries, hx of colostomy, then an ileostomy.  [place] Hernandez Hx of migraines   . Hyperlipemia, mixed    Dr. Percival Spanish d/c'd his fibrate and is considering switch/discontinuation of his statin (as of 05/2019)  . Insomnia   . Lateral epicondylitis    Recurrent, right: steroid injection by Dr. Junius Hernandez Sept and Nov 2020.  . Mild intermittent asthma   . Obesity, Class I, BMI 30-34.9     Past Surgical History:  Procedure Laterality Date  . ABI's  03/2019   Normal  . COLON SURGERY  2015   2 cm colon ca excised.  . COLONOSCOPY  many   he has had colonoscopy q94mo since 2015.  Most recent was approx 04/2018->normal.  . COLOSTOMY  2015   diverticulitis   . ILEOSTOMY  2016     Current Outpatient Medications  Medication Sig Dispense Refill  . albuterol (VENTOLIN HFA) 108 (90 Base) MCG/ACT inhaler Inhale 2 puffs into the lungs every 6 (six) hours as needed for wheezing or shortness of breath.    [place] amLODipine (NORVASC) 10 MG tablet Take 1 tablet (10 mg total) by mouth daily. 90 tablet 3  . cetirizine (ZYRTEC) 10  MG tablet Take 10 mg by mouth daily as needed for allergies.    . cyanocobalamin (,VITAMIN B-12,) 1000 MCG/ML injection Inject 1,000 mcg into the muscle every 30 (thirty) days.    . metoprolol succinate (TOPROL-XL) 25 MG 24 hr tablet TAKE 1 TABLET (25 MG TOTAL) BY MOUTH DAILY. TAKE WITH OR IMMEDIATELY FOLLOWING A MEAL. 90 tablet 3  . omeprazole (PRILOSEC) 20 MG capsule TAKE 1 CAPSULE BY MOUTH EVERY DAY (Patient taking differently: Take 20 mg by mouth daily.) 90 capsule 3  . rosuvastatin (CRESTOR) 20 MG tablet Take 1 tablet (20 mg total) by mouth daily. (Patient taking differently: Take 20 mg by mouth daily in the afternoon.) 90 tablet 3  . valsartan (DIOVAN) 160 MG tablet Take 1 tablet (160 mg total) by mouth daily. 90 tablet 3  . aspirin EC 81 MG tablet Take 1 tablet (81 mg total) by mouth daily. Swallow whole.  90 tablet 3   No current facility-administered medications for this visit.    Allergies:   Morphine and related, Penicillins, and Toradol [ketorolac tromethamine]    ROS:  Please see the history of present illness.   Otherwise, review of systems are positive for none.   All other systems are reviewed and negative.    PHYSICAL EXAM: VS:  BP 114/72   Pulse 70   Ht 6\' 2"  (1.88 m)   Wt 223 lb (101.2 kg)   BMI 28.63 kg/m  , BMI Body mass index is 28.63 kg/m. GENERAL:  Well appearing NECK:  No jugular venous distention, waveform within normal limits, carotid upstroke brisk and symmetric, no bruits, no thyromegaly LUNGS:  Clear to auscultation bilaterally CHEST:  Unremarkable HEART:  PMI not displaced or sustained,S1 and S2 within normal limits, no S3, no S4, no clicks, no rubs, no murmurs ABD:  Flat, positive bowel sounds normal in frequency in pitch, no bruits, no rebound, no guarding, no midline pulsatile mass, no hepatomegaly, no splenomegaly EXT:  2 plus pulses throughout, no edema, no cyanosis no clubbing   EKG:  EKG is  ordered today. Sinus rhythm, rate 70, axis within normal limits, intervals within normal limits, no acute ST-T wave changes.  Recent Labs: 02/05/2020: BUN 17; Creatinine, Ser 1.09; Potassium 3.9; Sodium 141    Lipid Panel    Component Value Date/Time   CHOL 137 01/30/2019 1016   TRIG 156.0 (H) 01/30/2019 1016   HDL 39.00 (L) 01/30/2019 1016   CHOLHDL 4 01/30/2019 1016   VLDL 31.2 01/30/2019 1016   LDLCALC 67 01/30/2019 1016      Wt Readings from Last 3 Encounters:  03/26/20 223 lb (101.2 kg)  03/22/20 224 lb (101.6 kg)  10/09/19 210 lb 9.6 oz (95.5 kg)      Other studies Reviewed: Additional studies/ records that were reviewed today include: TCTS consultation note Review of the above records demonstrates: See elsewhere   ASSESSMENT AND PLAN:  THORACIC AORTIC ANEURYSM:    I long discussion with the patient.  Dr. Orvan Hernandez personally reviewed  his films with the patient reports that it measured at some measurements to be 47 mm of the CT from November and the report but 49 mm by echo.  This has increased from 44 mm in 2019.  We had a long discussion about the timing of this.  Indications for replacement at this size would include rapid expansion of the aortic connective tissue disorders.  He does have labile blood pressures.  Is a vague family history of family members with  aneurysms although I do not see early dissection or familial aortopathy documented.  We are going to proceed with further evaluation as below.  CHEST PAIN: He has chest pain and shortness of breath.  He has significant cardiovascular risk factors.  I will order right and left heart catheterization for the possibility of unstable angina and also for possible preoperative evaluation prior to aortic replacement.  INSOMNIA:    He uses CPAP.   DYSLIPIDEMIA:     LDL was 82.  Pending his cath results we will consider further goals of therapy.   HTN:    His blood pressure is labile but reasonably controlled on multiple readings I can see.  Continue current therapy.    SOB: This will be evaluated as above.  FATIGUE: I did have a discussion with him that his fatigue is not related to the size of his aorta at this point.  He is not anemic.  His thyroid is unremarkable.  I will defer to his primary provider.  COVID EDUCATION:  He has been vaccinated.     Current medicines are reviewed at length with the patient today.  The patient does not have concerns regarding medicines.  The following changes have been made:  None  Labs/ tests ordered today include:   Orders Placed This Encounter  Procedures  . Basic metabolic panel  . CBC  . EKG 12-Lead     Disposition:   FU with me after the cath months.     Signed, Clayton Breeding, MD  03/26/2020 1:01 PM    [facility]

## 2020-03-26 ENCOUNTER — Other Ambulatory Visit: Payer: Self-pay | Admitting: *Deleted

## 2020-03-26 ENCOUNTER — Other Ambulatory Visit (HOSPITAL_COMMUNITY)
Admission: RE | Admit: 2020-03-26 | Discharge: 2020-03-26 | Disposition: A | Discharge status: Home / Self Care | Payer: PRIVATE HEALTH INSURANCE | Source: Ambulatory Visit | Attending: Interventional Cardiology | Admitting: Interventional Cardiology

## 2020-03-26 ENCOUNTER — Ambulatory Visit (INDEPENDENT_AMBULATORY_CARE_PROVIDER_SITE_OTHER): Payer: PRIVATE HEALTH INSURANCE | Admitting: Cardiology

## 2020-03-26 ENCOUNTER — Other Ambulatory Visit: Payer: Self-pay

## 2020-03-26 ENCOUNTER — Encounter: Payer: Self-pay | Admitting: Cardiology

## 2020-03-26 VITALS — BP 114/72 | HR 70 | Ht 74.0 in | Wt 223.0 lb

## 2020-03-26 DIAGNOSIS — I712 Thoracic aortic aneurysm, without rupture, unspecified: Secondary | ICD-10-CM

## 2020-03-26 DIAGNOSIS — Z01812 Encounter for preprocedural laboratory examination: Secondary | ICD-10-CM | POA: Diagnosis not present

## 2020-03-26 DIAGNOSIS — I10 Essential (primary) hypertension: Secondary | ICD-10-CM | POA: Diagnosis not present

## 2020-03-26 DIAGNOSIS — Z20822 Contact with and (suspected) exposure to covid-19: Secondary | ICD-10-CM | POA: Diagnosis not present

## 2020-03-26 DIAGNOSIS — I7121 Aneurysm of the ascending aorta, without rupture: Secondary | ICD-10-CM

## 2020-03-26 DIAGNOSIS — E785 Hyperlipidemia, unspecified: Secondary | ICD-10-CM | POA: Diagnosis not present

## 2020-03-26 LAB — SARS CORONAVIRUS 2 (TAT 6-24 HRS): SARS Coronavirus 2: NEGATIVE

## 2020-03-26 MED ORDER — ASPIRIN EC 81 MG PO TBEC: 81.0000 mg | DELAYED_RELEASE_TABLET | Freq: Every day | ORAL | 3 refills | Status: DC

## 2020-03-26 NOTE — Patient Instructions (Signed)
Medication Instructions:  START 81MG  OF ASPIRIN DAILY *If you need a refill on your cardiac medications before your next appointment, please call your pharmacy*  Lab Work: Your physician recommends that you return for lab work in: TODAY (CBC, BMP) If you have labs (blood work) drawn today and your tests are completely normal, you will receive your results only by: [name] MyChart Message (if you have MyChart) OR . A paper copy in the mail If you have any lab test that is abnormal or we need to change your treatment, we will call you to review the results.  Testing/Procedures: RIGHT AND LEFT HEART CATH ON Tuesday December 14th.  Follow-Up: At [facility], you and your health needs are our priority.  As part of our continuing mission to provide you with exceptional heart care, we have created designated Provider Care Teams.  These Care Teams include your primary Cardiologist (physician) and Advanced Practice Providers (APPs -  Physician Assistants and Nurse Practitioners) who all work together to provide you with the care you need, when you need it.  We recommend signing up for the patient portal called "MyChart".  Sign up information is provided on this After Visit Summary.  MyChart is used to connect with patients for Virtual Visits (Telemedicine).  Patients are able to view lab/test results, encounter notes, upcoming appointments, etc.  Non-urgent messages can be sent to your provider as well.   To learn more about what you can do with MyChart, go to [email or web address].    Your next appointment:   3 month(s)  The format for your next appointment:   In Person  Provider:   [name], MD    Other Instructions    [place] [address] Dept: [phone] Loc: [place]  03/26/2020  You are scheduled for a Cardiac Catheterization on  Tuesday, December 14 with Dr. Larae Grooms.  1. Please arrive at the [facility] (Main Entrance A) at [facility]: [address] [place], [place] [address] at 5:30 AM (This time is two hours before your procedure to ensure your preparation). Free valet parking service is available.   Special note: Every effort is made to have your procedure done on time. Please understand that emergencies sometimes delay scheduled procedures.  2. Diet: Do not eat solid foods after midnight.  The patient may have clear liquids until 5am upon the day of the procedure.  3. Labs: You will need to have blood drawn on Friday, December 10 at [place]  Open: [place] (Lunch 12:30 - 1:30)   Phone: [phone]. You do not need to be fasting.  Covid Screening on 03/26/2020 at 1:40pm. This is a Drive Up Visit at [address] [place], [place] [address] Someone will direct you to the appropriate testing line. Stay in your car and someone will be with you shortly.  4. Medication instructions in preparation for your procedure:   Contrast Allergy: No  On the morning of your procedure, take your Aspirin and any morning medicines.  You may use sips of water.  5. Plan for one night stay--bring personal belongings. 6. Bring a current list of your medications and current insurance cards. 7. You MUST have a responsible person to drive you home. 8. Someone MUST be with you the first 24 hours after you arrive home or your discharge will be delayed. 9. Please  wear clothes that are easy to get on and off and wear slip-on shoes.  Thank you for allowing [place] to care for you!   -- [place] [facility]

## 2020-03-27 LAB — CBC
Hematocrit: 54.3 % — ABNORMAL HIGH (ref 37.5–51.0)
Hemoglobin: 18.3 g/dL — ABNORMAL HIGH (ref 13.0–17.7)
MCH: 29.6 pg (ref 26.6–33.0)
MCHC: 33.7 g/dL (ref 31.5–35.7)
MCV: 88 fL (ref 79–97)
Platelets: 128 10*3/uL — ABNORMAL LOW (ref 150–450)
RBC: 6.18 x10E6/uL — ABNORMAL HIGH (ref 4.14–5.80)
RDW: 13.9 % (ref 11.6–15.4)
WBC: 6.3 10*3/uL (ref 3.4–10.8)

## 2020-03-27 LAB — BASIC METABOLIC PANEL
BUN/Creatinine Ratio: 13 (ref 9–20)
BUN: 18 mg/dL (ref 6–24)
CO2: 25 mmol/L (ref 20–29)
Calcium: 9.6 mg/dL (ref 8.7–10.2)
Chloride: 102 mmol/L (ref 96–106)
Creatinine, Ser: 1.36 mg/dL — ABNORMAL HIGH (ref 0.76–1.27)
GFR calc Af Amer: 69 mL/min/{1.73_m2} (ref >59)
GFR calc non Af Amer: 60 mL/min/{1.73_m2} (ref >59)
Glucose: 101 mg/dL — ABNORMAL HIGH (ref 65–99)
Potassium: 4.6 mmol/L (ref 3.5–5.2)
Sodium: 141 mmol/L (ref 134–144)

## 2020-03-29 ENCOUNTER — Telehealth: Payer: Self-pay | Admitting: *Deleted

## 2020-03-29 ENCOUNTER — Other Ambulatory Visit: Payer: Self-pay | Admitting: *Deleted

## 2020-03-29 DIAGNOSIS — I712 Thoracic aortic aneurysm, without rupture, unspecified: Secondary | ICD-10-CM

## 2020-03-29 NOTE — Telephone Encounter (Addendum)
Pt contacted pre-catheterization scheduled at [facility] for: Tuesday March 30, 2020 7:30 AM Verified arrival time and place: [facility]) at: 5:30 AM   No solid food after midnight prior to cath, clear liquids until 5 AM day of procedure.   AM meds can be  taken pre-cath with sips of water including: ASA 81 mg-pt should take per [name] 03/29/20   Confirmed patient has responsible adult to drive home post procedure and be with patient first 24 hours after arriving home: yes  You are allowed ONE visitor in the waiting room during the time you are at the hospital for your procedure. Both you and your visitor must wear a mask once you enter the hospital.       COVID-19 Pre-Screening Questions:  . In the past 14 days have you had any symptoms concerning for COVID-19 infection (fever, chills, cough, or new shortness of breath)? no . In the past 14 days have you been around anyone with known Covid 19? no   Reviewed procedure/mask/visitor instructions, COVID-19 questions reviewed with patient.

## 2020-03-30 ENCOUNTER — Encounter (HOSPITAL_COMMUNITY)
Admission: RE | Disposition: A | Discharge status: Home / Self Care | Payer: Self-pay | Source: Home / Self Care | Attending: Interventional Cardiology

## 2020-03-30 ENCOUNTER — Encounter: Payer: Self-pay | Admitting: Family Medicine

## 2020-03-30 ENCOUNTER — Ambulatory Visit (HOSPITAL_BASED_OUTPATIENT_CLINIC_OR_DEPARTMENT_OTHER)
Admission: RE | Admit: 2020-03-30 | Discharge: 2020-03-30 | Disposition: A | Discharge status: Home / Self Care | Payer: BC Managed Care – PPO | Source: Home / Self Care | Attending: Interventional Cardiology | Admitting: Interventional Cardiology

## 2020-03-30 ENCOUNTER — Other Ambulatory Visit: Payer: Self-pay

## 2020-03-30 ENCOUNTER — Inpatient Hospital Stay (HOSPITAL_BASED_OUTPATIENT_CLINIC_OR_DEPARTMENT_OTHER)
Admission: RE | Admit: 2020-03-30 | Discharge: 2020-03-30 | Disposition: A | Discharge status: Home / Self Care | Payer: BC Managed Care – PPO | Source: Ambulatory Visit | Attending: Cardiothoracic Surgery | Admitting: Cardiothoracic Surgery

## 2020-03-30 ENCOUNTER — Other Ambulatory Visit (HOSPITAL_COMMUNITY): Payer: Self-pay

## 2020-03-30 ENCOUNTER — Other Ambulatory Visit (HOSPITAL_COMMUNITY): Payer: PRIVATE HEALTH INSURANCE

## 2020-03-30 DIAGNOSIS — R5383 Other fatigue: Secondary | ICD-10-CM | POA: Insufficient documentation

## 2020-03-30 DIAGNOSIS — Z885 Allergy status to narcotic agent status: Secondary | ICD-10-CM | POA: Insufficient documentation

## 2020-03-30 DIAGNOSIS — I712 Thoracic aortic aneurysm, without rupture: Secondary | ICD-10-CM

## 2020-03-30 DIAGNOSIS — Z880 Allergy status to penicillin: Secondary | ICD-10-CM | POA: Insufficient documentation

## 2020-03-30 DIAGNOSIS — R0602 Shortness of breath: Secondary | ICD-10-CM | POA: Insufficient documentation

## 2020-03-30 DIAGNOSIS — I10 Essential (primary) hypertension: Secondary | ICD-10-CM | POA: Insufficient documentation

## 2020-03-30 DIAGNOSIS — G4700 Insomnia, unspecified: Secondary | ICD-10-CM | POA: Insufficient documentation

## 2020-03-30 DIAGNOSIS — Z7982 Long term (current) use of aspirin: Secondary | ICD-10-CM | POA: Insufficient documentation

## 2020-03-30 DIAGNOSIS — Z79899 Other long term (current) drug therapy: Secondary | ICD-10-CM | POA: Insufficient documentation

## 2020-03-30 DIAGNOSIS — R079 Chest pain, unspecified: Secondary | ICD-10-CM | POA: Insufficient documentation

## 2020-03-30 HISTORY — PX: RIGHT/LEFT HEART CATH AND CORONARY ANGIOGRAPHY: CATH118266

## 2020-03-30 HISTORY — PX: THORACIC AORTOGRAM: CATH118269

## 2020-03-30 HISTORY — PX: CARDIAC CATHETERIZATION: SHX172

## 2020-03-30 LAB — POCT I-STAT EG7
Acid-base deficit: 1 mmol/L (ref 0.0–2.0)
Acid-base deficit: 2 mmol/L (ref 0.0–2.0)
Bicarbonate: 24.8 mmol/L (ref 20.0–28.0)
Bicarbonate: 25.6 mmol/L (ref 20.0–28.0)
Calcium, Ion: 1.29 mmol/L (ref 1.15–1.40)
Calcium, Ion: 1.31 mmol/L (ref 1.15–1.40)
HCT: 47 % (ref 39.0–52.0)
HCT: 48 % (ref 39.0–52.0)
Hemoglobin: 16 g/dL (ref 13.0–17.0)
Hemoglobin: 16.3 g/dL (ref 13.0–17.0)
O2 Saturation: 80 %
O2 Saturation: 82 %
Potassium: 4.4 mmol/L (ref 3.5–5.1)
Potassium: 4.5 mmol/L (ref 3.5–5.1)
Sodium: 141 mmol/L (ref 135–145)
Sodium: 142 mmol/L (ref 135–145)
TCO2: 26 mmol/L (ref 22–32)
TCO2: 27 mmol/L (ref 22–32)
pCO2, Ven: 49.6 mmHg (ref 44.0–60.0)
pCO2, Ven: 50.5 mmHg (ref 44.0–60.0)
pH, Ven: 7.307 (ref 7.250–7.430)
pH, Ven: 7.314 (ref 7.250–7.430)
pO2, Ven: 49 mmHg — ABNORMAL HIGH (ref 32.0–45.0)
pO2, Ven: 51 mmHg — ABNORMAL HIGH (ref 32.0–45.0)

## 2020-03-30 LAB — POCT I-STAT 7, (LYTES, BLD GAS, ICA,H+H)
Acid-base deficit: 2 mmol/L (ref 0.0–2.0)
Bicarbonate: 23.8 mmol/L (ref 20.0–28.0)
Calcium, Ion: 1.3 mmol/L (ref 1.15–1.40)
HCT: 48 % (ref 39.0–52.0)
Hemoglobin: 16.3 g/dL (ref 13.0–17.0)
O2 Saturation: 98 %
Potassium: 4.5 mmol/L (ref 3.5–5.1)
Sodium: 141 mmol/L (ref 135–145)
TCO2: 25 mmol/L (ref 22–32)
pCO2 arterial: 44.6 mmHg (ref 32.0–48.0)
pH, Arterial: 7.334 — ABNORMAL LOW (ref 7.350–7.450)
pO2, Arterial: 119 mmHg — ABNORMAL HIGH (ref 83.0–108.0)

## 2020-03-30 SURGERY — RIGHT/LEFT HEART CATH AND CORONARY ANGIOGRAPHY
Anesthesia: LOCAL

## 2020-03-30 MED ORDER — SODIUM CHLORIDE 0.9 % IV SOLN
250.0000 mL | INTRAVENOUS | Status: DC | PRN
Start: 2020-03-30 — End: 2020-03-30

## 2020-03-30 MED ORDER — LIDOCAINE HCL (PF) 1 % IJ SOLN
INTRAMUSCULAR | Status: DC | PRN
  Administered 2020-03-30: 3 mL
  Administered 2020-03-30: 2 mL

## 2020-03-30 MED ORDER — HEPARIN (PORCINE) IN NACL 1000-0.9 UT/500ML-% IV SOLN
INTRAVENOUS | Status: DC | PRN
  Administered 2020-03-30 (×2): 500 mL

## 2020-03-30 MED ORDER — HEPARIN SODIUM (PORCINE) 1000 UNIT/ML IJ SOLN
INTRAMUSCULAR | Status: AC
  Filled 2020-03-30: qty 1

## 2020-03-30 MED ORDER — MIDAZOLAM HCL 2 MG/2ML IJ SOLN
INTRAMUSCULAR | Status: AC
  Filled 2020-03-30: qty 2

## 2020-03-30 MED ORDER — SODIUM CHLORIDE 0.9 % IV SOLN: INTRAVENOUS | Status: AC

## 2020-03-30 MED ORDER — SODIUM CHLORIDE 0.9% FLUSH: 3.0000 mL | INTRAVENOUS | Status: DC | PRN

## 2020-03-30 MED ORDER — SODIUM CHLORIDE 0.9 % WEIGHT BASED INFUSION: 1.0000 mL/kg/h | INTRAVENOUS | Status: DC

## 2020-03-30 MED ORDER — HYDRALAZINE HCL 20 MG/ML IJ SOLN: 10.0000 mg | INTRAMUSCULAR | Status: DC | PRN

## 2020-03-30 MED ORDER — SODIUM CHLORIDE 0.9% FLUSH: 3.0000 mL | Freq: Two times a day (BID) | INTRAVENOUS | Status: DC

## 2020-03-30 MED ORDER — HEPARIN (PORCINE) IN NACL 1000-0.9 UT/500ML-% IV SOLN
INTRAVENOUS | Status: AC
  Filled 2020-03-30: qty 1000

## 2020-03-30 MED ORDER — ONDANSETRON HCL 4 MG/2ML IJ SOLN: 4.0000 mg | Freq: Four times a day (QID) | INTRAMUSCULAR | Status: DC | PRN

## 2020-03-30 MED ORDER — SODIUM CHLORIDE 0.9 % IV SOLN: 250.0000 mL | INTRAVENOUS | Status: DC | PRN

## 2020-03-30 MED ORDER — LIDOCAINE HCL (PF) 1 % IJ SOLN
INTRAMUSCULAR | Status: AC
  Filled 2020-03-30: qty 30

## 2020-03-30 MED ORDER — ACETAMINOPHEN 325 MG PO TABS: 650.0000 mg | ORAL_TABLET | ORAL | Status: DC | PRN

## 2020-03-30 MED ORDER — LABETALOL HCL 5 MG/ML IV SOLN: 10.0000 mg | INTRAVENOUS | Status: DC | PRN

## 2020-03-30 MED ORDER — HEPARIN SODIUM (PORCINE) 1000 UNIT/ML IJ SOLN
INTRAMUSCULAR | Status: DC | PRN
  Administered 2020-03-30: 5000 [IU] via INTRAVENOUS

## 2020-03-30 MED ORDER — VERAPAMIL HCL 2.5 MG/ML IV SOLN
INTRAVENOUS | Status: DC | PRN
  Administered 2020-03-30: 08:00:00 10 mL via INTRA_ARTERIAL

## 2020-03-30 MED ORDER — SODIUM CHLORIDE 0.9 % WEIGHT BASED INFUSION
3.0000 mL/kg/h | INTRAVENOUS | Status: AC
  Administered 2020-03-30: 07:00:00 3 mL/kg/h via INTRAVENOUS

## 2020-03-30 MED ORDER — MIDAZOLAM HCL 2 MG/2ML IJ SOLN
INTRAMUSCULAR | Status: DC | PRN
  Administered 2020-03-30: 2 mg via INTRAVENOUS
  Administered 2020-03-30 (×2): 1 mg via INTRAVENOUS

## 2020-03-30 MED ORDER — FENTANYL CITRATE (PF) 100 MCG/2ML IJ SOLN
INTRAMUSCULAR | Status: DC | PRN
  Administered 2020-03-30 (×2): 25 ug via INTRAVENOUS
  Administered 2020-03-30: 50 ug via INTRAVENOUS

## 2020-03-30 MED ORDER — FENTANYL CITRATE (PF) 100 MCG/2ML IJ SOLN
INTRAMUSCULAR | Status: AC
  Filled 2020-03-30: qty 2

## 2020-03-30 MED ORDER — IOHEXOL 350 MG/ML SOLN
INTRAVENOUS | Status: DC | PRN
  Administered 2020-03-30: 08:00:00 80 mL

## 2020-03-30 MED ORDER — VERAPAMIL HCL 2.5 MG/ML IV SOLN
INTRAVENOUS | Status: AC
  Filled 2020-03-30: qty 2

## 2020-03-30 MED ORDER — ASPIRIN 81 MG PO CHEW: 81.0000 mg | CHEWABLE_TABLET | ORAL | Status: DC

## 2020-03-30 SURGICAL SUPPLY — 13 items
CATH 5FR JL3.5 JR4 ANG PIG MP (CATHETERS) ×2 IMPLANT
CATH BALLN WEDGE 5F 110CM (CATHETERS) ×2 IMPLANT
DEVICE RAD COMP TR BAND LRG (VASCULAR PRODUCTS) ×2 IMPLANT
GLIDESHEATH SLEND SS 6F .021 (SHEATH) ×2 IMPLANT
GUIDEWIRE .025 260CM (WIRE) ×2 IMPLANT
GUIDEWIRE INQWIRE 1.5J.035X260 (WIRE) ×1 IMPLANT
INQWIRE 1.5J .035X260CM (WIRE) ×2
KIT HEART LEFT (KITS) ×2 IMPLANT
PACK CARDIAC CATHETERIZATION (CUSTOM PROCEDURE TRAY) ×2 IMPLANT
SHEATH GLIDE SLENDER 4/5FR (SHEATH) ×2 IMPLANT
SYR MEDRAD MARK 7 150ML (SYRINGE) ×2 IMPLANT
TRANSDUCER W/STOPCOCK (MISCELLANEOUS) ×2 IMPLANT
TUBING CIL FLEX 10 FLL-RA (TUBING) ×2 IMPLANT

## 2020-03-30 NOTE — Discharge Instructions (Signed)
Radial Site Care  This sheet gives you information about how to care for yourself after your procedure. Your health care provider may also give you more specific instructions. If you have problems or questions, contact your health care provider. What can I expect after the procedure? After the procedure, it is common to have:  Bruising and tenderness at the catheter insertion area. Follow these instructions at home: Medicines  Take over-the-counter and prescription medicines only as told by your health care provider. Insertion site care  Follow instructions from your health care provider about how to take care of your insertion site. Make sure you: ? Wash your hands with soap and water before you change your bandage (dressing). If soap and water are not available, use hand sanitizer. ? Change your dressing as told by your health care provider. ? Leave stitches (sutures), skin glue, or adhesive strips in place. These skin closures may need to stay in place for 2 weeks or longer. If adhesive strip edges start to loosen and curl up, you may trim the loose edges. Do not remove adhesive strips completely unless your health care provider tells you to do that.  Check your insertion site every day for signs of infection. Check for: ? Redness, swelling, or pain. ? Fluid or blood. ? Pus or a bad smell. ? Warmth.  Do not take baths, swim, or use a hot tub until your health care provider approves.  You may shower 24-48 hours after the procedure, or as directed by your health care provider. ? Remove the dressing and gently wash the site with plain soap and water. ? Pat the area dry with a clean towel. ? Do not rub the site. That could cause bleeding.  Do not apply powder or lotion to the site. Activity   For 24 hours after the procedure, or as directed by your health care provider: ? Do not flex or bend the affected arm. ? Do not push or pull heavy objects with the affected arm. ? Do not  drive yourself home from the hospital or clinic. You may drive 24 hours after the procedure unless your health care provider tells you not to. ? Do not operate machinery or power tools.  Do not lift anything that is heavier than 10 lb (4.5 kg), or the limit that you are told, until your health care provider says that it is safe.  Ask your health care provider when it is okay to: ? Return to work or school. ? Resume usual physical activities or sports. ? Resume sexual activity. General instructions  If the catheter site starts to bleed, raise your arm and put firm pressure on the site. If the bleeding does not stop, get help right away. This is a medical emergency.  If you went home on the same day as your procedure, a responsible adult should be with you for the first 24 hours after you arrive home.  Keep all follow-up visits as told by your health care provider. This is important. Contact a health care provider if:  You have a fever.  You have redness, swelling, or yellow drainage around your insertion site. Get help right away if:  You have unusual pain at the radial site.  The catheter insertion area swells very fast.  The insertion area is bleeding, and the bleeding does not stop when you hold steady pressure on the area.  Your arm or hand becomes pale, cool, tingly, or numb. These symptoms may represent a serious problem   that is an emergency. Do not wait to see if the symptoms will go away. Get medical help right away. Call your local emergency services (911 in the [place] Do not drive yourself to the hospital. Summary  After the procedure, it is common to have bruising and tenderness at the site.  Follow instructions from your health care provider about how to take care of your radial site wound. Check the wound every day for signs of infection.  Do not lift anything that is heavier than 10 lb (4.5 kg), or the limit that you are told, until your health care provider says  that it is safe. This information is not intended to replace advice given to you by your health care provider. Make sure you discuss any questions you have with your health care provider. Document Revised: 05/09/2017 Document Reviewed: 05/09/2017 Elsevier Patient Education  2020 Elsevier Inc.  

## 2020-03-30 NOTE — Research (Signed)
[place] Informed Consent   Subject Name: Clayton Hernandez [id number]  Subject met inclusion and exclusion criteria.  The informed consent form, study requirements and expectations were reviewed with the subject and questions and concerns were addressed prior to the signing of the consent form.  The subject verbalized understanding of the trail requirements.  The subject agreed to participate in the [facility] trial and signed the informed consent.  The informed consent was obtained prior to performance of any protocol-specific procedures for the subject.  A copy of the signed informed consent was given to the subject and a copy was placed in the subject's medical record.  [name]. 03/30/2020, 06:45 am

## 2020-03-30 NOTE — Progress Notes (Signed)
Pre-CABG ultrasound for TAA study completed.   Please see CV Proc for preliminary results.   Darlin Coco, RDMS

## 2020-03-30 NOTE — Progress Notes (Signed)
Ria Comment, NP notified of need for medication reconciliation and discharge order. Will review discharge instructions with patient and family member once this is complete.

## 2020-03-30 NOTE — Interval H&P Note (Signed)
Cath Lab Visit (complete for each Cath Lab visit)  Clinical Evaluation Leading to the Procedure:   ACS: No.  Non-ACS:    Anginal Classification: CCS II  Anti-ischemic medical therapy: Minimal Therapy (1 class of medications)  Non-Invasive Test Results: No non-invasive testing performed  Prior CABG: No previous CABG   Preop cath before aneurysm repair   History and Physical Interval Note:  03/30/2020 7:35 AM  Clayton Hernandez  has presented today for surgery, with the diagnosis of chest pain.  The various methods of treatment have been discussed with the patient and family. After consideration of risks, benefits and other options for treatment, the patient has consented to  Procedure(s): RIGHT/LEFT HEART CATH AND CORONARY ANGIOGRAPHY (N/A) as a surgical intervention.  The patient's history has been reviewed, patient examined, no change in status, stable for surgery.  I have reviewed the patient's chart and labs.  Questions were answered to the patient's satisfaction.     Larae Grooms

## 2020-03-31 ENCOUNTER — Other Ambulatory Visit (HOSPITAL_COMMUNITY)
Admission: RE | Admit: 2020-03-31 | Discharge: 2020-03-31 | Disposition: A | Discharge status: Home / Self Care | Payer: BC Managed Care – PPO | Source: Ambulatory Visit | Attending: Cardiothoracic Surgery | Admitting: Cardiothoracic Surgery

## 2020-03-31 ENCOUNTER — Ambulatory Visit (HOSPITAL_COMMUNITY)
Admission: RE | Admit: 2020-03-31 | Discharge: 2020-03-31 | Disposition: A | Discharge status: Home / Self Care | Payer: BC Managed Care – PPO | Source: Ambulatory Visit | Attending: Cardiothoracic Surgery | Admitting: Cardiothoracic Surgery

## 2020-03-31 ENCOUNTER — Encounter (HOSPITAL_COMMUNITY)
Admission: RE | Admit: 2020-03-31 | Discharge: 2020-03-31 | Disposition: A | Discharge status: Home / Self Care | Payer: BC Managed Care – PPO | Source: Ambulatory Visit | Attending: Cardiothoracic Surgery | Admitting: Cardiothoracic Surgery

## 2020-03-31 ENCOUNTER — Encounter (HOSPITAL_COMMUNITY): Payer: Self-pay

## 2020-03-31 ENCOUNTER — Other Ambulatory Visit: Payer: Self-pay

## 2020-03-31 DIAGNOSIS — Z20822 Contact with and (suspected) exposure to covid-19: Secondary | ICD-10-CM | POA: Insufficient documentation

## 2020-03-31 DIAGNOSIS — Z01812 Encounter for preprocedural laboratory examination: Secondary | ICD-10-CM | POA: Insufficient documentation

## 2020-03-31 DIAGNOSIS — Z01818 Encounter for other preprocedural examination: Secondary | ICD-10-CM | POA: Insufficient documentation

## 2020-03-31 DIAGNOSIS — I712 Thoracic aortic aneurysm, without rupture, unspecified: Secondary | ICD-10-CM

## 2020-03-31 HISTORY — DX: Sleep apnea, unspecified: G47.30

## 2020-03-31 LAB — COMPREHENSIVE METABOLIC PANEL
ALT: 37 U/L (ref 0–44)
AST: 37 U/L (ref 15–41)
Albumin: 4.1 g/dL (ref 3.5–5.0)
Alkaline Phosphatase: 78 U/L (ref 38–126)
Anion gap: 9 (ref 5–15)
BUN: 15 mg/dL (ref 6–20)
CO2: 22 mmol/L (ref 22–32)
Calcium: 9.4 mg/dL (ref 8.9–10.3)
Chloride: 105 mmol/L (ref 98–111)
Creatinine, Ser: 1.07 mg/dL (ref 0.61–1.24)
GFR, Estimated: >60 mL/min (ref >60)
Glucose, Bld: 103 mg/dL — ABNORMAL HIGH (ref 70–99)
Potassium: 4 mmol/L (ref 3.5–5.1)
Sodium: 136 mmol/L (ref 135–145)
Total Bilirubin: 1.3 mg/dL — ABNORMAL HIGH (ref 0.3–1.2)
Total Protein: 7.1 g/dL (ref 6.5–8.1)

## 2020-03-31 LAB — CBC
HCT: 51.2 % (ref 39.0–52.0)
Hemoglobin: 18.4 g/dL — ABNORMAL HIGH (ref 13.0–17.0)
MCH: 30.9 pg (ref 26.0–34.0)
MCHC: 35.9 g/dL (ref 30.0–36.0)
MCV: 85.9 fL (ref 80.0–100.0)
Platelets: 118 10*3/uL — ABNORMAL LOW (ref 150–400)
RBC: 5.96 MIL/uL — ABNORMAL HIGH (ref 4.22–5.81)
RDW: 13 % (ref 11.5–15.5)
WBC: 6 10*3/uL (ref 4.0–10.5)
nRBC: 0 % (ref 0.0–0.2)

## 2020-03-31 LAB — BLOOD GAS, ARTERIAL
Acid-base deficit: 1.2 mmol/L (ref 0.0–2.0)
Bicarbonate: 23.1 mmol/L (ref 20.0–28.0)
Drawn by: 6028611
FIO2: 21
O2 Saturation: 97.7 %
Patient temperature: 37
pCO2 arterial: 38.8 mmHg (ref 32.0–48.0)
pH, Arterial: 7.391 (ref 7.350–7.450)
pO2, Arterial: 100 mmHg (ref 83.0–108.0)

## 2020-03-31 LAB — SURGICAL PCR SCREEN
MRSA, PCR: NEGATIVE
Staphylococcus aureus: NEGATIVE

## 2020-03-31 LAB — URINALYSIS, ROUTINE W REFLEX MICROSCOPIC
Bilirubin Urine: NEGATIVE
Glucose, UA: NEGATIVE mg/dL
Hgb urine dipstick: NEGATIVE
Ketones, ur: NEGATIVE mg/dL
Leukocytes,Ua: NEGATIVE
Nitrite: NEGATIVE
Protein, ur: NEGATIVE mg/dL
Specific Gravity, Urine: 1.016 (ref 1.005–1.030)
pH: 5 (ref 5.0–8.0)

## 2020-03-31 LAB — SARS CORONAVIRUS 2 (TAT 6-24 HRS): SARS Coronavirus 2: NEGATIVE

## 2020-03-31 LAB — APTT: aPTT: 33 seconds (ref 24–36)

## 2020-03-31 LAB — PROTIME-INR
INR: 1 (ref 0.8–1.2)
Prothrombin Time: 12.6 seconds (ref 11.4–15.2)

## 2020-03-31 LAB — HEMOGLOBIN A1C
Hgb A1c MFr Bld: 5.6 % (ref 4.8–5.6)
Mean Plasma Glucose: 114.02 mg/dL

## 2020-03-31 MED ORDER — INSULIN REGULAR(HUMAN) IN NACL 100-0.9 UT/100ML-% IV SOLN
INTRAVENOUS | Status: AC
  Administered 2020-04-01: 09:00:00 2.4 [IU]/h via INTRAVENOUS

## 2020-03-31 MED ORDER — NOREPINEPHRINE 4 MG/250ML-% IV SOLN
0.0000 ug/min | INTRAVENOUS | Status: DC
  Filled 2020-03-31: qty 250

## 2020-03-31 MED ORDER — TRANEXAMIC ACID 1000 MG/10ML IV SOLN
1.5000 mg/kg/h | INTRAVENOUS | Status: AC
  Administered 2020-04-01: 08:00:00 1.5 mg/kg/h via INTRAVENOUS
  Filled 2020-03-31: qty 25

## 2020-03-31 MED ORDER — TRANEXAMIC ACID (OHS) PUMP PRIME SOLUTION
2.0000 mg/kg | INTRAVENOUS | Status: DC
  Filled 2020-03-31: qty 2

## 2020-03-31 MED ORDER — NITROGLYCERIN IN D5W 200-5 MCG/ML-% IV SOLN
2.0000 ug/min | INTRAVENOUS | Status: DC
  Filled 2020-03-31: qty 250

## 2020-03-31 MED ORDER — POTASSIUM CHLORIDE 2 MEQ/ML IV SOLN
80.0000 meq | INTRAVENOUS | Status: DC
  Filled 2020-03-31: qty 40

## 2020-03-31 MED ORDER — DEXMEDETOMIDINE HCL IN NACL 400 MCG/100ML IV SOLN
0.1000 ug/kg/h | INTRAVENOUS | Status: AC
  Administered 2020-04-01: 08:00:00 .3 ug/kg/h via INTRAVENOUS
  Filled 2020-03-31: qty 100

## 2020-03-31 MED ORDER — PHENYLEPHRINE HCL-NACL 20-0.9 MG/250ML-% IV SOLN
30.0000 ug/min | INTRAVENOUS | Status: AC
  Administered 2020-04-01: 08:00:00 40 ug/min via INTRAVENOUS
  Filled 2020-03-31: qty 250

## 2020-03-31 MED ORDER — MILRINONE LACTATE IN DEXTROSE 20-5 MG/100ML-% IV SOLN
0.3000 ug/kg/min | INTRAVENOUS | Status: DC
  Filled 2020-03-31: qty 100

## 2020-03-31 MED ORDER — VANCOMYCIN HCL 1500 MG/300ML IV SOLN
1500.0000 mg | INTRAVENOUS | Status: DC
  Filled 2020-03-31: qty 300

## 2020-03-31 MED ORDER — PLASMA-LYTE 148 IV SOLN
INTRAVENOUS | Status: DC
  Filled 2020-03-31: qty 2.5

## 2020-03-31 MED ORDER — MANNITOL 20 % IV SOLN
Freq: Once | INTRAVENOUS | Status: DC
  Filled 2020-03-31: qty 13

## 2020-03-31 MED ORDER — TRANEXAMIC ACID (OHS) BOLUS VIA INFUSION
15.0000 mg/kg | INTRAVENOUS | Status: AC
  Administered 2020-04-01: 08:00:00 1497 mg via INTRAVENOUS
  Filled 2020-03-31: qty 1497

## 2020-03-31 MED ORDER — SODIUM CHLORIDE 0.9 % IV SOLN
INTRAVENOUS | Status: DC
  Filled 2020-03-31: qty 30

## 2020-03-31 MED ORDER — LEVOFLOXACIN IN D5W 500 MG/100ML IV SOLN
500.0000 mg | INTRAVENOUS | Status: AC
  Administered 2020-04-01: 08:00:00 500 mg via INTRAVENOUS
  Filled 2020-03-31: qty 100

## 2020-03-31 MED ORDER — EPINEPHRINE HCL 5 MG/250ML IV SOLN IN NS
0.0000 ug/min | INTRAVENOUS | Status: DC
  Filled 2020-03-31: qty 250

## 2020-03-31 NOTE — Progress Notes (Signed)
PCP - Dr. Shawnie Dapper Cardiologist - Dr. Percival Spanish  Chest x-ray - 03/31/20 EKG - 03/26/20 Stress Test - denies ECHO - 03/23/20 Cardiac Cath - 03/30/20  Sleep Study - + OSA CPAP - uses QHS- pressure settings 6.0   Aspirin Instructions: Patient instructed to hold all Aspirin, NSAID's, herbal medications, fish oil and vitamins as of today   Anesthesia review: cardiac history  Patient denies shortness of breath, fever, cough and chest pain at PAT appointment   Patient verbalized understanding of instructions that were given to them at the PAT appointment. Patient was also instructed that they will need to review over the PAT instructions again at home before surgery.

## 2020-03-31 NOTE — Pre-Procedure Instructions (Signed)
[id number] - [place], [place] - [address] [place] [address] Phone: [phone] Fax: [phone]      Your procedure is scheduled on Thursday December 16th.  Report to [facility] Main Entrance "A" at 5:30 A.M., and check in at the Admitting office.  Call this number if you have problems the morning of surgery:  [phone]  Call [phone] if you have any questions prior to your surgery date Monday-Friday 8am-4pm    Remember:  Do not eat or drink after midnight the night before your surgery     Take these medicines the morning of surgery with A SIP OF WATER  amLODipine (NORVASC) metoprolol succinate (TOPROL-XL) omeprazole (PRILOSEC)    As of today, STOP taking any Aspirin (unless otherwise instructed by your surgeon) Aleve, Naproxen, Ibuprofen, Motrin, Advil, Goody's, BC's, all herbal medications, fish oil, and all vitamins.                      Do not wear jewelry, make up, or nail polish            Do not wear lotions, powders, perfumes/colognes, or deodorant.            Do not shave 48 hours prior to surgery.  Men may shave face and neck.            Do not bring valuables to the hospital.            [name] is not responsible for any belongings or valuables.  Do NOT Smoke (Tobacco/Vaping) or drink Alcohol 24 hours prior to your procedure If you use a CPAP at night, you may bring all equipment for your overnight stay.   Contacts, glasses, dentures or bridgework may not be worn into surgery.      For patients admitted to the hospital, discharge time will be determined by your treatment team.   Patients discharged the day of surgery will not be allowed to drive home, and someone needs to stay with them for 24 hours.    Special instructions:   [name]- Preparing For Surgery  Before surgery, you can play an important role. Because skin is not sterile, your skin needs to be as free of germs as possible.  You can reduce the number of germs on your skin by washing with CHG (chlorahexidine gluconate) Soap before surgery.  CHG is an antiseptic cleaner which kills germs and bonds with the skin to continue killing germs even after washing.    Oral Hygiene is also important to reduce your risk of infection.  Remember - BRUSH YOUR TEETH THE MORNING OF SURGERY WITH YOUR REGULAR TOOTHPASTE  Please do not use if you have an allergy to CHG or antibacterial soaps. If your skin becomes reddened/irritated stop using the CHG.  Do not shave (including legs and underarms) for at least 48 hours prior to first CHG shower. It is OK to shave your face.  Please follow these instructions carefully.   1. Shower the NIGHT BEFORE SURGERY and the MORNING OF SURGERY with CHG Soap.   2. If you chose to wash your hair, wash your hair first as usual with your normal shampoo.  3. After you shampoo, rinse your hair and body thoroughly to remove the shampoo.  4. Use CHG as you would any other liquid soap. You can apply CHG directly to the skin and wash gently with a scrungie or a clean  washcloth.   5. Apply the CHG Soap to your body ONLY FROM THE NECK DOWN.  Do not use on open wounds or open sores. Avoid contact with your eyes, ears, mouth and genitals (private parts). Wash Face and genitals (private parts)  with your normal soap.   6. Wash thoroughly, paying special attention to the area where your surgery will be performed.  7. Thoroughly rinse your body with warm water from the neck down.  8. DO NOT shower/wash with your normal soap after using and rinsing off the CHG Soap.  9. Pat yourself dry with a CLEAN TOWEL.  10. Wear CLEAN PAJAMAS to bed the night before surgery  11. Place CLEAN SHEETS on your bed the night of your first shower and DO NOT SLEEP WITH PETS.   Day of Surgery: Wear Clean/Comfortable clothing the morning of surgery Do not apply any deodorants/lotions.   Remember to brush your teeth WITH YOUR  REGULAR TOOTHPASTE.   Please read over the following fact sheets that you were given.

## 2020-03-31 NOTE — Anesthesia Preprocedure Evaluation (Addendum)
Anesthesia Evaluation  Patient identified by MRN, date of birth, ID band Patient awake    Reviewed: Allergy & Precautions, NPO status , Patient's Chart, lab work & pertinent test results  History of Anesthesia Complications Negative for: history of anesthetic complications  Airway Mallampati: III  TM Distance: >3 FB Neck ROM: Full    Dental no notable dental hx. (+) Dental Advisory Given, Teeth Intact   Pulmonary asthma , sleep apnea , former smoker,    Pulmonary exam normal        Cardiovascular hypertension, Pt. on medications and Pt. on home beta blockers + Peripheral Vascular Disease  Normal cardiovascular exam   The left ventricular systolic function is normal.  LV end diastolic pressure is normal.  The left ventricular ejection fraction is 55-65% by visual estimate.  There is no aortic valve stenosis.  No angiographically apparent CAD.  Dilated thoracic aorta without dissection.  Ao sat 98%, PA sat 80%, PA pressure 27/12, mean PCWP 9 mm Hg, CO 6.67; CI 2.97   Continue plans for elective aneurysm repair. Echo 03/23/20: IMPRESSIONS  1. Moderately dilated ascending aortic aneurysm with maximum diameter 49  mm. No prior study is available for comparison. A dedicated chest CTA or  MRA is recommended.  2. Left ventricular ejection fraction, by estimation, is 55 to 60%. The  left ventricle has normal function. The left ventricle has no regional  wall motion abnormalities. There is mild concentric left ventricular  hypertrophy. Left ventricular diastolic  parameters are consistent with Grade II diastolic dysfunction  (pseudonormalization).  3. Right ventricular systolic function is normal. The right ventricular  size is normal. There is normal pulmonary artery systolic pressure.  4. Left atrial size was mildly dilated.  5. The mitral valve is normal in structure. Mild mitral valve  regurgitation. No evidence of  mitral stenosis.  6. The aortic valve is normal in structure. Aortic valve regurgitation is  not visualized. No aortic stenosis is present.  7. Aortic dilatation noted. Aneurysm of the ascending aorta, measuring 49  mm. There is mild dilatation at the level of the sinuses of Valsalva,  measuring 40 mm.  8. The inferior vena cava is normal in size with greater than 50%  respiratory variability, suggesting right atrial pressure of 3 mmHg.     Neuro/Psych negative neurological ROS     GI/Hepatic GERD  ,(+) Cirrhosis       , colon cancer (s/p colon resection, colostomy 2015, ileostomy 2016 in [place]   Endo/Other  negative endocrine ROS  Renal/GU Renal InsufficiencyRenal disease     Musculoskeletal negative musculoskeletal ROS (+)   Abdominal   Peds  Hematology negative hematology ROS (+)   Anesthesia Other Findings   Reproductive/Obstetrics                          Anesthesia Physical Anesthesia Plan  ASA: III  Anesthesia Plan: General   Post-op Pain Management:    Induction: Intravenous  PONV Risk Score and Plan: 3 and Ondansetron, Midazolam and Treatment may vary due to age or medical condition  Airway Management Planned: Oral ETT  Additional Equipment: Arterial line, PA Cath, 3D TEE and Ultrasound Guidance Line Placement  Intra-op Plan:   Post-operative Plan: Post-operative intubation/ventilation  Informed Consent: I have reviewed the patients History and Physical, chart, labs and discussed the procedure including the risks, benefits and alternatives for the proposed anesthesia with the patient or authorized representative who has indicated his/her understanding  and acceptance.     Dental advisory given  Plan Discussed with: Anesthesiologist and CRNA  Anesthesia Plan Comments: (PAT note written 03/31/2020 by Myra Gianotti, PA-C. )      Anesthesia Quick Evaluation

## 2020-03-31 NOTE — Progress Notes (Signed)
Anesthesia Chart Review:  Case: [id number] Date/Time: 04/01/20 0715   Procedures:      THORACIC ASCENDING ANEURYSM REPAIR (AAA) (N/A )     TRANSESOPHAGEAL ECHOCARDIOGRAM (TEE) (N/A )   Anesthesia type: General   Pre-op diagnosis: TAA   Location: [facility] OR ROOM 15 / [place]   Surgeons: Wonda Olds, MD      DISCUSSION: Patient is a 49 year old male scheduled for the above procedure.   History includes former smoker (quit 12/31/88), ascending TAA, HTN, HLD, GERD, migraines, colon cancer (s/p colon resection, colostomy 2015, ileostomy 2016 in [place]), CKD (stage III), asthma, OSA (CPAP), NAFLD.   03/31/20 presurgical COVID-19 test is in process. Anesthesia team to evaluate on the day of surgery.    VS: BP 114/78   Pulse 60   Temp 36.6 C   Resp 18   Ht 6\' 2"  (1.88 m)   Wt 99.8 kg   SpO2 97%   BMI 28.25 kg/m    PROVIDERS: McGowen, Adrian Blackwater, MD is PCP [name], MD is cardiologist   LABS: Preoperative labs noted.  (all labs ordered are listed, but only abnormal results are displayed)  Labs Reviewed  CBC - Abnormal; Notable for the following components:      Result Value   RBC 5.96 (*)    Hemoglobin 18.4 (*)    Platelets 118 (*)    All other components within normal limits  COMPREHENSIVE METABOLIC PANEL - Abnormal; Notable for the following components:   Glucose, Bld 103 (*)    Total Bilirubin 1.3 (*)    All other components within normal limits  SURGICAL PCR SCREEN  PROTIME-INR  APTT  URINALYSIS, ROUTINE W REFLEX MICROSCOPIC  BLOOD GAS, ARTERIAL  HEMOGLOBIN A1C  TYPE AND SCREEN     IMAGES: CXR 03/31/20 (ordered by Dr. [name]): FINDINGS: Apparent nipple shadow on the left. No edema or airspace opacity. Heart size and pulmonary vascularity are within normal limits. No adenopathy. No bone lesions. IMPRESSION: Probable nipple shadow on the left; repeat study with nipple markers could confirm. No edema or airspace opacity. Cardiac silhouette within normal  limits.  CTA chest 02/19/20: IMPRESSION: 1. 4.7 cm ascending thoracic aortic aneurysm is noted which is slightly enlarged compared to prior exam. No dissection is noted. Recommend semi-annual imaging followup by CTA or MRA and referral to cardiothoracic surgery if not already obtained. This recommendation follows 2010 ACCF/AHA/AATS/ACR/ASA/SCA/SCAI/SIR/STS/SVM Guidelines for the Diagnosis and Management of Patients With Thoracic Aortic Disease. Circulation. 2010; 121: H675-F163. Aortic aneurysm NOS (ICD10-I71.9)   EKG: 03/26/20: NSR with sinus arrhythmia   CV: Carotid US 03/30/20: Summary:  Right Carotid: The extracranial vessels were near-normal with only minimal  wall         thickening or plaque.  Left Carotid: The extracranial vessels were near-normal with only minimal  wall        thickening or plaque.  Vertebrals: Bilateral vertebral arteries demonstrate antegrade flow.  Subclavians: Normal flow hemodynamics were seen in bilateral subclavian        arteries.    RHC/LHC 03/30/20:  The left ventricular systolic function is normal.  LV end diastolic pressure is normal.  The left ventricular ejection fraction is 55-65% by visual estimate.  There is no aortic valve stenosis.  No angiographically apparent CAD.  Dilated thoracic aorta without dissection.  Ao sat 98%, PA sat 80%, PA pressure 27/12, mean PCWP 9 mm Hg, CO 6.67; CI 2.97 Continue plans for elective aneurysm repair.   Echo 03/23/20:  IMPRESSIONS  1. Moderately dilated ascending aortic aneurysm with maximum diameter 49  mm. No prior study is available for comparison. A dedicated chest CTA or  MRA is recommended.  2. Left ventricular ejection fraction, by estimation, is 55 to 60%. The  left ventricle has normal function. The left ventricle has no regional  wall motion abnormalities. There is mild concentric left ventricular  hypertrophy. Left ventricular diastolic  parameters  are consistent with Grade II diastolic dysfunction  (pseudonormalization).  3. Right ventricular systolic function is normal. The right ventricular  size is normal. There is normal pulmonary artery systolic pressure.  4. Left atrial size was mildly dilated.  5. The mitral valve is normal in structure. Mild mitral valve  regurgitation. No evidence of mitral stenosis.  6. The aortic valve is normal in structure. Aortic valve regurgitation is  not visualized. No aortic stenosis is present.  7. Aortic dilatation noted. Aneurysm of the ascending aorta, measuring 49  mm. There is mild dilatation at the level of the sinuses of Valsalva,  measuring 40 mm.  8. The inferior vena cava is normal in size with greater than 50%  respiratory variability, suggesting right atrial pressure of 3 mmHg.    Past Medical History:  Diagnosis Date  . Ascending aortic aneurysm (HCC)    4.7 cm. 12/2018 imaging->stable. 02/2020 CT angio/aortoa slight progression->cardiol ref'd him to CT surg  . Cancer [facility])    colon cancers per doctor's notes  . Chronic renal insufficiency, stage 3 (moderate) (HCC)    GFR 55 ml/min  . Cirrhosis, nonalcoholic (HCC)    NAFLD  . Diverticulitis   . Essential hypertension   . Frequent headaches   . GERD (gastroesophageal reflux disease)   . History of colon cancer 2015   Surgery; but no chemo or rad was required.  Had a total of 5 surgeries, hx of colostomy, then an ileostomy.  [name] Hx of migraines   . Hyperlipemia, mixed    Dr. Percival Spanish d/c'd his fibrate and is considering switch/discontinuation of his statin (as of 05/2019)  . Insomnia   . Lateral epicondylitis    Recurrent, right: steroid injection by Dr. Junius Roads Sept and Nov 2020.  . Mild intermittent asthma   . Obesity, Class I, BMI 30-34.9     Past Surgical History:  Procedure Laterality Date  . ABI's  03/2019   Normal  . CARDIAC CATHETERIZATION  03/30/2020   No CAD.  [name] COLON SURGERY  2015   2 cm colon ca  excised.  . COLONOSCOPY  many   he has had colonoscopy q57mo since 2015.  Most recent was approx 04/2018->normal.  . COLOSTOMY  2015   diverticulitis   . ILEOSTOMY  2016  . RIGHT/LEFT HEART CATH AND CORONARY ANGIOGRAPHY N/A 03/30/2020   Procedure: RIGHT/LEFT HEART CATH AND CORONARY ANGIOGRAPHY;  Surgeon: Jettie Booze, MD;  Location: [place] [facility];  Service: Cardiovascular;  Laterality: N/A;  . THORACIC AORTOGRAM N/A 03/30/2020   Procedure: THORACIC AORTOGRAM;  Surgeon: Jettie Booze, MD;  Location: [place] [facility];  Service: Cardiovascular;  Laterality: N/A;  . TRANSTHORACIC ECHOCARDIOGRAM  03/23/2020   49 mm ascend aort aneur.  EF 55-60%, grd II DD, valves normal.    MEDICATIONS: . albuterol (VENTOLIN HFA) 108 (90 Base) MCG/ACT inhaler  . amLODipine (NORVASC) 10 MG tablet  . aspirin EC 81 MG tablet  . cetirizine (ZYRTEC) 10 MG tablet  . cyanocobalamin (,VITAMIN B-12,) 1000 MCG/ML injection  . metoprolol succinate (TOPROL-XL) 25  MG 24 hr tablet  . omeprazole (PRILOSEC) 20 MG capsule  . rosuvastatin (CRESTOR) 20 MG tablet  . valsartan (DIOVAN) 160 MG tablet   No current facility-administered medications for this encounter.   Derrill Memo ON 04/01/2020] dexmedetomidine (PRECEDEX) 400 MCG/100ML (4 mcg/mL) infusion  . [START ON 04/01/2020] EPINEPHrine (ADRENALIN) 4 mg in NS 250 mL (0.016 mg/mL) premix infusion  . [START ON 04/01/2020] heparin 30,000 units/NS 1000 mL solution for CELLSAVER  . [START ON 04/01/2020] heparin sodium (porcine) 2,500 Units, papaverine 30 mg in electrolyte-148 (PLASMALYTE-148) 500 mL irrigation  . [START ON 04/01/2020] insulin regular, human (MYXREDLIN) 100 units/ 100 mL infusion  . [place] Blood Cardioplegia vial (lidocaine/magnesium/mannitol 0.26g-4g-6.4g)  . [START ON 04/01/2020] levofloxacin (LEVAQUIN) IVPB 500 mg  . [START ON 04/01/2020] milrinone (PRIMACOR) 20 MG/100 ML (0.2 mg/mL) infusion  . [START ON 04/01/2020] nitroGLYCERIN 50 mg  in dextrose 5 % 250 mL (0.2 mg/mL) infusion  . [START ON 04/01/2020] norepinephrine (LEVOPHED) 4mg  in 250mL premix infusion  . [START ON 04/01/2020] phenylephrine (NEOSYNEPHRINE) 20-0.9 MG/250ML-% infusion  . [START ON 04/01/2020] potassium chloride injection 80 mEq  . [START ON 04/01/2020] tranexamic acid (CYKLOKAPRON) 2,500 mg in sodium chloride 0.9 % 250 mL (10 mg/mL) infusion  . [START ON 04/01/2020] tranexamic acid (CYKLOKAPRON) bolus via infusion - over 30 minutes 1,497 mg  . [START ON 04/01/2020] tranexamic acid (CYKLOKAPRON) pump prime solution 200 mg  . [START ON 04/01/2020] vancomycin (VANCOREADY) IVPB 1500 mg/300 mL    Myra Gianotti, PA-C Surgical Short Stay/Anesthesiology [facility] Phone [phone] [facility] Phone [phone] 03/31/2020 3:03 PM

## 2020-04-01 ENCOUNTER — Inpatient Hospital Stay (HOSPITAL_COMMUNITY): Payer: BC Managed Care – PPO | Admitting: Anesthesiology

## 2020-04-01 ENCOUNTER — Encounter (HOSPITAL_COMMUNITY)
Admission: RE | Disposition: A | Discharge status: Home / Self Care | Payer: Self-pay | Source: Home / Self Care | Attending: Cardiothoracic Surgery

## 2020-04-01 ENCOUNTER — Inpatient Hospital Stay (HOSPITAL_COMMUNITY)
Admission: RE | Admit: 2020-04-01 | Discharge: 2020-04-08 | DRG: 217 | Disposition: A | Discharge status: Home Health | Payer: BC Managed Care – PPO | Attending: Cardiothoracic Surgery | Admitting: Cardiothoracic Surgery

## 2020-04-01 ENCOUNTER — Inpatient Hospital Stay (HOSPITAL_COMMUNITY): Payer: BC Managed Care – PPO

## 2020-04-01 ENCOUNTER — Encounter (HOSPITAL_COMMUNITY): Payer: Self-pay | Admitting: Cardiothoracic Surgery

## 2020-04-01 DIAGNOSIS — Z933 Colostomy status: Secondary | ICD-10-CM | POA: Diagnosis not present

## 2020-04-01 DIAGNOSIS — Z7982 Long term (current) use of aspirin: Secondary | ICD-10-CM | POA: Diagnosis not present

## 2020-04-01 DIAGNOSIS — E782 Mixed hyperlipidemia: Secondary | ICD-10-CM | POA: Diagnosis present

## 2020-04-01 DIAGNOSIS — Z09 Encounter for follow-up examination after completed treatment for conditions other than malignant neoplasm: Secondary | ICD-10-CM

## 2020-04-01 DIAGNOSIS — Z8249 Family history of ischemic heart disease and other diseases of the circulatory system: Secondary | ICD-10-CM

## 2020-04-01 DIAGNOSIS — N183 Chronic kidney disease, stage 3 unspecified: Secondary | ICD-10-CM | POA: Diagnosis present

## 2020-04-01 DIAGNOSIS — Z807 Family history of other malignant neoplasms of lymphoid, hematopoietic and related tissues: Secondary | ICD-10-CM

## 2020-04-01 DIAGNOSIS — R0902 Hypoxemia: Secondary | ICD-10-CM | POA: Diagnosis not present

## 2020-04-01 DIAGNOSIS — Z20822 Contact with and (suspected) exposure to covid-19: Secondary | ICD-10-CM | POA: Diagnosis present

## 2020-04-01 DIAGNOSIS — Z85038 Personal history of other malignant neoplasm of large intestine: Secondary | ICD-10-CM | POA: Diagnosis not present

## 2020-04-01 DIAGNOSIS — F419 Anxiety disorder, unspecified: Secondary | ICD-10-CM | POA: Diagnosis present

## 2020-04-01 DIAGNOSIS — D62 Acute posthemorrhagic anemia: Secondary | ICD-10-CM | POA: Diagnosis not present

## 2020-04-01 DIAGNOSIS — G4700 Insomnia, unspecified: Secondary | ICD-10-CM | POA: Diagnosis present

## 2020-04-01 DIAGNOSIS — K219 Gastro-esophageal reflux disease without esophagitis: Secondary | ICD-10-CM | POA: Diagnosis present

## 2020-04-01 DIAGNOSIS — K760 Fatty (change of) liver, not elsewhere classified: Secondary | ICD-10-CM | POA: Diagnosis present

## 2020-04-01 DIAGNOSIS — I712 Thoracic aortic aneurysm, without rupture, unspecified: Secondary | ICD-10-CM

## 2020-04-01 DIAGNOSIS — K7460 Unspecified cirrhosis of liver: Secondary | ICD-10-CM | POA: Diagnosis present

## 2020-04-01 DIAGNOSIS — J4520 Mild intermittent asthma, uncomplicated: Secondary | ICD-10-CM | POA: Diagnosis present

## 2020-04-01 DIAGNOSIS — I129 Hypertensive chronic kidney disease with stage 1 through stage 4 chronic kidney disease, or unspecified chronic kidney disease: Secondary | ICD-10-CM | POA: Diagnosis present

## 2020-04-01 DIAGNOSIS — R739 Hyperglycemia, unspecified: Secondary | ICD-10-CM | POA: Diagnosis not present

## 2020-04-01 DIAGNOSIS — Z87891 Personal history of nicotine dependence: Secondary | ICD-10-CM

## 2020-04-01 DIAGNOSIS — Z9689 Presence of other specified functional implants: Secondary | ICD-10-CM

## 2020-04-01 DIAGNOSIS — I719 Aortic aneurysm of unspecified site, without rupture: Secondary | ICD-10-CM

## 2020-04-01 DIAGNOSIS — Z800 Family history of malignant neoplasm of digestive organs: Secondary | ICD-10-CM | POA: Diagnosis not present

## 2020-04-01 DIAGNOSIS — Z9889 Other specified postprocedural states: Secondary | ICD-10-CM

## 2020-04-01 DIAGNOSIS — Z8679 Personal history of other diseases of the circulatory system: Secondary | ICD-10-CM

## 2020-04-01 DIAGNOSIS — Z79899 Other long term (current) drug therapy: Secondary | ICD-10-CM | POA: Diagnosis not present

## 2020-04-01 HISTORY — PX: THORACIC AORTIC ANEURYSM REPAIR: SHX799

## 2020-04-01 HISTORY — PX: TEE WITHOUT CARDIOVERSION: SHX5443

## 2020-04-01 LAB — POCT I-STAT, CHEM 8
BUN: 16 mg/dL (ref 6–20)
BUN: 16 mg/dL (ref 6–20)
BUN: 17 mg/dL (ref 6–20)
BUN: 17 mg/dL (ref 6–20)
BUN: 18 mg/dL (ref 6–20)
Calcium, Ion: 1.27 mmol/L (ref 1.15–1.40)
Calcium, Ion: 1.24 mmol/L (ref 1.15–1.40)
Calcium, Ion: 1.08 mmol/L — ABNORMAL LOW (ref 1.15–1.40)
Calcium, Ion: 1.02 mmol/L — ABNORMAL LOW (ref 1.15–1.40)
Calcium, Ion: 1.29 mmol/L (ref 1.15–1.40)
Chloride: 104 mmol/L (ref 98–111)
Chloride: 105 mmol/L (ref 98–111)
Chloride: 102 mmol/L (ref 98–111)
Chloride: 102 mmol/L (ref 98–111)
Chloride: 104 mmol/L (ref 98–111)
Creatinine, Ser: 0.9 mg/dL (ref 0.61–1.24)
Creatinine, Ser: 0.9 mg/dL (ref 0.61–1.24)
Creatinine, Ser: 0.9 mg/dL (ref 0.61–1.24)
Creatinine, Ser: 0.8 mg/dL (ref 0.61–1.24)
Creatinine, Ser: 0.8 mg/dL (ref 0.61–1.24)
Glucose, Bld: 158 mg/dL — ABNORMAL HIGH (ref 70–99)
Glucose, Bld: 129 mg/dL — ABNORMAL HIGH (ref 70–99)
Glucose, Bld: 152 mg/dL — ABNORMAL HIGH (ref 70–99)
Glucose, Bld: 155 mg/dL — ABNORMAL HIGH (ref 70–99)
Glucose, Bld: 157 mg/dL — ABNORMAL HIGH (ref 70–99)
HCT: 45 % (ref 39.0–52.0)
HCT: 44 % (ref 39.0–52.0)
HCT: 37 % — ABNORMAL LOW (ref 39.0–52.0)
HCT: 31 % — ABNORMAL LOW (ref 39.0–52.0)
HCT: 33 % — ABNORMAL LOW (ref 39.0–52.0)
Hemoglobin: 15.3 g/dL (ref 13.0–17.0)
Hemoglobin: 15 g/dL (ref 13.0–17.0)
Hemoglobin: 12.6 g/dL — ABNORMAL LOW (ref 13.0–17.0)
Hemoglobin: 10.5 g/dL — ABNORMAL LOW (ref 13.0–17.0)
Hemoglobin: 11.2 g/dL — ABNORMAL LOW (ref 13.0–17.0)
Potassium: 4.3 mmol/L (ref 3.5–5.1)
Potassium: 4.8 mmol/L (ref 3.5–5.1)
Potassium: 4.3 mmol/L (ref 3.5–5.1)
Potassium: 4.6 mmol/L (ref 3.5–5.1)
Potassium: 5.2 mmol/L — ABNORMAL HIGH (ref 3.5–5.1)
Sodium: 139 mmol/L (ref 135–145)
Sodium: 138 mmol/L (ref 135–145)
Sodium: 136 mmol/L (ref 135–145)
Sodium: 137 mmol/L (ref 135–145)
Sodium: 138 mmol/L (ref 135–145)
TCO2: 23 mmol/L (ref 22–32)
TCO2: 23 mmol/L (ref 22–32)
TCO2: 22 mmol/L (ref 22–32)
TCO2: 22 mmol/L (ref 22–32)
TCO2: 24 mmol/L (ref 22–32)

## 2020-04-01 LAB — CBC
HCT: 49.2 % (ref 39.0–52.0)
HCT: 45 % (ref 39.0–52.0)
Hemoglobin: 17.2 g/dL — ABNORMAL HIGH (ref 13.0–17.0)
Hemoglobin: 15.7 g/dL (ref 13.0–17.0)
MCH: 30.3 pg (ref 26.0–34.0)
MCH: 30.1 pg (ref 26.0–34.0)
MCHC: 35 g/dL (ref 30.0–36.0)
MCHC: 34.9 g/dL (ref 30.0–36.0)
MCV: 86.8 fL (ref 80.0–100.0)
MCV: 86.2 fL (ref 80.0–100.0)
Platelets: 129 10*3/uL — ABNORMAL LOW (ref 150–400)
Platelets: 114 10*3/uL — ABNORMAL LOW (ref 150–400)
RBC: 5.67 MIL/uL (ref 4.22–5.81)
RBC: 5.22 MIL/uL (ref 4.22–5.81)
RDW: 12.5 % (ref 11.5–15.5)
RDW: 12.7 % (ref 11.5–15.5)
WBC: 18.9 10*3/uL — ABNORMAL HIGH (ref 4.0–10.5)
WBC: 16.3 10*3/uL — ABNORMAL HIGH (ref 4.0–10.5)
nRBC: 0 % (ref 0.0–0.2)
nRBC: 0 % (ref 0.0–0.2)

## 2020-04-01 LAB — BASIC METABOLIC PANEL
Anion gap: 10 (ref 5–15)
BUN: 15 mg/dL (ref 6–20)
CO2: 21 mmol/L — ABNORMAL LOW (ref 22–32)
Calcium: 7.7 mg/dL — ABNORMAL LOW (ref 8.9–10.3)
Chloride: 107 mmol/L (ref 98–111)
Creatinine, Ser: 1.03 mg/dL (ref 0.61–1.24)
GFR, Estimated: >60 mL/min (ref >60)
Glucose, Bld: 182 mg/dL — ABNORMAL HIGH (ref 70–99)
Potassium: 4.4 mmol/L (ref 3.5–5.1)
Sodium: 138 mmol/L (ref 135–145)

## 2020-04-01 LAB — POCT I-STAT 7, (LYTES, BLD GAS, ICA,H+H)
Acid-Base Excess: 0 mmol/L (ref 0.0–2.0)
Acid-base deficit: 3 mmol/L — ABNORMAL HIGH (ref 0.0–2.0)
Acid-base deficit: 2 mmol/L (ref 0.0–2.0)
Acid-base deficit: 3 mmol/L — ABNORMAL HIGH (ref 0.0–2.0)
Acid-base deficit: 4 mmol/L — ABNORMAL HIGH (ref 0.0–2.0)
Acid-base deficit: 3 mmol/L — ABNORMAL HIGH (ref 0.0–2.0)
Bicarbonate: 23.9 mmol/L (ref 20.0–28.0)
Bicarbonate: 27.2 mmol/L (ref 20.0–28.0)
Bicarbonate: 22.3 mmol/L (ref 20.0–28.0)
Bicarbonate: 23.1 mmol/L (ref 20.0–28.0)
Bicarbonate: 22.4 mmol/L (ref 20.0–28.0)
Bicarbonate: 22.5 mmol/L (ref 20.0–28.0)
Calcium, Ion: 1.29 mmol/L (ref 1.15–1.40)
Calcium, Ion: 1.1 mmol/L — ABNORMAL LOW (ref 1.15–1.40)
Calcium, Ion: 0.98 mmol/L — ABNORMAL LOW (ref 1.15–1.40)
Calcium, Ion: 1.19 mmol/L (ref 1.15–1.40)
Calcium, Ion: 1.15 mmol/L (ref 1.15–1.40)
Calcium, Ion: 1.13 mmol/L — ABNORMAL LOW (ref 1.15–1.40)
HCT: 45 % (ref 39.0–52.0)
HCT: 36 % — ABNORMAL LOW (ref 39.0–52.0)
HCT: 29 % — ABNORMAL LOW (ref 39.0–52.0)
HCT: 47 % (ref 39.0–52.0)
HCT: 43 % (ref 39.0–52.0)
HCT: 43 % (ref 39.0–52.0)
Hemoglobin: 15.3 g/dL (ref 13.0–17.0)
Hemoglobin: 12.2 g/dL — ABNORMAL LOW (ref 13.0–17.0)
Hemoglobin: 9.9 g/dL — ABNORMAL LOW (ref 13.0–17.0)
Hemoglobin: 16 g/dL (ref 13.0–17.0)
Hemoglobin: 14.6 g/dL (ref 13.0–17.0)
Hemoglobin: 14.6 g/dL (ref 13.0–17.0)
O2 Saturation: 100 %
O2 Saturation: 100 %
O2 Saturation: 100 %
O2 Saturation: 99 %
O2 Saturation: 98 %
O2 Saturation: 94 %
Patient temperature: 36
Patient temperature: 36.1
Patient temperature: 36.7
Potassium: 4.3 mmol/L (ref 3.5–5.1)
Potassium: 4.6 mmol/L (ref 3.5–5.1)
Potassium: 4.9 mmol/L (ref 3.5–5.1)
Potassium: 4.7 mmol/L (ref 3.5–5.1)
Potassium: 4.6 mmol/L (ref 3.5–5.1)
Potassium: 4.6 mmol/L (ref 3.5–5.1)
Sodium: 139 mmol/L (ref 135–145)
Sodium: 140 mmol/L (ref 135–145)
Sodium: 138 mmol/L (ref 135–145)
Sodium: 140 mmol/L (ref 135–145)
Sodium: 141 mmol/L (ref 135–145)
Sodium: 141 mmol/L (ref 135–145)
TCO2: 25 mmol/L (ref 22–32)
TCO2: 29 mmol/L (ref 22–32)
TCO2: 23 mmol/L (ref 22–32)
TCO2: 24 mmol/L (ref 22–32)
TCO2: 24 mmol/L (ref 22–32)
TCO2: 24 mmol/L (ref 22–32)
pCO2 arterial: 49.8 mmHg — ABNORMAL HIGH (ref 32.0–48.0)
pCO2 arterial: 54.1 mmHg — ABNORMAL HIGH (ref 32.0–48.0)
pCO2 arterial: 33.4 mmHg (ref 32.0–48.0)
pCO2 arterial: 43.2 mmHg (ref 32.0–48.0)
pCO2 arterial: 41.3 mmHg (ref 32.0–48.0)
pCO2 arterial: 42.6 mmHg (ref 32.0–48.0)
pH, Arterial: 7.289 — ABNORMAL LOW (ref 7.350–7.450)
pH, Arterial: 7.31 — ABNORMAL LOW (ref 7.350–7.450)
pH, Arterial: 7.432 (ref 7.350–7.450)
pH, Arterial: 7.331 — ABNORMAL LOW (ref 7.350–7.450)
pH, Arterial: 7.337 — ABNORMAL LOW (ref 7.350–7.450)
pH, Arterial: 7.33 — ABNORMAL LOW (ref 7.350–7.450)
pO2, Arterial: 191 mmHg — ABNORMAL HIGH (ref 83.0–108.0)
pO2, Arterial: 433 mmHg — ABNORMAL HIGH (ref 83.0–108.0)
pO2, Arterial: 210 mmHg — ABNORMAL HIGH (ref 83.0–108.0)
pO2, Arterial: 136 mmHg — ABNORMAL HIGH (ref 83.0–108.0)
pO2, Arterial: 103 mmHg (ref 83.0–108.0)
pO2, Arterial: 74 mmHg — ABNORMAL LOW (ref 83.0–108.0)

## 2020-04-01 LAB — GLUCOSE, CAPILLARY
Glucose-Capillary: 101 mg/dL — ABNORMAL HIGH (ref 70–99)
Glucose-Capillary: 123 mg/dL — ABNORMAL HIGH (ref 70–99)
Glucose-Capillary: 125 mg/dL — ABNORMAL HIGH (ref 70–99)
Glucose-Capillary: 127 mg/dL — ABNORMAL HIGH (ref 70–99)
Glucose-Capillary: 132 mg/dL — ABNORMAL HIGH (ref 70–99)
Glucose-Capillary: 139 mg/dL — ABNORMAL HIGH (ref 70–99)
Glucose-Capillary: 149 mg/dL — ABNORMAL HIGH (ref 70–99)
Glucose-Capillary: 154 mg/dL — ABNORMAL HIGH (ref 70–99)
Glucose-Capillary: 78 mg/dL (ref 70–99)

## 2020-04-01 LAB — POCT I-STAT EG7
Acid-base deficit: 2 mmol/L (ref 0.0–2.0)
Bicarbonate: 25.9 mmol/L (ref 20.0–28.0)
Calcium, Ion: 1.13 mmol/L — ABNORMAL LOW (ref 1.15–1.40)
HCT: 38 % — ABNORMAL LOW (ref 39.0–52.0)
Hemoglobin: 12.9 g/dL — ABNORMAL LOW (ref 13.0–17.0)
O2 Saturation: 91 %
Potassium: 4.7 mmol/L (ref 3.5–5.1)
Sodium: 140 mmol/L (ref 135–145)
TCO2: 27 mmol/L (ref 22–32)
pCO2, Ven: 54.1 mmHg (ref 44.0–60.0)
pH, Ven: 7.287 (ref 7.250–7.430)
pO2, Ven: 68 mmHg — ABNORMAL HIGH (ref 32.0–45.0)

## 2020-04-01 LAB — MAGNESIUM: Magnesium: 2.8 mg/dL — ABNORMAL HIGH (ref 1.7–2.4)

## 2020-04-01 LAB — HEMOGLOBIN AND HEMATOCRIT, BLOOD
HCT: 36.4 % — ABNORMAL LOW (ref 39.0–52.0)
Hemoglobin: 12.7 g/dL — ABNORMAL LOW (ref 13.0–17.0)

## 2020-04-01 LAB — APTT: aPTT: 32 seconds (ref 24–36)

## 2020-04-01 LAB — FIBRINOGEN: Fibrinogen: 208 mg/dL — ABNORMAL LOW (ref 210–475)

## 2020-04-01 LAB — ECHO INTRAOPERATIVE TEE
Height: 74 in
Weight: 3440 oz

## 2020-04-01 LAB — PROTIME-INR
INR: 1.5 — ABNORMAL HIGH (ref 0.8–1.2)
Prothrombin Time: 17.3 seconds — ABNORMAL HIGH (ref 11.4–15.2)

## 2020-04-01 LAB — PLATELET COUNT: Platelets: 76 10*3/uL — ABNORMAL LOW (ref 150–400)

## 2020-04-01 LAB — PREPARE RBC (CROSSMATCH)

## 2020-04-01 LAB — ABO/RH: ABO/RH(D): O POS

## 2020-04-01 SURGERY — REPAIR, ANEURYSM, AORTA, THORACIC, ASCENDING
Anesthesia: General | Site: Chest

## 2020-04-01 MED ORDER — PROPOFOL 10 MG/ML IV BOLUS
INTRAVENOUS | Status: AC
  Filled 2020-04-01: qty 20

## 2020-04-01 MED ORDER — ROCURONIUM BROMIDE 10 MG/ML (PF) SYRINGE
PREFILLED_SYRINGE | INTRAVENOUS | Status: DC | PRN
  Administered 2020-04-01: 100 mg via INTRAVENOUS
  Administered 2020-04-01 (×3): 50 mg via INTRAVENOUS

## 2020-04-01 MED ORDER — POTASSIUM CHLORIDE 10 MEQ/50ML IV SOLN
10.0000 meq | INTRAVENOUS | Status: AC
  Filled 2020-04-01: qty 50

## 2020-04-01 MED ORDER — LEVOFLOXACIN IN D5W 750 MG/150ML IV SOLN
750.0000 mg | INTRAVENOUS | Status: AC
  Administered 2020-04-02: 11:00:00 750 mg via INTRAVENOUS
  Filled 2020-04-01: qty 150

## 2020-04-01 MED ORDER — VANCOMYCIN HCL 1000 MG IV SOLR
INTRAVENOUS | Status: DC | PRN
  Administered 2020-04-01 (×3): 1000 mg via TOPICAL

## 2020-04-01 MED ORDER — ASPIRIN EC 325 MG PO TBEC
325.0000 mg | DELAYED_RELEASE_TABLET | Freq: Every day | ORAL | Status: DC
  Administered 2020-04-02 – 2020-04-04 (×3): 325 mg via ORAL
  Filled 2020-04-01 (×3): qty 1

## 2020-04-01 MED ORDER — FENTANYL CITRATE (PF) 250 MCG/5ML IJ SOLN
INTRAMUSCULAR | Status: DC | PRN
  Administered 2020-04-01: 150 ug via INTRAVENOUS
  Administered 2020-04-01 (×2): 100 ug via INTRAVENOUS
  Administered 2020-04-01: 150 ug via INTRAVENOUS
  Administered 2020-04-01: 50 ug via INTRAVENOUS
  Administered 2020-04-01: 100 ug via INTRAVENOUS
  Administered 2020-04-01: 150 ug via INTRAVENOUS
  Administered 2020-04-01 (×2): 100 ug via INTRAVENOUS

## 2020-04-01 MED ORDER — TRANEXAMIC ACID 1000 MG/10ML IV SOLN
1.5000 mg/kg/h | INTRAVENOUS | Status: DC
  Administered 2020-04-01: 12:00:00 1.5 mg/kg/h via INTRAVENOUS
  Filled 2020-04-01: qty 25

## 2020-04-01 MED ORDER — PLATELET POOR PLASMA OPTIME
Status: DC | PRN
  Administered 2020-04-01: 10:00:00 10 mL

## 2020-04-01 MED ORDER — PROPOFOL 10 MG/ML IV BOLUS
INTRAVENOUS | Status: DC | PRN
  Administered 2020-04-01 (×2): 50 mg via INTRAVENOUS
  Administered 2020-04-01: 40 mg via INTRAVENOUS
  Administered 2020-04-01: 160 mg via INTRAVENOUS

## 2020-04-01 MED ORDER — DEXTROSE 50 % IV SOLN: 0.0000 mL | INTRAVENOUS | Status: DC | PRN

## 2020-04-01 MED ORDER — HEMOSTATIC AGENTS (NO CHARGE) OPTIME
TOPICAL | Status: DC | PRN
  Administered 2020-04-01 (×3): 1 via TOPICAL

## 2020-04-01 MED ORDER — MIDAZOLAM HCL 2 MG/2ML IJ SOLN
INTRAMUSCULAR | Status: AC
  Filled 2020-04-01: qty 2

## 2020-04-01 MED ORDER — LIDOCAINE 2% (20 MG/ML) 5 ML SYRINGE
INTRAMUSCULAR | Status: DC | PRN
  Administered 2020-04-01: 100 mg via INTRAVENOUS

## 2020-04-01 MED ORDER — PROMETHAZINE HCL 25 MG/ML IJ SOLN
6.2500 mg | Freq: Four times a day (QID) | INTRAMUSCULAR | Status: DC | PRN
  Administered 2020-04-01: 21:00:00 6.25 mg via INTRAVENOUS
  Filled 2020-04-01: qty 1

## 2020-04-01 MED ORDER — LACTATED RINGERS IV SOLN: INTRAVENOUS | Status: DC | PRN

## 2020-04-01 MED ORDER — FENTANYL CITRATE (PF) 100 MCG/2ML IJ SOLN
50.0000 ug | INTRAMUSCULAR | Status: DC | PRN
  Administered 2020-04-01: 100 ug via INTRAVENOUS
  Administered 2020-04-01: 50 ug via INTRAVENOUS
  Administered 2020-04-01: 100 ug via INTRAVENOUS
  Administered 2020-04-01: 50 ug via INTRAVENOUS
  Administered 2020-04-02 (×6): 100 ug via INTRAVENOUS
  Filled 2020-04-01 (×10): qty 2

## 2020-04-01 MED ORDER — DOCUSATE SODIUM 100 MG PO CAPS
200.0000 mg | ORAL_CAPSULE | Freq: Every day | ORAL | Status: DC
  Administered 2020-04-02 – 2020-04-04 (×3): 200 mg via ORAL
  Filled 2020-04-01 (×3): qty 2

## 2020-04-01 MED ORDER — METOPROLOL TARTRATE 12.5 MG HALF TABLET
12.5000 mg | ORAL_TABLET | Freq: Two times a day (BID) | ORAL | Status: DC
  Administered 2020-04-02 – 2020-04-04 (×5): 12.5 mg via ORAL
  Filled 2020-04-01 (×5): qty 1

## 2020-04-01 MED ORDER — SODIUM CHLORIDE 0.9% IV SOLUTION: Freq: Once | INTRAVENOUS | Status: DC

## 2020-04-01 MED ORDER — CHLORHEXIDINE GLUCONATE 0.12 % MT SOLN
15.0000 mL | OROMUCOSAL | Status: AC
  Administered 2020-04-01: 18:00:00 15 mL via OROMUCOSAL

## 2020-04-01 MED ORDER — LACTATED RINGERS IV SOLN: 500.0000 mL | Freq: Once | INTRAVENOUS | Status: DC | PRN

## 2020-04-01 MED ORDER — SODIUM BICARBONATE 8.4 % IV SOLN
50.0000 meq | Freq: Once | INTRAVENOUS | Status: AC
  Administered 2020-04-01: 14:00:00 50 meq via INTRAVENOUS

## 2020-04-01 MED ORDER — SODIUM CHLORIDE 0.9% FLUSH
3.0000 mL | Freq: Two times a day (BID) | INTRAVENOUS | Status: DC
  Administered 2020-04-02 – 2020-04-03 (×4): 3 mL via INTRAVENOUS

## 2020-04-01 MED ORDER — FAMOTIDINE IN NACL 20-0.9 MG/50ML-% IV SOLN
20.0000 mg | Freq: Two times a day (BID) | INTRAVENOUS | Status: AC
  Administered 2020-04-01 (×2): 20 mg via INTRAVENOUS
  Filled 2020-04-01 (×4): qty 50

## 2020-04-01 MED ORDER — ACETAMINOPHEN 10 MG/ML IV SOLN
1000.0000 mg | Freq: Four times a day (QID) | INTRAVENOUS | Status: AC
  Administered 2020-04-02 (×2): 1000 mg via INTRAVENOUS
  Filled 2020-04-01 (×4): qty 100

## 2020-04-01 MED ORDER — TRAMADOL HCL 50 MG PO TABS
50.0000 mg | ORAL_TABLET | ORAL | Status: DC | PRN
  Administered 2020-04-02 – 2020-04-03 (×6): 100 mg via ORAL
  Administered 2020-04-04 (×2): 50 mg via ORAL
  Filled 2020-04-01: qty 1
  Filled 2020-04-01 (×7): qty 2

## 2020-04-01 MED ORDER — METOPROLOL TARTRATE 5 MG/5ML IV SOLN
2.5000 mg | INTRAVENOUS | Status: DC | PRN
  Administered 2020-04-04: 5 mg via INTRAVENOUS
  Filled 2020-04-01: qty 5

## 2020-04-01 MED ORDER — CHLORHEXIDINE GLUCONATE 0.12 % MT SOLN
15.0000 mL | Freq: Once | OROMUCOSAL | Status: DC
  Filled 2020-04-01: qty 15

## 2020-04-01 MED ORDER — SODIUM CHLORIDE 0.9 % IV SOLN: 250.0000 mL | INTRAVENOUS | Status: DC

## 2020-04-01 MED ORDER — SODIUM CHLORIDE 0.9 % IV SOLN: INTRAVENOUS | Status: DC

## 2020-04-01 MED ORDER — MIDAZOLAM HCL (PF) 10 MG/2ML IJ SOLN
INTRAMUSCULAR | Status: AC
  Filled 2020-04-01: qty 2

## 2020-04-01 MED ORDER — SODIUM CHLORIDE 0.9% FLUSH: 3.0000 mL | INTRAVENOUS | Status: DC | PRN

## 2020-04-01 MED ORDER — FENTANYL CITRATE (PF) 250 MCG/5ML IJ SOLN
INTRAMUSCULAR | Status: AC
  Filled 2020-04-01: qty 20

## 2020-04-01 MED ORDER — NITROGLYCERIN IN D5W 200-5 MCG/ML-% IV SOLN: 0.0000 ug/min | INTRAVENOUS | Status: DC

## 2020-04-01 MED ORDER — BISACODYL 10 MG RE SUPP: 10.0000 mg | Freq: Every day | RECTAL | Status: DC

## 2020-04-01 MED ORDER — LACTATED RINGERS IV SOLN: INTRAVENOUS | Status: DC

## 2020-04-01 MED ORDER — ASPIRIN 81 MG PO CHEW: 324.0000 mg | CHEWABLE_TABLET | Freq: Every day | ORAL | Status: DC

## 2020-04-01 MED ORDER — VANCOMYCIN HCL IN DEXTROSE 1-5 GM/200ML-% IV SOLN
1000.0000 mg | Freq: Once | INTRAVENOUS | Status: AC
  Administered 2020-04-01: 19:00:00 1000 mg via INTRAVENOUS
  Filled 2020-04-01: qty 200

## 2020-04-01 MED ORDER — ALBUMIN HUMAN 5 % IV SOLN
250.0000 mL | INTRAVENOUS | Status: AC | PRN
  Administered 2020-04-01 (×4): 12.5 g via INTRAVENOUS
  Filled 2020-04-01: qty 250

## 2020-04-01 MED ORDER — VANCOMYCIN HCL 1000 MG IV SOLR
INTRAVENOUS | Status: AC
  Filled 2020-04-01: qty 3000

## 2020-04-01 MED ORDER — PROTAMINE SULFATE 10 MG/ML IV SOLN
INTRAVENOUS | Status: DC | PRN
  Administered 2020-04-01: 300 mg via INTRAVENOUS

## 2020-04-01 MED ORDER — THROMBIN 5000 UNITS EX SOLR
INTRAVENOUS | Status: DC | PRN
  Administered 2020-04-01: 10:00:00 2 mL

## 2020-04-01 MED ORDER — ACETAMINOPHEN 160 MG/5ML PO SOLN: 1000.0000 mg | Freq: Four times a day (QID) | ORAL | Status: DC

## 2020-04-01 MED ORDER — ACETAMINOPHEN 650 MG RE SUPP
650.0000 mg | Freq: Once | RECTAL | Status: AC
  Administered 2020-04-01: 16:00:00 650 mg via RECTAL

## 2020-04-01 MED ORDER — ACETAMINOPHEN 160 MG/5ML PO SOLN: 650.0000 mg | Freq: Once | ORAL | Status: AC

## 2020-04-01 MED ORDER — PROPOFOL 1000 MG/100ML IV EMUL
INTRAVENOUS | Status: AC
  Filled 2020-04-01: qty 100

## 2020-04-01 MED ORDER — METHYLPREDNISOLONE SODIUM SUCC 125 MG IJ SOLR
125.0000 mg | INTRAMUSCULAR | Status: DC
  Filled 2020-04-01: qty 2

## 2020-04-01 MED ORDER — ACETAMINOPHEN 500 MG PO TABS
1000.0000 mg | ORAL_TABLET | Freq: Four times a day (QID) | ORAL | Status: DC
  Administered 2020-04-01 – 2020-04-04 (×6): 1000 mg via ORAL
  Filled 2020-04-01 (×7): qty 2

## 2020-04-01 MED ORDER — PANTOPRAZOLE SODIUM 40 MG PO TBEC
40.0000 mg | DELAYED_RELEASE_TABLET | Freq: Every day | ORAL | Status: DC
  Administered 2020-04-03 – 2020-04-04 (×2): 40 mg via ORAL
  Filled 2020-04-01 (×3): qty 1

## 2020-04-01 MED ORDER — PHENYLEPHRINE HCL-NACL 20-0.9 MG/250ML-% IV SOLN
0.0000 ug/min | INTRAVENOUS | Status: DC
  Administered 2020-04-01: 18:00:00 40 ug/min via INTRAVENOUS
  Administered 2020-04-02: 01:00:00 45 ug/min via INTRAVENOUS
  Filled 2020-04-01 (×2): qty 250

## 2020-04-01 MED ORDER — DEXMEDETOMIDINE HCL IN NACL 400 MCG/100ML IV SOLN
0.0000 ug/kg/h | INTRAVENOUS | Status: DC
  Administered 2020-04-01: 15:00:00 0.7 ug/kg/h via INTRAVENOUS
  Filled 2020-04-01: qty 100

## 2020-04-01 MED ORDER — OXYCODONE HCL 5 MG PO TABS
5.0000 mg | ORAL_TABLET | ORAL | Status: DC | PRN
  Administered 2020-04-01: 10 mg via ORAL
  Filled 2020-04-01: qty 2

## 2020-04-01 MED ORDER — ONDANSETRON HCL 4 MG/2ML IJ SOLN
4.0000 mg | Freq: Four times a day (QID) | INTRAMUSCULAR | Status: DC | PRN
  Administered 2020-04-01: 19:00:00 4 mg via INTRAVENOUS
  Filled 2020-04-01: qty 2

## 2020-04-01 MED ORDER — HEPARIN SODIUM (PORCINE) 1000 UNIT/ML IJ SOLN
INTRAMUSCULAR | Status: DC | PRN
  Administered 2020-04-01: 26000 [IU] via INTRAVENOUS
  Administered 2020-04-01: 5000 [IU] via INTRAVENOUS

## 2020-04-01 MED ORDER — MIDAZOLAM HCL 5 MG/5ML IJ SOLN
INTRAMUSCULAR | Status: DC | PRN
  Administered 2020-04-01: 4 mg via INTRAVENOUS
  Administered 2020-04-01 (×5): 2 mg via INTRAVENOUS

## 2020-04-01 MED ORDER — PLATELET RICH PLASMA OPTIME
Status: DC | PRN
  Administered 2020-04-01: 10:00:00 10 mL

## 2020-04-01 MED ORDER — SODIUM CHLORIDE 0.45 % IV SOLN: INTRAVENOUS | Status: DC | PRN

## 2020-04-01 MED ORDER — METHYLPREDNISOLONE SODIUM SUCC 125 MG IJ SOLR
INTRAMUSCULAR | Status: DC | PRN
  Administered 2020-04-01: 125 mg via INTRAVENOUS

## 2020-04-01 MED ORDER — METOPROLOL TARTRATE 25 MG/10 ML ORAL SUSPENSION: 12.5000 mg | Freq: Two times a day (BID) | ORAL | Status: DC

## 2020-04-01 MED ORDER — BUPIVACAINE LIPOSOME 1.3 % IJ SUSP
20.0000 mL | INTRAMUSCULAR | Status: AC
  Administered 2020-04-01: 10:00:00 20 mL
  Filled 2020-04-01: qty 20

## 2020-04-01 MED ORDER — BISACODYL 5 MG PO TBEC
10.0000 mg | DELAYED_RELEASE_TABLET | Freq: Every day | ORAL | Status: DC
  Administered 2020-04-02 – 2020-04-03 (×2): 10 mg via ORAL
  Filled 2020-04-01 (×2): qty 2

## 2020-04-01 MED ORDER — PLASMA-LYTE A IV SOLN: INTRAVENOUS | Status: DC

## 2020-04-01 MED ORDER — MAGNESIUM SULFATE 4 GM/100ML IV SOLN
4.0000 g | Freq: Once | INTRAVENOUS | Status: AC
  Administered 2020-04-01: 14:00:00 4 g via INTRAVENOUS
  Filled 2020-04-01: qty 100

## 2020-04-01 MED ORDER — 0.9 % SODIUM CHLORIDE (POUR BTL) OPTIME
TOPICAL | Status: DC | PRN
Start: 2020-04-01 — End: 2020-04-01
  Administered 2020-04-01: 10:00:00 5000 mL

## 2020-04-01 MED ORDER — CHLORHEXIDINE GLUCONATE CLOTH 2 % EX PADS
6.0000 | MEDICATED_PAD | Freq: Every day | CUTANEOUS | Status: DC
  Administered 2020-04-01 – 2020-04-04 (×4): 6 via TOPICAL

## 2020-04-01 MED ORDER — ACETAMINOPHEN 500 MG PO TABS
1000.0000 mg | ORAL_TABLET | Freq: Once | ORAL | Status: AC
  Administered 2020-04-01: 06:00:00 1000 mg via ORAL
  Filled 2020-04-01: qty 2

## 2020-04-01 MED ORDER — PLASMA-LYTE 148 IV SOLN
INTRAVENOUS | Status: DC | PRN
  Administered 2020-04-01: 09:00:00 500 mL via INTRAVASCULAR

## 2020-04-01 MED ORDER — MIDAZOLAM HCL 2 MG/2ML IJ SOLN
2.0000 mg | INTRAMUSCULAR | Status: DC | PRN
  Filled 2020-04-01: qty 2

## 2020-04-01 MED ORDER — CHLORHEXIDINE GLUCONATE 4 % EX LIQD: 30.0000 mL | CUTANEOUS | Status: DC

## 2020-04-01 MED ORDER — METOPROLOL TARTRATE 12.5 MG HALF TABLET
12.5000 mg | ORAL_TABLET | Freq: Once | ORAL | Status: DC
  Filled 2020-04-01: qty 1

## 2020-04-01 MED ORDER — INSULIN REGULAR(HUMAN) IN NACL 100-0.9 UT/100ML-% IV SOLN: INTRAVENOUS | Status: DC

## 2020-04-01 SURGICAL SUPPLY — 91 items
ADAPTER CARDIO PERF ANTE/RETRO (ADAPTER) ×3 IMPLANT
APPLICATOR TIP COSEAL (VASCULAR PRODUCTS) IMPLANT
BAG DECANTER FOR FLEXI CONT (MISCELLANEOUS) ×3 IMPLANT
BLADE CLIPPER SURG (BLADE) ×3 IMPLANT
BLADE STERNUM SYSTEM 6 (BLADE) ×3 IMPLANT
BLADE SURG 15 STRL LF DISP TIS (BLADE) ×2 IMPLANT
BLADE SURG 15 STRL SS (BLADE) ×1
CANISTER SUCT 3000ML PPV (MISCELLANEOUS) ×3 IMPLANT
CANNULA SUMP PERICARDIAL (CANNULA) ×3 IMPLANT
CATH CPB KIT HENDRICKSON (MISCELLANEOUS) ×3 IMPLANT
CATH HEART VENT LEFT (CATHETERS) ×2 IMPLANT
CATH RETROPLEGIA CORONARY 14FR (CATHETERS) ×3 IMPLANT
CATH ROBINSON RED A/P 18FR (CATHETERS) ×9 IMPLANT
CAUTERY HIGH TEMP VAS (MISCELLANEOUS) IMPLANT
CLIP VESOCCLUDE LG 6/CT (CLIP) ×3 IMPLANT
CNTNR URN SCR LID CUP LEK RST (MISCELLANEOUS) ×2 IMPLANT
CONN 3/8X3/8 GISH STERILE (MISCELLANEOUS) ×3 IMPLANT
CONT SPEC 4OZ STRL OR WHT (MISCELLANEOUS) ×1
DERMABOND ADVANCED (GAUZE/BANDAGES/DRESSINGS) ×1
DERMABOND ADVANCED .7 DNX12 (GAUZE/BANDAGES/DRESSINGS) ×2 IMPLANT
DRAIN CHANNEL 28F RND 3/8 FF (WOUND CARE) ×6 IMPLANT
DRAPE SLUSH/WARMER DISC (DRAPES) ×3 IMPLANT
DRSG AQUACEL AG ADV 3.5X 6 (GAUZE/BANDAGES/DRESSINGS) ×3 IMPLANT
DRSG AQUACEL AG ADV 3.5X14 (GAUZE/BANDAGES/DRESSINGS) ×3 IMPLANT
ELECT CAUTERY BLADE 6.4 (BLADE) ×3 IMPLANT
ELECT REM PT RETURN 9FT ADLT (ELECTROSURGICAL) ×6
ELECTRODE REM PT RTRN 9FT ADLT (ELECTROSURGICAL) ×4 IMPLANT
FELT TEFLON 1X6 (MISCELLANEOUS) ×3 IMPLANT
GAUZE SPONGE 4X4 12PLY STRL (GAUZE/BANDAGES/DRESSINGS) ×3 IMPLANT
GAUZE SPONGE 4X4 12PLY STRL LF (GAUZE/BANDAGES/DRESSINGS) ×3 IMPLANT
GLOVE BIO SURGEON STRL SZ 6 (GLOVE) IMPLANT
GLOVE BIO SURGEON STRL SZ 6.5 (GLOVE) ×6 IMPLANT
GLOVE BIO SURGEON STRL SZ7 (GLOVE) IMPLANT
GLOVE BIO SURGEON STRL SZ7.5 (GLOVE) IMPLANT
GLOVE BIOGEL PI IND STRL 7.5 (GLOVE) ×4 IMPLANT
GLOVE BIOGEL PI IND STRL 8.5 (GLOVE) ×2 IMPLANT
GLOVE BIOGEL PI INDICATOR 7.5 (GLOVE) ×2
GLOVE BIOGEL PI INDICATOR 8.5 (GLOVE) ×1
GLOVE NEODERM STRL 7.5 LF PF (GLOVE) ×6 IMPLANT
GLOVE SURG NEODERM 7.5  LF PF (GLOVE) ×3
GLOVE SURG SS PI 6.0 STRL IVOR (GLOVE) ×12 IMPLANT
GLOVE SURG SS PI 7.5 STRL IVOR (GLOVE) ×3 IMPLANT
GOWN STRL REUS W/ TWL LRG LVL3 (GOWN DISPOSABLE) ×14 IMPLANT
GOWN STRL REUS W/TWL LRG LVL3 (GOWN DISPOSABLE) ×7
GRAFT WOVEN D/V 30DX30L (Vascular Products) ×3 IMPLANT
HEMOSTAT POWDER SURGIFOAM 1G (HEMOSTASIS) IMPLANT
HEMOSTAT SURGICEL 2X14 (HEMOSTASIS) IMPLANT
INSERT FOGARTY XLG (MISCELLANEOUS) ×3 IMPLANT
INSERT SUTURE HOLDER (MISCELLANEOUS) ×3 IMPLANT
KIT APPLICATOR RATIO 11:1 (KITS) ×3 IMPLANT
KIT BASIN OR (CUSTOM PROCEDURE TRAY) ×3 IMPLANT
KIT SUCTION CATH 14FR (SUCTIONS) ×3 IMPLANT
KIT TURNOVER KIT B (KITS) ×3 IMPLANT
LINE VENT (MISCELLANEOUS) ×3 IMPLANT
NS IRRIG 1000ML POUR BTL (IV SOLUTION) ×15 IMPLANT
PACK E OPEN HEART (SUTURE) ×3 IMPLANT
PACK OPEN HEART (CUSTOM PROCEDURE TRAY) ×3 IMPLANT
PACK PLATELET PROCEDURE 60 (MISCELLANEOUS) ×3 IMPLANT
PAD ARMBOARD 7.5X6 YLW CONV (MISCELLANEOUS) ×6 IMPLANT
POSITIONER HEAD DONUT 9IN (MISCELLANEOUS) ×3 IMPLANT
POWDER SURGICEL 3.0 GRAM (HEMOSTASIS) ×6 IMPLANT
SEALANT SURG COSEAL 8ML (VASCULAR PRODUCTS) ×6 IMPLANT
SET CARDIOPLEGIA MPS 5001102 (MISCELLANEOUS) ×3 IMPLANT
SPONGE LAP 4X18 RFD (DISPOSABLE) ×3 IMPLANT
SUT BONE WAX W31G (SUTURE) ×3 IMPLANT
SUT ETHIBOND X763 2 0 SH 1 (SUTURE) ×3 IMPLANT
SUT MNCRL AB 3-0 PS2 18 (SUTURE) ×6 IMPLANT
SUT PDS AB 1 CTX 36 (SUTURE) ×6 IMPLANT
SUT PROLENE 3 0 SH 1 (SUTURE) ×3 IMPLANT
SUT PROLENE 3 0 SH DA (SUTURE) ×6 IMPLANT
SUT PROLENE 4 0 RB 1 (SUTURE) ×9
SUT PROLENE 4 0 SH DA (SUTURE) ×3 IMPLANT
SUT PROLENE 4-0 RB1 .5 CRCL 36 (SUTURE) ×18 IMPLANT
SUT PROLENE 5 0 C 1 36 (SUTURE) ×6 IMPLANT
SUT STEEL STERNAL CCS#1 18IN (SUTURE) ×3 IMPLANT
SUT STEEL SZ 6 DBL 3X14 BALL (SUTURE) ×6 IMPLANT
SUT VIC AB 2-0 CT1 27 (SUTURE) ×1
SUT VIC AB 2-0 CT1 TAPERPNT 27 (SUTURE) ×2 IMPLANT
SYR 10ML KIT SKIN ADHESIVE (MISCELLANEOUS) ×3 IMPLANT
SYSTEM SAHARA CHEST DRAIN ATS (WOUND CARE) ×3 IMPLANT
TAPE CLOTH SURG 4X10 WHT LF (GAUZE/BANDAGES/DRESSINGS) ×3 IMPLANT
TAPE PAPER 2X10 WHT MICROPORE (GAUZE/BANDAGES/DRESSINGS) ×3 IMPLANT
TIP DUAL SPRAY TOPICAL (TIP) ×9 IMPLANT
TOWEL GREEN STERILE (TOWEL DISPOSABLE) ×3 IMPLANT
TOWEL GREEN STERILE FF (TOWEL DISPOSABLE) IMPLANT
TRAY FOLEY SLVR 16FR TEMP STAT (SET/KITS/TRAYS/PACK) ×3 IMPLANT
TUBE SUCT INTRACARD DLP 20F (MISCELLANEOUS) ×3 IMPLANT
UNDERPAD 30X36 HEAVY ABSORB (UNDERPADS AND DIAPERS) ×3 IMPLANT
VENT LEFT HEART 12002 (CATHETERS) ×3
WATER STERILE IRR 1000ML POUR (IV SOLUTION) ×6 IMPLANT
YANKAUER SUCT BULB TIP NO VENT (SUCTIONS) ×3 IMPLANT

## 2020-04-01 NOTE — Anesthesia Procedure Notes (Signed)
Arterial Line Insertion Start/End12/16/2021 7:10 AM, 04/01/2020 7:15 AM Performed by: Amadeo Garnet, CRNA, CRNA  Patient location: Pre-op. Preanesthetic checklist: patient identified, IV checked, site marked, risks and benefits discussed, surgical consent, monitors and equipment checked, pre-op evaluation, timeout performed and anesthesia consent Lidocaine 1% used for infiltration and patient sedated Right, radial was placed Catheter size: 20 G Hand hygiene performed  and maximum sterile barriers used   Attempts: 1 Procedure performed without using ultrasound guided technique. Following insertion, Biopatch and dressing applied. Post procedure assessment: normal  Patient tolerated the procedure well with no immediate complications.

## 2020-04-01 NOTE — Transfer of Care (Signed)
Immediate Anesthesia Transfer of Care Note  Patient: COTY LARSH  Procedure(s) Performed: THORACIC ASCENDING ANEURYSM REPAIR (AAA) USING HEMASHIELD PLATINUM 30 MM VASCULAR SHIELD. (N/A Chest) TRANSESOPHAGEAL ECHOCARDIOGRAM (TEE) (N/A )  Patient Location: SICU  Anesthesia Type:General  Level of Consciousness: Patient remains intubated per anesthesia plan  Airway & Oxygen Therapy: Patient remains intubated per anesthesia plan and Patient placed on Ventilator (see vital sign flow sheet for setting)  Post-op Assessment: Report given to RN and Post -op Vital signs reviewed and stable  Post vital signs: Reviewed and stable  Last Vitals:  Vitals Value Taken Time  BP    Temp    Pulse    Resp    SpO2      Last Pain:  Vitals:   04/01/20 0617  TempSrc:   PainSc: 3       Patients Stated Pain Goal: 3 (50/93/26 7124)  Complications: No complications documented.

## 2020-04-01 NOTE — Anesthesia Procedure Notes (Signed)
Arterial Line Insertion Start/End12/16/2021 6:45 AM, 04/01/2020 6:50 AM Performed by: Amadeo Garnet, CRNA, CRNA  Patient location: Pre-op. Preanesthetic checklist: patient identified, IV checked, site marked, risks and benefits discussed, surgical consent, monitors and equipment checked, pre-op evaluation, timeout performed and anesthesia consent Lidocaine 1% used for infiltration and patient sedated Left, radial was placed Catheter size: 20 G Hand hygiene performed  and maximum sterile barriers used   Attempts: 1 Procedure performed without using ultrasound guided technique. Following insertion, dressing applied and Biopatch. Post procedure assessment: normal  Patient tolerated the procedure well with no immediate complications.

## 2020-04-01 NOTE — Hospital Course (Addendum)
History of Present Illness:  49 year old gentleman presents for assessment of ascending aortic aneurysm.  The patient has a history of ruptured colonic diverticulum several years ago while he was residing in [place].  He underwent urgent operation.  During his convalescence he had a CT scan which demonstrated an ascending aortic aneurysm measuring approximately 4.3 cm.  Patient moved here approximately a year and a half ago.  He has been followed with Dr. Percival Spanish for this aneurysm.  Looking back at the records this has been growing from the original diameter to approximately 5 cm now.  He has difficult to control high blood pressure and is on 4 different medications.  He endorses chest pain particularly with stress.  He underwent a left heart catheterization 3 years ago which was clean.  Patient has no known family history of aneurysm disease.  However, he does have a extended history of cardiovascular disease with early deaths.   Hospital Course:  Patient was admitted for elective surgery on April 01, 2020.  He was prepared and taken to the operating room where the ascending aortic aneurysm was repaired with a 30 mm Hemashield straight graft under circulatory arrest.  Following the procedure, he was rewarmed and separated from cardiopulmonary bypass without difficulty.  He was transfused with platelets and fresh frozen plasma in the operating room for next perioperative coagulopathy.  He was transferred to the cardiovascular ICU in stable condition.  He was weaned from the ventilator and extubated by 6pm on the day of surgery.   Vasoactive drips were weaned without difficulty and he maintained stable hemodynamics and cardiac rhythm.  He was mobilized on the first postoperative day and the PA catheter and arterial lines were removed.

## 2020-04-01 NOTE — Anesthesia Procedure Notes (Signed)
Procedure Name: Intubation Date/Time: 04/01/2020 7:43 AM Performed by: Amadeo Garnet, CRNA Pre-anesthesia Checklist: Patient identified, Emergency Drugs available, Suction available and Patient being monitored Patient Re-evaluated:Patient Re-evaluated prior to induction Oxygen Delivery Method: Circle system utilized Preoxygenation: Pre-oxygenation with 100% oxygen Induction Type: IV induction Ventilation: Mask ventilation without difficulty Laryngoscope Size: Mac and 4 Grade View: Grade I Tube type: Oral Tube size: 7.5 mm Number of attempts: 1 Airway Equipment and Method: Stylet and Oral airway Placement Confirmation: ETT inserted through vocal cords under direct vision,  positive ETCO2 and breath sounds checked- equal and bilateral Secured at: 23 cm Tube secured with: Tape Dental Injury: Teeth and Oropharynx as per pre-operative assessment

## 2020-04-01 NOTE — Progress Notes (Signed)
CT surgery p.m. rounds  Stable after resection and grafting of ascending aneurysm Extubated Sinus rhythm Neuro intact Chest tube output satisfactory Blood pressure 129/89, pulse 86, temperature 98.06 F (36.7 C), resp. rate 17, height 6\' 2"  (1.88 m), weight 97.5 kg, SpO2 95 %.

## 2020-04-01 NOTE — Anesthesia Procedure Notes (Signed)
Central Venous Catheter Insertion Performed by: Audry Pili, MD, anesthesiologist Start/End12/16/2021 7:00 AM, 04/01/2020 7:02 AM Patient location: Pre-op. Preanesthetic checklist: patient identified, IV checked, risks and benefits discussed, surgical consent, monitors and equipment checked, pre-op evaluation, timeout performed and anesthesia consent Position: Trendelenburg Hand hygiene performed  and maximum sterile barriers used  Total catheter length 10. PA cath was placed.Swan type:thermodilution PA Cath depth:50 Procedure performed without using ultrasound guided technique. Attempts: 1 Patient tolerated the procedure well with no immediate complications.

## 2020-04-01 NOTE — Procedures (Signed)
Extubation Procedure Note  Patient Details:   Name: TYUS KALLAM DOB: [birth year] MRN: [id number]   Airway Documentation:    Vent end date: 04/01/20 Vent end time: 1738   Evaluation  O2 sats: stable throughout Complications: No apparent complications Patient did tolerate procedure well. Bilateral Breath Sounds: Clear   Pt extubated to 4L [place] per rapid wean protocol. NIF was -40 and VC was 900. Pt had positive cuff leak prior to extubation. Pt able to voice his name and no stridor noted.  Vilinda Blanks 04/01/2020, 5:38 PM

## 2020-04-01 NOTE — Anesthesia Procedure Notes (Signed)
Central Venous Catheter Insertion Performed by: Audry Pili, MD, anesthesiologist Start/End12/16/2021 6:52 AM, 04/01/2020 7:02 AM Patient location: Pre-op. Preanesthetic checklist: patient identified, IV checked, risks and benefits discussed, surgical consent, monitors and equipment checked, pre-op evaluation, timeout performed and anesthesia consent Position: Trendelenburg Lidocaine 1% used for infiltration and patient sedated Hand hygiene performed , maximum sterile barriers used  and Seldinger technique used Catheter size: 8.5 Fr Central line was placed.Sheath introducer Procedure performed using ultrasound guided technique. Ultrasound Notes:anatomy identified, needle tip was noted to be adjacent to the nerve/plexus identified, no ultrasound evidence of intravascular and/or intraneural injection and image(s) printed for medical record Attempts: 1 Following insertion, line sutured, dressing applied and Biopatch. Post procedure assessment: blood return through all ports, free fluid flow and no air  Patient tolerated the procedure well with no immediate complications.

## 2020-04-01 NOTE — Anesthesia Postprocedure Evaluation (Signed)
Anesthesia Post Note  Patient: Clayton Hernandez  Procedure(s) Performed: THORACIC ASCENDING ANEURYSM REPAIR (AAA) USING HEMASHIELD PLATINUM 30 MM VASCULAR SHIELD. (N/A Chest) TRANSESOPHAGEAL ECHOCARDIOGRAM (TEE) (N/A )     Patient location during evaluation: SICU Anesthesia Type: General Level of consciousness: sedated Pain management: pain level controlled Vital Signs Assessment: post-procedure vital signs reviewed and stable Respiratory status: patient remains intubated per anesthesia plan Cardiovascular status: stable Postop Assessment: no apparent nausea or vomiting Anesthetic complications: no   No complications documented.  Last Vitals:  Vitals:   04/01/20 0609 04/01/20 1342  BP: 129/89   Pulse: 60 80  Resp: 17 15  Temp: 36.6 C   SpO2: 96% 98%    Last Pain:  Vitals:   04/01/20 0617  TempSrc:   PainSc: 3                  Rajon Bisig DANIEL

## 2020-04-01 NOTE — H&P (Signed)
History and Physical Interval Note:  04/01/2020 7:11 AM  Wray Kearns  has presented today for surgery, with the diagnosis of TAA.  The various methods of treatment have been discussed with the patient and family. After consideration of risks, benefits and other options for treatment, the patient has consented to  Procedure(s): THORACIC ASCENDING ANEURYSM REPAIR (AAA) (N/A) TRANSESOPHAGEAL ECHOCARDIOGRAM (TEE) (N/A) as a surgical intervention.  The patient's history has been reviewed, patient examined, no change in status, stable for surgery.  I have reviewed the patient's chart and labs.  Questions were answered to the patient's satisfaction.     [address]       [place] [address]             [phone]     CARDIOTHORACIC SURGERY CONSULTATION REPORT  Referring Provider is No ref. provider found Primary Cardiologist is [name], MD PCP is McGowen, Adrian Blackwater, MD  No chief complaint on file.   HPI:  49 year old gentleman presents for assessment of ascending aortic aneurysm.  The patient has a history of ruptured colonic diverticulum several years ago while he was residing in [place].  He underwent urgent operation.  During his convalescence he had a CT scan which demonstrated an ascending aortic aneurysm measuring approximately 4.3 cm.  Patient moved here approximately a year and a half ago.  He has been followed with Dr. Percival Spanish for this aneurysm.  Looking back at the records this has been growing from the original diameter to approximately 5 cm now.  He has difficult to control high blood pressure and is on 4 different medications.  He endorses chest pain particularly with stress.  He underwent a left heart catheterization 3 years ago which was clean.  Patient has no known family history of aneurysm disease.  However, he does have a extended history of cardiovascular disease with early deaths.  Past Medical History:   Diagnosis Date  . Ascending aortic aneurysm (HCC)    4.7 cm. 12/2018 imaging->stable. 02/2020 CT angio/aortoa slight progression->cardiol ref'd him to CT surg  . Cancer [facility])    colon cancers per doctor's notes  . Chronic renal insufficiency, stage 3 (moderate) (HCC)    GFR 55 ml/min  . Cirrhosis, nonalcoholic (HCC)    NAFLD  . Diverticulitis   . Essential hypertension   . Frequent headaches   . GERD (gastroesophageal reflux disease)   . History of colon cancer 2015   Surgery; but no chemo or rad was required.  Had a total of 5 surgeries, hx of colostomy, then an ileostomy.  [name] Hx of migraines   . Hyperlipemia, mixed    Dr. Percival Spanish d/c'd his fibrate and is considering switch/discontinuation of his statin (as of 05/2019)  . Insomnia   . Lateral epicondylitis    Recurrent, right: steroid injection by Dr. Junius Roads Sept and Nov 2020.  . Mild intermittent asthma   . Obesity, Class I, BMI 30-34.9   . Sleep apnea     Past Surgical History:  Procedure Laterality Date  . ABI's  03/2019   Normal  . CARDIAC CATHETERIZATION  03/30/2020   No CAD.  [name] COLON SURGERY  2015   2 cm colon ca excised.  . COLONOSCOPY  many   he has had colonoscopy q50mo since 2015.  Most recent was approx 04/2018->normal.  . COLOSTOMY  2015   diverticulitis   . ILEOSTOMY  2016  . RIGHT/LEFT HEART  CATH AND CORONARY ANGIOGRAPHY N/A 03/30/2020   Procedure: RIGHT/LEFT HEART CATH AND CORONARY ANGIOGRAPHY;  Surgeon: Jettie Booze, MD;  Location: [facility];  Service: Cardiovascular;  Laterality: N/A;  . THORACIC AORTOGRAM N/A 03/30/2020   Procedure: THORACIC AORTOGRAM;  Surgeon: Jettie Booze, MD;  Location: [facility];  Service: Cardiovascular;  Laterality: N/A;  . TRANSTHORACIC ECHOCARDIOGRAM  03/23/2020   49 mm ascend aort aneur.  EF 55-60%, grd II DD, valves normal.    Family History  Problem Relation Age of Onset  . Lymphoma Mother   . Cancer Father        ureter  . Stomach  cancer Father   . Heart disease Father 67       Stents  . High blood pressure Father   . High Cholesterol Father   . Thyroid cancer Sister     Social History   Socioeconomic History  . Marital status: Married    Spouse name: Not on file  . Number of children: Not on file  . Years of education: Not on file  . Highest education level: Not on file  Occupational History  . Not on file  Tobacco Use  . Smoking status: Former Smoker    Types: Cigarettes    Quit date: 12/31/1988    Years since quitting: 31.2  . Smokeless tobacco: Never Used  Substance and Sexual Activity  . Alcohol use: Yes    Comment: rarely  . Drug use: Not on file  . Sexual activity: Not on file  Other Topics Concern  . Not on file  Social History Narrative   Married, 2 daughters and 1 son.   Relocated from [place] to [place].  Born and raised in [place].  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   No T/A/Ds.   Social Determinants of Health   Financial Resource Strain: Not on file  Food Insecurity: Not on file  Transportation Needs: Not on file  Physical Activity: Not on file  Stress: Not on file  Social Connections: Not on file  Intimate Partner Violence: Not on file    Current Facility-Administered Medications  Medication Dose Route Frequency Provider Last Rate Last Admin  . chlorhexidine (HIBICLENS) 4 % liquid 2 application  30 mL Topical UD Lateka Rady, Glenice Bow, MD      . Derrill Memo ON 04/02/2020] chlorhexidine (PERIDEX) 0.12 % solution 15 mL  15 mL Mouth/Throat Once Jeray Shugart, Glenice Bow, MD      . dexmedetomidine (PRECEDEX) 400 MCG/100ML (4 mcg/mL) infusion  0.1-0.7 mcg/kg/hr Intravenous To OR Breiana Stratmann, Glenice Bow, MD      . EPINEPHrine (ADRENALIN) 4 mg in NS 250 mL (0.016 mg/mL) premix infusion  0-10 mcg/min Intravenous To OR Nollie Terlizzi Z, MD      . heparin 30,000 units/NS 1000 mL solution for CELLSAVER   Other To OR  Avira Tillison, Glenice Bow, MD      . heparin sodium (porcine) 2,500 Units, papaverine 30 mg in electrolyte-148 (PLASMALYTE-148) 500 mL irrigation   Irrigation To OR Jaliyah Fotheringham Z, MD      . insulin regular, human (MYXREDLIN) 100 units/ 100 mL infusion   Intravenous To OR Ruqaya Strauss, Glenice Bow, MD      . Burgess Amor Blood Cardioplegia vial (lidocaine/magnesium/mannitol 0.26g-4g-6.4g)   Intracoronary Once Wonda Olds, MD      . levofloxacin (  LEVAQUIN) IVPB 500 mg  500 mg Intravenous To OR Takila Kronberg, Glenice Bow, MD      . metoprolol tartrate (LOPRESSOR) tablet 12.5 mg  12.5 mg Oral Once Ramsha Lonigro, Glenice Bow, MD      . milrinone (PRIMACOR) 20 MG/100 ML (0.2 mg/mL) infusion  0.3 mcg/kg/min Intravenous To OR Fain Francis Z, MD      . nitroGLYCERIN 50 mg in dextrose 5 % 250 mL (0.2 mg/mL) infusion  2-200 mcg/min Intravenous To OR Angelissa Supan, Glenice Bow, MD      . norepinephrine (LEVOPHED) 4mg  in 264mL premix infusion  0-40 mcg/min Intravenous To OR Dahmir Epperly Z, MD      . phenylephrine (NEOSYNEPHRINE) 20-0.9 MG/250ML-% infusion  30-200 mcg/min Intravenous To OR Jonie Burdell Z, MD      . potassium chloride injection 80 mEq  80 mEq Other To OR Boaz Berisha, Glenice Bow, MD      . tranexamic acid (CYKLOKAPRON) 2,500 mg in sodium chloride 0.9 % 250 mL (10 mg/mL) infusion  1.5 mg/kg/hr Intravenous To OR Shanique Aslinger, Glenice Bow, MD      . tranexamic acid (CYKLOKAPRON) bolus via infusion - over 30 minutes 1,497 mg  15 mg/kg Intravenous To OR Louna Rothgeb Z, MD      . tranexamic acid (CYKLOKAPRON) pump prime solution 200 mg  2 mg/kg Intracatheter To OR Fabienne Nolasco Z, MD      . vancomycin (VANCOREADY) IVPB 1500 mg/300 mL  1,500 mg Intravenous To OR Lorne Winkels, Glenice Bow, MD        Allergies  Allergen Reactions  . Morphine And Related Other (See Comments)    "all opiates" - "they'll kill me"  . Penicillins Anaphylaxis  . Toradol [Ketorolac Tromethamine] Other (See Comments)    Unknown reaction (possible rash)       Review of Systems:   General:  Reduced energy level and no change in weight  Cardiac:  Does experience chest pain and palpitations  Respiratory:  Endorses dyspnea on exertion  GI:   History of ruptured diverticulum with Hartmann's procedure and then reconnection.  GU:   Denies kidney disease  Vascular:  Denies vascular disease  Neuro:   No history of stroke or TIAs  Musculoskeletal: History of multiple skeletal injuries  Skin:   Negative  Psych:   Positive unusual recent stress  Eyes:   Negative  ENT:   Negative  Hematologic:  Negative  Endocrine:  No diabetes, no thyroid disorders     Physical Exam:   BP 129/89   Pulse 60   Temp 97.9 F (36.6 C) (Oral)   Resp 17   Ht 6\' 2"  (1.88 m)   Wt 97.5 kg   SpO2 96%   BMI 27.60 kg/m   General:  well-appearing  HEENT:  Unremarkable   Neck:   no JVD, no bruits, no adenopathy  Chest:   clear to auscultation, symmetrical breath sounds, no wheezes, no rhonchi   CV:   RRR, no detectable murmur   Abdomen:  Multiple well-healed incisions  Extremities:  warm, well-perfused, pulses intact throughout, no LE edema  Rectal/GU  Deferred  Neuro:   Grossly non-focal and symmetrical throughout  Skin:   Clean and dry, no rashes, no breakdown   Diagnostic Tests:  I personally reviewed his available imaging studies and agree with their interpretation; the maximal diameter of the ascending aorta is approximately 5 cm which has grown from 4.2 to 4.3 cm   Impression:  49 year old gentleman with a recognized ascending aortic aneurysm  which is growing in diameter over the last 2 to 3 years.  In addition to this he has high blood pressure and some evidence of connective tissue disorder.  I would suggest elective intervention with replacement of the ascending aorta in short order   Plan:  Left heart catheterization for preoperative clearance and to assess chest pain Transthoracic echocardiogram to assess LV function and aortic valve  function The patient will call for an operative date in the near future   I spent in excess of 40 minutes during the conduct of this office consultation and >50% of this time involved direct face-to-face encounter with the patient for counseling and/or coordination of their care.          Level 3 Office Consult = 40 minutes         Level 4 Office Consult = 60 minutes         Level 5 Office Consult = 80 minutes  B.  Murvin Natal, MD 04/01/2020 7:11 AM

## 2020-04-01 NOTE — Brief Op Note (Addendum)
04/01/2020  10:56 AM  PATIENT:  Clayton Hernandez  49 y.o. male  PRE-OPERATIVE DIAGNOSIS:  Ascending aortic aneurysm  POST-OPERATIVE DIAGNOSIS:  Ascending aortic aneurysm  PROCEDURE:    THORACIC ASCENDING ANEURYSM REPAIR with a 9mm HEMASHIELD GRAFT USING CIRCULATORY ARREST   TRANSESOPHAGEAL ECHOCARDIOGRAM  SURGEON:  Wonda Olds, MD   PHYSICIAN ASSISTANT: Roddenberry  ANESTHESIA:   general  EBL:  Per perfusion and anesthesia records   BLOOD ADMINISTERED: FFP, PLT's  DRAINS: Mediastinal Blake drains   LOCAL MEDICATIONS USED:  NONE  SPECIMEN:  Source of Specimen:  Aneurysmal ascending aorta  DISPOSITION OF SPECIMEN:  PATHOLOGY  COUNTS:  YES  DICTATION: .Dragon Dictation  PLAN OF CARE: Admit to inpatient   PATIENT DISPOSITION:  ICU - intubated and hemodynamically stable.   Delay start of Pharmacological VTE agent (>24hrs) due to surgical blood loss or risk of bleeding: yes Agree with documentation. Rigoberto Repass Z. [name], [place]

## 2020-04-01 NOTE — Progress Notes (Signed)
  Echocardiogram Echocardiogram Transesophageal has been performed.  Clayton Hernandez 04/01/2020, 8:37 AM

## 2020-04-02 ENCOUNTER — Encounter (HOSPITAL_COMMUNITY): Payer: Self-pay | Admitting: Cardiothoracic Surgery

## 2020-04-02 ENCOUNTER — Inpatient Hospital Stay (HOSPITAL_COMMUNITY): Payer: BC Managed Care – PPO

## 2020-04-02 LAB — BASIC METABOLIC PANEL
Anion gap: 9 (ref 5–15)
Anion gap: 13 (ref 5–15)
BUN: 15 mg/dL (ref 6–20)
BUN: 18 mg/dL (ref 6–20)
CO2: 21 mmol/L — ABNORMAL LOW (ref 22–32)
CO2: 24 mmol/L (ref 22–32)
Calcium: 7.8 mg/dL — ABNORMAL LOW (ref 8.9–10.3)
Calcium: 8.4 mg/dL — ABNORMAL LOW (ref 8.9–10.3)
Chloride: 107 mmol/L (ref 98–111)
Chloride: 101 mmol/L (ref 98–111)
Creatinine, Ser: 1.01 mg/dL (ref 0.61–1.24)
Creatinine, Ser: 1.17 mg/dL (ref 0.61–1.24)
GFR, Estimated: >60 mL/min (ref >60)
GFR, Estimated: >60 mL/min (ref >60)
Glucose, Bld: 149 mg/dL — ABNORMAL HIGH (ref 70–99)
Glucose, Bld: 140 mg/dL — ABNORMAL HIGH (ref 70–99)
Potassium: 4.3 mmol/L (ref 3.5–5.1)
Potassium: 4.1 mmol/L (ref 3.5–5.1)
Sodium: 137 mmol/L (ref 135–145)
Sodium: 138 mmol/L (ref 135–145)

## 2020-04-02 LAB — GLUCOSE, CAPILLARY
Glucose-Capillary: 100 mg/dL — ABNORMAL HIGH (ref 70–99)
Glucose-Capillary: 108 mg/dL — ABNORMAL HIGH (ref 70–99)
Glucose-Capillary: 148 mg/dL — ABNORMAL HIGH (ref 70–99)
Glucose-Capillary: 121 mg/dL — ABNORMAL HIGH (ref 70–99)
Glucose-Capillary: 144 mg/dL — ABNORMAL HIGH (ref 70–99)
Glucose-Capillary: 154 mg/dL — ABNORMAL HIGH (ref 70–99)
Glucose-Capillary: 164 mg/dL — ABNORMAL HIGH (ref 70–99)
Glucose-Capillary: 131 mg/dL — ABNORMAL HIGH (ref 70–99)
Glucose-Capillary: 161 mg/dL — ABNORMAL HIGH (ref 70–99)

## 2020-04-02 LAB — CBC
HCT: 37.7 % — ABNORMAL LOW (ref 39.0–52.0)
HCT: 40.1 % (ref 39.0–52.0)
Hemoglobin: 13.8 g/dL (ref 13.0–17.0)
Hemoglobin: 13.9 g/dL (ref 13.0–17.0)
MCH: 31.1 pg (ref 26.0–34.0)
MCH: 30.3 pg (ref 26.0–34.0)
MCHC: 36.6 g/dL — ABNORMAL HIGH (ref 30.0–36.0)
MCHC: 34.7 g/dL (ref 30.0–36.0)
MCV: 84.9 fL (ref 80.0–100.0)
MCV: 87.4 fL (ref 80.0–100.0)
Platelets: 106 10*3/uL — ABNORMAL LOW (ref 150–400)
Platelets: 105 10*3/uL — ABNORMAL LOW (ref 150–400)
RBC: 4.44 MIL/uL (ref 4.22–5.81)
RBC: 4.59 MIL/uL (ref 4.22–5.81)
RDW: 12.9 % (ref 11.5–15.5)
RDW: 13.3 % (ref 11.5–15.5)
WBC: 13.6 10*3/uL — ABNORMAL HIGH (ref 4.0–10.5)
WBC: 13.2 10*3/uL — ABNORMAL HIGH (ref 4.0–10.5)
nRBC: 0 % (ref 0.0–0.2)
nRBC: 0 % (ref 0.0–0.2)

## 2020-04-02 LAB — BPAM CRYOPRECIPITATE
Blood Product Expiration Date: 202112161759
ISSUE DATE / TIME: 202112161233
Unit Type and Rh: 5100

## 2020-04-02 LAB — PREPARE PLATELET PHERESIS: Unit division: 0

## 2020-04-02 LAB — PREPARE CRYOPRECIPITATE: Unit division: 0

## 2020-04-02 LAB — BPAM PLATELET PHERESIS
Blood Product Expiration Date: 202112162359
ISSUE DATE / TIME: 202112161156
Unit Type and Rh: 5100

## 2020-04-02 LAB — SURGICAL PATHOLOGY

## 2020-04-02 LAB — MAGNESIUM
Magnesium: 2 mg/dL (ref 1.7–2.4)
Magnesium: 1.8 mg/dL (ref 1.7–2.4)

## 2020-04-02 MED ORDER — DIPHENHYDRAMINE HCL 12.5 MG/5ML PO ELIX: 12.5000 mg | ORAL_SOLUTION | Freq: Four times a day (QID) | ORAL | Status: DC | PRN

## 2020-04-02 MED ORDER — INSULIN ASPART 100 UNIT/ML ~~LOC~~ SOLN: 0.0000 [IU] | Freq: Every day | SUBCUTANEOUS | Status: DC

## 2020-04-02 MED ORDER — THIAMINE HCL 100 MG/ML IJ SOLN
Freq: Once | INTRAVENOUS | Status: AC
  Filled 2020-04-02: qty 1000

## 2020-04-02 MED ORDER — INSULIN ASPART 100 UNIT/ML ~~LOC~~ SOLN
0.0000 [IU] | Freq: Three times a day (TID) | SUBCUTANEOUS | Status: DC
  Administered 2020-04-02: 13:00:00 3 [IU] via SUBCUTANEOUS
  Administered 2020-04-02: 17:00:00 2 [IU] via SUBCUTANEOUS
  Administered 2020-04-03 (×2): 3 [IU] via SUBCUTANEOUS

## 2020-04-02 MED ORDER — PROMETHAZINE HCL 25 MG/ML IJ SOLN
6.2500 mg | Freq: Four times a day (QID) | INTRAMUSCULAR | Status: DC | PRN
  Administered 2020-04-02 – 2020-04-03 (×3): 6.25 mg via INTRAVENOUS
  Filled 2020-04-02 (×3): qty 1

## 2020-04-02 MED ORDER — SODIUM CHLORIDE 0.9% FLUSH: 9.0000 mL | INTRAVENOUS | Status: DC | PRN

## 2020-04-02 MED ORDER — NALOXONE HCL 0.4 MG/ML IJ SOLN: 0.4000 mg | INTRAMUSCULAR | Status: DC | PRN

## 2020-04-02 MED ORDER — FENTANYL 50 MCG/ML IV PCA SOLN
INTRAVENOUS | Status: DC
  Administered 2020-04-02: 15 ug via INTRAVENOUS
  Administered 2020-04-02: 165 ug via INTRAVENOUS
  Administered 2020-04-03: 225 ug via INTRAVENOUS
  Administered 2020-04-03: 150 ug via INTRAVENOUS
  Filled 2020-04-02 (×2): qty 20

## 2020-04-02 MED ORDER — FUROSEMIDE 10 MG/ML IJ SOLN
40.0000 mg | Freq: Two times a day (BID) | INTRAMUSCULAR | Status: DC
  Administered 2020-04-02 – 2020-04-04 (×5): 40 mg via INTRAVENOUS
  Filled 2020-04-02 (×5): qty 4

## 2020-04-02 MED ORDER — DIPHENHYDRAMINE HCL 50 MG/ML IJ SOLN
12.5000 mg | Freq: Four times a day (QID) | INTRAMUSCULAR | Status: DC | PRN
  Administered 2020-04-02 – 2020-04-03 (×2): 12.5 mg via INTRAVENOUS
  Filled 2020-04-02 (×2): qty 1

## 2020-04-02 MED ORDER — DIAZEPAM 2 MG PO TABS
2.0000 mg | ORAL_TABLET | Freq: Three times a day (TID) | ORAL | Status: DC
  Administered 2020-04-02 – 2020-04-03 (×3): 2 mg via ORAL
  Filled 2020-04-02 (×3): qty 1

## 2020-04-02 MED ORDER — ONDANSETRON HCL 4 MG/2ML IJ SOLN: 4.0000 mg | Freq: Four times a day (QID) | INTRAMUSCULAR | Status: DC | PRN

## 2020-04-02 MED FILL — Electrolyte-R (PH 7.4) Solution: INTRAVENOUS | Qty: 6000 | Status: AC

## 2020-04-02 MED FILL — Sodium Chloride IV Soln 0.9%: INTRAVENOUS | Qty: 3000 | Status: AC

## 2020-04-02 MED FILL — Calcium Chloride Inj 10%: INTRAVENOUS | Qty: 10 | Status: AC

## 2020-04-02 MED FILL — Heparin Sodium (Porcine) Inj 1000 Unit/ML: INTRAMUSCULAR | Qty: 30 | Status: AC

## 2020-04-02 MED FILL — Potassium Chloride Inj 2 mEq/ML: INTRAVENOUS | Qty: 40 | Status: AC

## 2020-04-02 MED FILL — Lidocaine HCl Local Preservative Free (PF) Inj 2%: INTRAMUSCULAR | Qty: 15 | Status: AC

## 2020-04-02 MED FILL — Heparin Sodium (Porcine) Inj 1000 Unit/ML: INTRAMUSCULAR | Qty: 10 | Status: AC

## 2020-04-02 MED FILL — Vancomycin HCl IV Soln 1500 MG/300ML (Base Equivalent): INTRAVENOUS | Qty: 300 | Status: AC

## 2020-04-02 MED FILL — Mannitol IV Soln 20%: INTRAVENOUS | Qty: 500 | Status: AC

## 2020-04-02 MED FILL — Sodium Bicarbonate IV Soln 8.4%: INTRAVENOUS | Qty: 100 | Status: AC

## 2020-04-02 NOTE — Progress Notes (Signed)
1 Day Post-Op Procedure(s) (LRB): THORACIC ASCENDING ANEURYSM REPAIR (AAA) USING HEMASHIELD PLATINUM 30 MM VASCULAR SHIELD. (N/A) TRANSESOPHAGEAL ECHOCARDIOGRAM (TEE) (N/A) Subjective: No specific complaints  Objective: Vital signs in last 24 hours: Temp:  [95.72 F (35.4 C)-99.68 F (37.6 C)] 99.14 F (37.3 C) (12/17 0700) Pulse Rate:  [80-86] 86 (12/16 1736) Resp:  [13-28] 23 (12/17 0700) SpO2:  [92 %-100 %] 93 % (12/17 0700) Arterial Line BP: (87-140)/(49-90) 127/69 (12/17 0700) FiO2 (%):  [40 %-50 %] 40 % (12/16 1653) Weight:  [104.9 kg] 104.9 kg (12/17 0500)  Hemodynamic parameters for last 24 hours: PAP: (18-34)/(5-17) 21/10 CO:  [2.6 L/min-9 L/min] 8 L/min CI:  [1.2 L/min/m2-4 L/min/m2] 3.6 L/min/m2  Intake/Output from previous day: 12/16 0701 - 12/17 0700 In: 9241.8 [I.V.:5123.7; Blood:2597; IV Piggyback:1446.1] Out: 2197 [Urine:3030; Blood:2913; Chest Tube:580] Intake/Output this shift: No intake/output data recorded.  General appearance: alert and cooperative Neurologic: intact Heart: regular rate and rhythm, S1, S2 normal, no murmur, click, rub or gallop Lungs: clear to auscultation bilaterally Abdomen: soft, non-tender; bowel sounds normal; no masses,  no organomegaly Extremities: extremities normal, atraumatic, no cyanosis or edema Wound: dressed, dry  Lab Results: Recent Labs    04/01/20 1859 04/02/20 0405  WBC 16.3* 13.6*  HGB 15.7 13.8  HCT 45.0 37.7*  PLT 114* 106*   BMET:  Recent Labs    04/01/20 1859 04/02/20 0405  NA 138 137  K 4.4 4.3  CL 107 107  CO2 21* 21*  GLUCOSE 182* 149*  BUN 15 15  CREATININE 1.03 1.01  CALCIUM 7.7* 7.8*    PT/INR:  Recent Labs    04/01/20 1354  LABPROT 17.3*  INR 1.5*   ABG    Component Value Date/Time   PHART 7.330 (L) 04/01/2020 1853   HCO3 22.5 04/01/2020 1853   TCO2 24 04/01/2020 1853   ACIDBASEDEF 3.0 (H) 04/01/2020 1853   O2SAT 94.0 04/01/2020 1853   CBG (last 3)  Recent Labs     04/02/20 0541 04/02/20 0646 04/02/20 0814  GLUCAP 121* 144* 154*    Assessment/Plan: S/P Procedure(s) (LRB): THORACIC ASCENDING ANEURYSM REPAIR (AAA) USING HEMASHIELD PLATINUM 30 MM VASCULAR SHIELD. (N/A) TRANSESOPHAGEAL ECHOCARDIOGRAM (TEE) (N/A) Mobilize Diuresis d/c pa cath  D/c arterial lines    LOS: 1 day    Wonda Olds 04/02/2020

## 2020-04-02 NOTE — Progress Notes (Signed)
EVENING ROUNDS NOTE :     [address]       [place] [address]             [phone]                 1 Day Post-Op Procedure(s) (LRB): THORACIC ASCENDING ANEURYSM REPAIR (AAA) USING HEMASHIELD PLATINUM 30 MM VASCULAR SHIELD. (N/A) TRANSESOPHAGEAL ECHOCARDIOGRAM (TEE) (N/A)  Total Length of Stay:  LOS: 1 day  BP (!) 165/82   Pulse 92   Temp (!) 97.5 F (36.4 C) (Oral)   Resp 19   Ht 6\' 2"  (1.88 m)   Wt 104.9 kg   SpO2 93%   BMI 29.69 kg/m   .Intake/Output      12/16 0701 12/17 0700 12/17 0701 12/18 0700   I.V. (mL/kg) 5123.7 (48.8) 335.7 (3.2)   Blood 2597    Other 75    IV Piggyback 1446.1    Total Intake(mL/kg) 9241.8 (88.1) 335.7 (3.2)   Urine (mL/kg/hr) 3030 (1.2) 1100 (1.1)   Blood 2913    Chest Tube 590 120   Total Output 6533 1220   Net +2708.8 -884.3          . sodium chloride Stopped (04/02/20 0908)  . sodium chloride    . sodium chloride 10 mL/hr at 04/01/20 1353  . acetaminophen Stopped (04/02/20 0541)  . lactated ringers    . lactated ringers Stopped (04/01/20 1524)  . lactated ringers Stopped (04/01/20 1603)  . levofloxacin (LEVAQUIN) IV       Lab Results  Component Value Date   WBC 13.6 (H) 04/02/2020   HGB 13.8 04/02/2020   HCT 37.7 (L) 04/02/2020   PLT 106 (L) 04/02/2020   GLUCOSE 149 (H) 04/02/2020   CHOL 137 01/30/2019   TRIG 156.0 (H) 01/30/2019   HDL 39.00 (L) 01/30/2019   LDLCALC 67 01/30/2019   ALT 37 03/31/2020   AST 37 03/31/2020   NA 137 04/02/2020   K 4.3 04/02/2020   CL 107 04/02/2020   CREATININE 1.01 04/02/2020   BUN 15 04/02/2020   CO2 21 (L) 04/02/2020   INR 1.5 (H) 04/01/2020   HGBA1C 5.6 03/31/2020   Stable day On pca pump    Grace Isaac MD  Beeper [phone] Office [phone] 04/02/2020 4:10 PM

## 2020-04-02 NOTE — Op Note (Signed)
Procedure(s): THORACIC ASCENDING ANEURYSM REPAIR (AAA) USING HEMASHIELD PLATINUM 30 MM VASCULAR SHIELD. TRANSESOPHAGEAL ECHOCARDIOGRAM (TEE) Procedure Note  Clayton Hernandez male 49 y.o. 04/01/20 Procedure(s) and Anesthesia Type:    * THORACIC ASCENDING ANEURYSM REPAIR (AAA) USING HEMASHIELD PLATINUM 30 MM VASCULAR SHIELD. - General    * TRANSESOPHAGEAL ECHOCARDIOGRAM (TEE) - General  Surgeon(s) and Role:    * Wonda Olds, MD - Primary   Indications: The patient was admitted to the hospital with a history of ascending aortic aneurysm which has enlarged over the last couple of years on serial radiographic imaging.  In addition, he has difficult to control hypertension.  He now presents for repair of ascending aortic aneurysm having been thoroughly evaluated to be a good operative candidate for this elective procedure. Surgeon: Wonda Olds   Assistants: Macarthur Critchley, PA-C  Anesthesia: General endotracheal anesthesia  ASA Class: 3    Procedure Detail  THORACIC ASCENDING ANEURYSM REPAIR (AAA) USING HEMASHIELD PLATINUM 30 MM VASCULAR SHIELD., TRANSESOPHAGEAL ECHOCARDIOGRAM (TEE) After informed consent, the patient was taken the operating room on the above listed date.  Is placed in the supine position on the operating room table.  Anesthesia was confirmed with a general endotracheal technique.  After confirming anesthesia, the anterior chest neck abdomen groins and bilateral lower extremities were cleansed and draped in a sterile vision Betadine solution.  A preop surgical pause was performed.  Attention was first turned to the right deltopectoral groove where an incision was made overlying the groove.  Dissection was carried down to the subcutaneous tissue and the pectoralis major minor muscles were divided.  The right axillary artery was encountered and encircled.  5000 units of heparin were given intravenously.  A 10 mm dacryon graft was sewn end-to-side to the artery  with a running suture of 5-0 Prolene.  This was then de-aired and attached to the arterial limb of the cardioplegic bypass instrument.  Median sternotomy was undertaken.  A chest retractor was placed between the sternal halves.  The pericardium was opened and pericardial pericardial tacking sutures were placed to create a pericardial well.  The aorta was dilated in appearance.  Full dose heparin was then given.  The right atrium was cannulated.  After confirming adequate heparinization the patient is placed on cardiopulmonary bypass and immediate cooling was performed.  The goal temperature was 25 to 26 degrees.  Once the patient became bradycardic the aorta was crossclamped and antegrade cardioplegia was given with a good arrest achieved after approximately 200 cc of cardioplegia.  When this was completed the ascending aorta was transected and excised down to the level of the sinotubular junction.  Aortic valve sizers measured the annulus at 25 mm.  Therefore, a 30 mm dacryon graft was brought into the field and sewn into into the residual portion of the native aorta at the level of the sinotubular junction with a running suture of 3-0 Prolene.  When this completed approximately 45 minutes of cooling had been accomplished and the goal temperature had been achieved.  Circulatory arrest was undertaken.  The base of the innominate artery and the right carotid artery were clamped allowing perfusion through the right axillary cannula of the right internal carotid artery as an antegrade cerebral fashion.  Cerebral saturations were monitored and were stable.  The same 30 mm dacryon graft was beveled to fit the underside of the arch after it had been transected.  This was performed with a running suture of 3-0 Prolene.  When this  completed the graft was de-aired and flow to the brain and the body was reestablished.  The circulatory arrest time was 12 minutes.  Graft to graft anastomosis was then done with a running  suture of 4-0 Prolene.  A hotshot dose of cardioplegia was given down the aortic root and de-airing procedures were performed.  The aortic cross-clamp was removed.  Aggressive rewarming was undertaken.  After the patient was fully warmed pacing wires were inserted in the right ventricle and right atrium and exited inferiorly.  The patient was weaned from cardiopulmonary bypass without difficulty and the heart was decannulated.  Several blood products were needed in order to reverse coagulopathy.  Once the bleeding was controlled, the chest was closed over drains placed in the mediastinum.  The tissue overlying the sternum was then copiously irrigated and repaired in layers.  Sterile dressings were applied.  All sponge instrument and needle counts were correct and I was present participated in all aspects of procedure.  Estimated Blood Loss:  Per anesthesia record         Drains: 2 chest tubes in the mediastinum   Blood Given: Per anesthesia record         Specimens: Ascending aorta         Implants: none        Complications:  * No complications entered in OR log *         Disposition: ICU - intubated and critically ill.         Condition: stable

## 2020-04-03 ENCOUNTER — Inpatient Hospital Stay (HOSPITAL_COMMUNITY): Payer: BC Managed Care – PPO

## 2020-04-03 LAB — BASIC METABOLIC PANEL
Anion gap: 9 (ref 5–15)
BUN: 24 mg/dL — ABNORMAL HIGH (ref 6–20)
CO2: 25 mmol/L (ref 22–32)
Calcium: 8.5 mg/dL — ABNORMAL LOW (ref 8.9–10.3)
Chloride: 102 mmol/L (ref 98–111)
Creatinine, Ser: 1.25 mg/dL — ABNORMAL HIGH (ref 0.61–1.24)
GFR, Estimated: >60 mL/min (ref >60)
Glucose, Bld: 166 mg/dL — ABNORMAL HIGH (ref 70–99)
Potassium: 4.4 mmol/L (ref 3.5–5.1)
Sodium: 136 mmol/L (ref 135–145)

## 2020-04-03 LAB — GLUCOSE, CAPILLARY
Glucose-Capillary: 177 mg/dL — ABNORMAL HIGH (ref 70–99)
Glucose-Capillary: 119 mg/dL — ABNORMAL HIGH (ref 70–99)
Glucose-Capillary: 155 mg/dL — ABNORMAL HIGH (ref 70–99)
Glucose-Capillary: 131 mg/dL — ABNORMAL HIGH (ref 70–99)

## 2020-04-03 LAB — CBC
HCT: 38.6 % — ABNORMAL LOW (ref 39.0–52.0)
Hemoglobin: 13.1 g/dL (ref 13.0–17.0)
MCH: 29.8 pg (ref 26.0–34.0)
MCHC: 33.9 g/dL (ref 30.0–36.0)
MCV: 87.9 fL (ref 80.0–100.0)
Platelets: 113 10*3/uL — ABNORMAL LOW (ref 150–400)
RBC: 4.39 MIL/uL (ref 4.22–5.81)
RDW: 13.4 % (ref 11.5–15.5)
WBC: 15.8 10*3/uL — ABNORMAL HIGH (ref 4.0–10.5)
nRBC: 0 % (ref 0.0–0.2)

## 2020-04-03 MED ORDER — ALPRAZOLAM 0.25 MG PO TABS
0.2500 mg | ORAL_TABLET | Freq: Three times a day (TID) | ORAL | Status: DC | PRN
  Administered 2020-04-03 – 2020-04-08 (×13): 0.25 mg via ORAL
  Filled 2020-04-03 (×13): qty 1

## 2020-04-03 NOTE — Evaluation (Signed)
Physical Therapy Evaluation Patient Details Name: Clayton Hernandez MRN: [id number] DOB: 06-Dec-1970 Today's Date: 04/03/2020   History of Present Illness  49 y.o. male presenting to hospital for assessment of thoracic AAA. Pt underwent thoracic AAA repair on 04/01/2020, extubated same day. PMH includes cancer, cirrhosis, diverticulits, HTN, migraines, insomnia, sleep apnea.  Clinical Impression  Pt presents to PT with deficits in strength, power, gait, balance, functional mobility, endurance, and knowledge of sternal precautions. Pt continues to require cues for use of heart pillow to maintain sternal precautions and PT provides further education on UE ROM limitation to maintain sternal wound integrity. Pt requires some physical assistance to power up into standing at this time. Pt will continue to benefit from aggressive mobilization and PT POC to improve activity tolerance, mobility quality, and to progress gait and stair training. PT recommends discharge home with HHPT and a RW after further acute therapies.    Follow Up Recommendations Home health PT;Supervision for mobility/OOB    Equipment Recommendations  Rolling walker with 5" wheels    Recommendations for Other Services       Precautions / Restrictions Precautions Precautions: Fall;Sternal Precaution Booklet Issued: No Precaution Comments: PT verbally reviews sternal precautions with pt Restrictions Weight Bearing Restrictions: No Other Position/Activity Restrictions: sternal precautions      Mobility  Bed Mobility Overal bed mobility: Needs Assistance Bed Mobility: Rolling;Sit to Sidelying Rolling: Min guard       Sit to sidelying: Min guard      Transfers Overall transfer level: Needs assistance   Transfers: Sit to/from Stand Sit to Stand: Min assist;+2 physical assistance         General transfer comment: use of heart pillow, cues for sternal precautions  Ambulation/Gait Ambulation/Gait assistance:  Supervision Gait Distance (Feet): 350 Feet Assistive device:  (EVA walker) Gait Pattern/deviations: Step-to pattern Gait velocity: reduced Gait velocity interpretation: <1.8 ft/sec, indicate of risk for recurrent falls General Gait Details: pt with slowed step-to gait, step length slightly limited by EVA walker. no significant balance deviations noted  Stairs            Wheelchair Mobility    Modified Rankin (Stroke Patients Only)       Balance Overall balance assessment: Needs assistance Sitting-balance support: No upper extremity supported;Feet supported Sitting balance-Leahy Scale: Fair     Standing balance support: Bilateral upper extremity supported Standing balance-Leahy Scale: Poor Standing balance comment: reliant on UE support of RW                             Pertinent Vitals/Pain Pain Assessment: 0-10 Pain Score: 6  Pain Location: incision site Pain Descriptors / Indicators: Sore Pain Intervention(s): PCA encouraged    Home Living Family/patient expects to be discharged to:: Private residence Living Arrangements: Spouse/significant other;Children Available Help at Discharge: Family;Available 24 hours/day Type of Home: House Home Access: Stairs to enter Entrance Stairs-Rails: Right Entrance Stairs-Number of Steps: 1.5 per pt Home Layout: Two level;Able to live on main level with bedroom/bathroom Home Equipment: Cane - single point      Prior Function Level of Independence: Independent               Hand Dominance        Extremity/Trunk Assessment   Upper Extremity Assessment Upper Extremity Assessment: Generalized weakness (strength not formally assessed 2/2 sternal precautions)    Lower Extremity Assessment Lower Extremity Assessment: Generalized weakness    Cervical / Trunk Assessment  Cervical / Trunk Assessment: Other exceptions Cervical / Trunk Exceptions: sternal dressing clean, dry, intact  Communication    Communication: No difficulties  Cognition Arousal/Alertness: Awake/alert Behavior During Therapy: WFL for tasks assessed/performed Overall Cognitive Status: Within Functional Limits for tasks assessed                                        General Comments General comments (skin integrity, edema, etc.): pt on 15L HFNC during session, sats in mid to high-90s during activity. HR tachy up to mid 120s observed by PT. RN Marcene Brawn present and assisting with line management throughout session    Exercises     Assessment/Plan    PT Assessment Patient needs continued PT services  PT Problem List Decreased strength;Decreased activity tolerance;Decreased balance;Decreased mobility;Decreased knowledge of use of DME;Decreased safety awareness;Decreased knowledge of precautions;Pain       PT Treatment Interventions DME instruction;Gait training;Stair training;Functional mobility training;Therapeutic activities;Therapeutic exercise;Balance training;Patient/family education    PT Goals (Current goals can be found in the Care Plan section)  Acute Rehab PT Goals Patient Stated Goal: to return to independence PT Goal Formulation: With patient/family Time For Goal Achievement: 04/17/20 Potential to Achieve Goals: Good    Frequency Min 3X/week   Barriers to discharge        Co-evaluation               AM-PAC PT "6 Clicks" Mobility  Outcome Measure Help needed turning from your back to your side while in a flat bed without using bedrails?: A Little Help needed moving from lying on your back to sitting on the side of a flat bed without using bedrails?: A Little Help needed moving to and from a bed to a chair (including a wheelchair)?: A Little Help needed standing up from a chair using your arms (e.g., wheelchair or bedside chair)?: A Little Help needed to walk in hospital room?: A Little Help needed climbing 3-5 steps with a railing? : A Lot 6 Click Score: 17    End of  Session Equipment Utilized During Treatment: Oxygen Activity Tolerance: Patient tolerated treatment well Patient left: in bed;with call bell/phone within reach;with bed alarm set;with family/visitor present;with nursing/sitter in room Nurse Communication: Mobility status PT Visit Diagnosis: Other abnormalities of gait and mobility (R26.89);Muscle weakness (generalized) (M62.81)    Time: 7858-8502 PT Time Calculation (min) (ACUTE ONLY): 33 min   Charges:   PT Evaluation $PT Eval Moderate Complexity: 1 Mod PT Treatments $Gait Training: 8-22 mins        Zenaida Niece, PT, DPT Acute Rehabilitation Pager: [phone]   Zenaida Niece 04/03/2020, 3:11 PM

## 2020-04-03 NOTE — Progress Notes (Signed)
Patient ID: Clayton Hernandez, male   DOB: 09-Mar-1971, 49 y.o.   MRN: [id number] TCTS DAILY ICU PROGRESS NOTE                   [address]            [place] [address]          [phone]   2 Days Post-Op Procedure(s) (LRB): THORACIC ASCENDING ANEURYSM REPAIR (AAA) USING HEMASHIELD PLATINUM 30 MM VASCULAR SHIELD. (N/A) TRANSESOPHAGEAL ECHOCARDIOGRAM (TEE) (N/A)  Total Length of Stay:  LOS: 2 days   Subjective: Awake alert sitting in chair this morning, currently using PCA pump.  Objective: Vital signs in last 24 hours: Temp:  [97.5 F (36.4 C)-98.78 F (37.1 C)] 97.8 F (36.6 C) (12/18 0724) Pulse Rate:  [92-105] 105 (12/17 2104) Cardiac Rhythm: Sinus tachycardia (12/18 0347) Resp:  [14-35] 17 (12/18 0700) BP: (90-165)/(65-82) 90/65 (12/18 0700) SpO2:  [89 %-94 %] 94 % (12/18 0700) Arterial Line BP: (125-134)/(67-69) 125/67 (12/17 0900) FiO2 (%):  [45 %] 45 % (12/18 0347) Weight:  [100.8 kg] 100.8 kg (12/18 0500)  Filed Weights   04/01/20 0609 04/02/20 0500 04/03/20 0500  Weight: 97.5 kg 104.9 kg 100.8 kg    Weight change: -4.1 kg   Hemodynamic parameters for last 24 hours: PAP: (37)/(15) 37/15  Intake/Output from previous day: 12/17 0701 - 12/18 0700 In: 1335.7 [I.V.:335.7] Out: 3425 [Urine:2925; Chest Tube:500]  Intake/Output this shift: No intake/output data recorded.  Current Meds: Scheduled Meds: . sodium chloride   Intravenous Once  . sodium chloride   Intravenous Once  . sodium chloride   Intravenous Once  . acetaminophen  1,000 mg Oral Q6H   Or  . acetaminophen (TYLENOL) oral liquid 160 mg/5 mL  1,000 mg Per Tube Q6H  . aspirin EC  325 mg Oral Daily   Or  . aspirin  324 mg Per Tube Daily  . bisacodyl  10 mg Oral Daily   Or  . bisacodyl  10 mg Rectal Daily  . Chlorhexidine Gluconate Cloth  6 each Topical Daily  . diazepam  2 mg Oral Q8H  . docusate sodium  200 mg Oral Daily  . fentaNYL   Intravenous Q4H  . furosemide  40  mg Intravenous BID  . insulin aspart  0-15 Units Subcutaneous TID WC  . insulin aspart  0-5 Units Subcutaneous QHS  . metoprolol tartrate  12.5 mg Oral BID   Or  . metoprolol tartrate  12.5 mg Per Tube BID  . pantoprazole  40 mg Oral Daily  . sodium chloride flush  3 mL Intravenous Q12H   Continuous Infusions: . sodium chloride Stopped (04/02/20 0908)  . sodium chloride    . sodium chloride 10 mL/hr at 04/01/20 1353  . lactated ringers    . lactated ringers Stopped (04/01/20 1524)  . lactated ringers Stopped (04/01/20 1603)   PRN Meds:.sodium chloride, diphenhydrAMINE **OR** diphenhydrAMINE, fentaNYL (SUBLIMAZE) injection, lactated ringers, metoprolol tartrate, naloxone **AND** sodium chloride flush, ondansetron (ZOFRAN) IV, oxyCODONE, promethazine, sodium chloride flush, traMADol  General appearance: alert, cooperative and no distress Neurologic: intact Heart: regular rate and rhythm, S1, S2 normal, no murmur, click, rub or gallop Lungs: clear to auscultation bilaterally Abdomen: soft, non-tender; bowel sounds normal; no masses,  no organomegaly Extremities: extremities normal, atraumatic, no cyanosis or edema Wound: Sternum stable  Lab Results: CBC: Recent Labs    04/02/20 1812 04/03/20 0528  WBC 13.2* 15.8*  HGB 13.9 13.1  HCT 40.1 38.6*  PLT 105* 113*   BMET:  Recent Labs    04/02/20 1812 04/03/20 0528  NA 138 136  K 4.1 4.4  CL 101 102  CO2 24 25  GLUCOSE 140* 166*  BUN 18 24*  CREATININE 1.17 1.25*  CALCIUM 8.4* 8.5*    CMET: Lab Results  Component Value Date   WBC 15.8 (H) 04/03/2020   HGB 13.1 04/03/2020   HCT 38.6 (L) 04/03/2020   PLT 113 (L) 04/03/2020   GLUCOSE 166 (H) 04/03/2020   CHOL 137 01/30/2019   TRIG 156.0 (H) 01/30/2019   HDL 39.00 (L) 01/30/2019   LDLCALC 67 01/30/2019   ALT 37 03/31/2020   AST 37 03/31/2020   NA 136 04/03/2020   K 4.4 04/03/2020   CL 102 04/03/2020   CREATININE 1.25 (H) 04/03/2020   BUN 24 (H) 04/03/2020    CO2 25 04/03/2020   INR 1.5 (H) 04/01/2020   HGBA1C 5.6 03/31/2020      PT/INR:  Recent Labs    04/01/20 1354  LABPROT 17.3*  INR 1.5*   Radiology: No results found.   Assessment/Plan: S/P Procedure(s) (LRB): THORACIC ASCENDING ANEURYSM REPAIR (AAA) USING HEMASHIELD PLATINUM 30 MM VASCULAR SHIELD. (N/A) TRANSESOPHAGEAL ECHOCARDIOGRAM (TEE) (N/A) Mobilize Diuresis d/c tubes/lines Expected Acute  Blood - loss Anemia- continue to monitor  Very anxious D/c foley Renal function stable   Grace Isaac 04/03/2020 7:29 AM

## 2020-04-03 NOTE — Progress Notes (Signed)
Patient ID: Clayton Hernandez, male   DOB: 10-Feb-1971, 49 y.o.   MRN: [id number] EVENING ROUNDS NOTE :     [address]       [place] [address]             [phone]                 2 Days Post-Op Procedure(s) (LRB): THORACIC ASCENDING ANEURYSM REPAIR (AAA) USING HEMASHIELD PLATINUM 30 MM VASCULAR SHIELD. (N/A) TRANSESOPHAGEAL ECHOCARDIOGRAM (TEE) (N/A)  Total Length of Stay:  LOS: 2 days  BP 127/83   Pulse 93   Temp 98.3 F (36.8 C) (Oral)   Resp (!) 25   Ht 6\' 2"  (1.88 m)   Wt 100.8 kg   SpO2 (!) 85%   BMI 28.53 kg/m   .Intake/Output      12/17 0701 12/18 0700 12/18 0701 12/19 0700   I.V. (mL/kg) 335.7 (3.3)    Blood     Other 1000    IV Piggyback     Total Intake(mL/kg) 1335.7 (13.3)    Urine (mL/kg/hr) 3025 (1.3) 1250 (1)   Blood     Chest Tube 500 40   Total Output 3525 1290   Net -2189.3 -1290          . sodium chloride Stopped (04/02/20 0908)  . sodium chloride    . sodium chloride 10 mL/hr at 04/01/20 1353     Lab Results  Component Value Date   WBC 15.8 (H) 04/03/2020   HGB 13.1 04/03/2020   HCT 38.6 (L) 04/03/2020   PLT 113 (L) 04/03/2020   GLUCOSE 166 (H) 04/03/2020   CHOL 137 01/30/2019   TRIG 156.0 (H) 01/30/2019   HDL 39.00 (L) 01/30/2019   LDLCALC 67 01/30/2019   ALT 37 03/31/2020   AST 37 03/31/2020   NA 136 04/03/2020   K 4.4 04/03/2020   CL 102 04/03/2020   CREATININE 1.25 (H) 04/03/2020   BUN 24 (H) 04/03/2020   CO2 25 04/03/2020   INR 1.5 (H) 04/01/2020   HGBA1C 5.6 03/31/2020   Patient feels better than this morning, less anxious, pain control is better tubes out Holding sinus rhythm Foley to come out tonight   Grace Isaac MD  Beeper [phone] Office [phone] 04/03/2020 6:55 PM

## 2020-04-04 ENCOUNTER — Inpatient Hospital Stay (HOSPITAL_COMMUNITY): Payer: BC Managed Care – PPO

## 2020-04-04 LAB — CBC
HCT: 36.1 % — ABNORMAL LOW (ref 39.0–52.0)
Hemoglobin: 12 g/dL — ABNORMAL LOW (ref 13.0–17.0)
MCH: 29.9 pg (ref 26.0–34.0)
MCHC: 33.2 g/dL (ref 30.0–36.0)
MCV: 89.8 fL (ref 80.0–100.0)
Platelets: 114 10*3/uL — ABNORMAL LOW (ref 150–400)
RBC: 4.02 MIL/uL — ABNORMAL LOW (ref 4.22–5.81)
RDW: 13.4 % (ref 11.5–15.5)
WBC: 14.8 10*3/uL — ABNORMAL HIGH (ref 4.0–10.5)
nRBC: 0 % (ref 0.0–0.2)

## 2020-04-04 LAB — BASIC METABOLIC PANEL
Anion gap: 8 (ref 5–15)
BUN: 33 mg/dL — ABNORMAL HIGH (ref 6–20)
CO2: 28 mmol/L (ref 22–32)
Calcium: 8.7 mg/dL — ABNORMAL LOW (ref 8.9–10.3)
Chloride: 101 mmol/L (ref 98–111)
Creatinine, Ser: 1.35 mg/dL — ABNORMAL HIGH (ref 0.61–1.24)
GFR, Estimated: >60 mL/min (ref >60)
Glucose, Bld: 145 mg/dL — ABNORMAL HIGH (ref 70–99)
Potassium: 4.1 mmol/L (ref 3.5–5.1)
Sodium: 137 mmol/L (ref 135–145)

## 2020-04-04 LAB — GLUCOSE, CAPILLARY
Glucose-Capillary: 181 mg/dL — ABNORMAL HIGH (ref 70–99)
Glucose-Capillary: 161 mg/dL — ABNORMAL HIGH (ref 70–99)
Glucose-Capillary: 108 mg/dL — ABNORMAL HIGH (ref 70–99)
Glucose-Capillary: 133 mg/dL — ABNORMAL HIGH (ref 70–99)

## 2020-04-04 MED ORDER — SODIUM CHLORIDE 0.9 % IV SOLN: 250.0000 mL | INTRAVENOUS | Status: DC | PRN

## 2020-04-04 MED ORDER — SODIUM CHLORIDE 0.9% FLUSH
3.0000 mL | Freq: Two times a day (BID) | INTRAVENOUS | Status: DC
  Administered 2020-04-04 – 2020-04-07 (×8): 3 mL via INTRAVENOUS

## 2020-04-04 MED ORDER — ALUM & MAG HYDROXIDE-SIMETH 200-200-20 MG/5ML PO SUSP
15.0000 mL | ORAL | Status: DC | PRN
  Administered 2020-04-04 – 2020-04-06 (×4): 30 mL via ORAL
  Filled 2020-04-04 (×5): qty 30

## 2020-04-04 MED ORDER — SODIUM CHLORIDE 0.9% FLUSH: 3.0000 mL | INTRAVENOUS | Status: DC | PRN

## 2020-04-04 MED ORDER — ENOXAPARIN SODIUM 40 MG/0.4ML ~~LOC~~ SOLN
40.0000 mg | SUBCUTANEOUS | Status: DC
  Administered 2020-04-04 – 2020-04-07 (×4): 40 mg via SUBCUTANEOUS
  Filled 2020-04-04 (×4): qty 0.4

## 2020-04-04 MED ORDER — FUROSEMIDE 10 MG/ML IJ SOLN
40.0000 mg | Freq: Once | INTRAMUSCULAR | Status: AC
  Administered 2020-04-04: 20:00:00 40 mg via INTRAVENOUS
  Filled 2020-04-04: qty 4

## 2020-04-04 MED ORDER — OXYCODONE HCL 5 MG PO TABS
5.0000 mg | ORAL_TABLET | ORAL | Status: DC | PRN
  Administered 2020-04-04 – 2020-04-08 (×15): 10 mg via ORAL
  Filled 2020-04-04: qty 2
  Filled 2020-04-04: qty 1
  Filled 2020-04-04 (×13): qty 2
  Filled 2020-04-04: qty 1
  Filled 2020-04-04 (×2): qty 2

## 2020-04-04 MED ORDER — ACETAMINOPHEN 325 MG PO TABS
650.0000 mg | ORAL_TABLET | Freq: Four times a day (QID) | ORAL | Status: DC | PRN
  Administered 2020-04-04 – 2020-04-07 (×5): 650 mg via ORAL
  Filled 2020-04-04 (×6): qty 2

## 2020-04-04 MED ORDER — ENOXAPARIN SODIUM 40 MG/0.4ML ~~LOC~~ SOLN: 40.0000 mg | SUBCUTANEOUS | Status: DC

## 2020-04-04 MED ORDER — TRAMADOL HCL 50 MG PO TABS
50.0000 mg | ORAL_TABLET | ORAL | Status: DC | PRN
  Administered 2020-04-04 – 2020-04-05 (×4): 100 mg via ORAL
  Administered 2020-04-06: 50 mg via ORAL
  Administered 2020-04-06: 100 mg via ORAL
  Administered 2020-04-06: 50 mg via ORAL
  Administered 2020-04-06 (×3): 100 mg via ORAL
  Administered 2020-04-07: 50 mg via ORAL
  Administered 2020-04-07 (×2): 100 mg via ORAL
  Filled 2020-04-04 (×13): qty 2

## 2020-04-04 MED ORDER — ROSUVASTATIN CALCIUM 20 MG PO TABS: 20.0000 mg | ORAL_TABLET | Freq: Every day | ORAL | Status: DC

## 2020-04-04 MED ORDER — ~~LOC~~ CARDIAC SURGERY, PATIENT & FAMILY EDUCATION: Freq: Once | Status: AC

## 2020-04-04 MED ORDER — ROSUVASTATIN CALCIUM 20 MG PO TABS
20.0000 mg | ORAL_TABLET | Freq: Every day | ORAL | Status: DC
  Administered 2020-04-04 – 2020-04-07 (×4): 20 mg via ORAL
  Filled 2020-04-04 (×4): qty 1

## 2020-04-04 MED ORDER — BISACODYL 5 MG PO TBEC
10.0000 mg | DELAYED_RELEASE_TABLET | Freq: Every day | ORAL | Status: DC | PRN
Start: 2020-04-04 — End: 2020-04-08
  Administered 2020-04-04 – 2020-04-05 (×2): 10 mg via ORAL
  Filled 2020-04-04 (×2): qty 2

## 2020-04-04 MED ORDER — ONDANSETRON HCL 4 MG/2ML IJ SOLN
4.0000 mg | Freq: Four times a day (QID) | INTRAMUSCULAR | Status: DC | PRN
  Administered 2020-04-04: 19:00:00 4 mg via INTRAVENOUS
  Filled 2020-04-04: qty 2

## 2020-04-04 MED ORDER — METOPROLOL TARTRATE 12.5 MG HALF TABLET
12.5000 mg | ORAL_TABLET | Freq: Two times a day (BID) | ORAL | Status: DC
  Administered 2020-04-04 – 2020-04-08 (×8): 12.5 mg via ORAL
  Filled 2020-04-04 (×8): qty 1

## 2020-04-04 MED ORDER — POTASSIUM CHLORIDE CRYS ER 10 MEQ PO TBCR
10.0000 meq | EXTENDED_RELEASE_TABLET | Freq: Every day | ORAL | Status: DC
  Administered 2020-04-04 – 2020-04-05 (×2): 10 meq via ORAL
  Filled 2020-04-04 (×3): qty 1

## 2020-04-04 MED ORDER — ASPIRIN EC 325 MG PO TBEC
325.0000 mg | DELAYED_RELEASE_TABLET | Freq: Every day | ORAL | Status: DC
  Administered 2020-04-05 – 2020-04-08 (×4): 325 mg via ORAL
  Filled 2020-04-04 (×4): qty 1

## 2020-04-04 MED ORDER — PANTOPRAZOLE SODIUM 40 MG PO TBEC
40.0000 mg | DELAYED_RELEASE_TABLET | Freq: Every day | ORAL | Status: DC
  Administered 2020-04-05 – 2020-04-08 (×4): 40 mg via ORAL
  Filled 2020-04-04 (×4): qty 1

## 2020-04-04 MED ORDER — BISACODYL 10 MG RE SUPP
10.0000 mg | Freq: Every day | RECTAL | Status: DC | PRN
Start: 2020-04-04 — End: 2020-04-08

## 2020-04-04 MED ORDER — GUAIFENESIN ER 600 MG PO TB12
600.0000 mg | ORAL_TABLET | Freq: Two times a day (BID) | ORAL | Status: DC | PRN
  Administered 2020-04-04 – 2020-04-05 (×3): 600 mg via ORAL
  Filled 2020-04-04 (×3): qty 1

## 2020-04-04 MED ORDER — INSULIN ASPART 100 UNIT/ML ~~LOC~~ SOLN
0.0000 [IU] | Freq: Three times a day (TID) | SUBCUTANEOUS | Status: DC
  Administered 2020-04-04: 22:00:00 2 [IU] via SUBCUTANEOUS
  Administered 2020-04-04: 13:00:00 4 [IU] via SUBCUTANEOUS
  Administered 2020-04-05: 21:00:00 2 [IU] via SUBCUTANEOUS
  Administered 2020-04-07: 22:00:00 4 [IU] via SUBCUTANEOUS
  Administered 2020-04-07 – 2020-04-08 (×3): 2 [IU] via SUBCUTANEOUS

## 2020-04-04 MED ORDER — FUROSEMIDE 40 MG PO TABS
40.0000 mg | ORAL_TABLET | Freq: Every day | ORAL | Status: DC
  Administered 2020-04-05: 09:00:00 40 mg via ORAL
  Filled 2020-04-04 (×2): qty 1

## 2020-04-04 MED ORDER — DOCUSATE SODIUM 100 MG PO CAPS
200.0000 mg | ORAL_CAPSULE | Freq: Every day | ORAL | Status: DC
  Administered 2020-04-05 – 2020-04-06 (×2): 200 mg via ORAL
  Filled 2020-04-04 (×4): qty 2

## 2020-04-04 MED ORDER — ONDANSETRON HCL 4 MG PO TABS
4.0000 mg | ORAL_TABLET | Freq: Four times a day (QID) | ORAL | Status: DC | PRN
  Administered 2020-04-05 (×2): 4 mg via ORAL
  Filled 2020-04-04 (×2): qty 1

## 2020-04-04 NOTE — Progress Notes (Signed)
Patient ID: Clayton Hernandez, male   DOB: 1970-07-20, 49 y.o.   MRN: [id number] TCTS DAILY ICU PROGRESS NOTE                   [address]            [place] [address]          [phone]   3 Days Post-Op Procedure(s) (LRB): THORACIC ASCENDING ANEURYSM REPAIR (AAA) USING HEMASHIELD PLATINUM 30 MM VASCULAR SHIELD. (N/A) TRANSESOPHAGEAL ECHOCARDIOGRAM (TEE) (N/A)  Total Length of Stay:  LOS: 3 days   Subjective: Patient awake alert, notes that he is ambulating around unit without difficulty Foley out last night voiding this morning without difficulty Pain issues are much better controlled currently  Objective: Vital signs in last 24 hours: Temp:  [97.8 F (36.6 C)-99 F (37.2 C)] 97.8 F (36.6 C) (12/19 0832) Pulse Rate:  [93-116] 116 (12/19 0700) Cardiac Rhythm: Normal sinus rhythm (12/19 0700) Resp:  [13-27] 15 (12/19 0905) BP: (91-132)/(67-88) 112/75 (12/19 0800) SpO2:  [85 %-97 %] 97 % (12/19 0905) Weight:  [100.2 kg] 100.2 kg (12/19 0500)  Filed Weights   04/02/20 0500 04/03/20 0500 04/04/20 0500  Weight: 104.9 kg 100.8 kg 100.2 kg    Weight change: -0.6 kg   Hemodynamic parameters for last 24 hours:    Intake/Output from previous day: 12/18 0701 - 12/19 0700 In: 530 [P.O.:530] Out: 1590 [Urine:1550; Chest Tube:40]  Intake/Output this shift: Total I/O In: 49 [I.V.:49] Out: -   Current Meds: Scheduled Meds: . sodium chloride   Intravenous Once  . sodium chloride   Intravenous Once  . sodium chloride   Intravenous Once  . acetaminophen  1,000 mg Oral Q6H   Or  . acetaminophen (TYLENOL) oral liquid 160 mg/5 mL  1,000 mg Per Tube Q6H  . aspirin EC  325 mg Oral Daily   Or  . aspirin  324 mg Per Tube Daily  . bisacodyl  10 mg Oral Daily   Or  . bisacodyl  10 mg Rectal Daily  . Chlorhexidine Gluconate Cloth  6 each Topical Daily  . docusate sodium  200 mg Oral Daily  . fentaNYL   Intravenous Q4H  . furosemide  40 mg Intravenous  BID  . insulin aspart  0-15 Units Subcutaneous TID WC  . insulin aspart  0-5 Units Subcutaneous QHS  . metoprolol tartrate  12.5 mg Oral BID   Or  . metoprolol tartrate  12.5 mg Per Tube BID  . pantoprazole  40 mg Oral Daily  . sodium chloride flush  3 mL Intravenous Q12H   Continuous Infusions: . sodium chloride Stopped (04/02/20 0908)  . sodium chloride    . sodium chloride 10 mL/hr at 04/01/20 1353   PRN Meds:.sodium chloride, ALPRAZolam, diphenhydrAMINE **OR** diphenhydrAMINE, fentaNYL (SUBLIMAZE) injection, metoprolol tartrate, naloxone **AND** sodium chloride flush, ondansetron (ZOFRAN) IV, oxyCODONE, promethazine, sodium chloride flush, traMADol  General appearance: alert and no distress Neurologic: intact Heart: regular rate and rhythm, S1, S2 normal, no murmur, click, rub or gallop Lungs: diminished breath sounds bibasilar Abdomen: soft, non-tender; bowel sounds normal; no masses,  no organomegaly Extremities: extremities normal, atraumatic, no cyanosis or edema and Homans sign is negative, no sign of DVT Wound: Sternum stable dressing intact  Lab Results: CBC: Recent Labs    04/03/20 0528 04/04/20 0355  WBC 15.8* 14.8*  HGB 13.1 12.0*  HCT 38.6* 36.1*  PLT 113* 114*   BMET:  Recent Labs  04/03/20 0528 04/04/20 0355  NA 136 137  K 4.4 4.1  CL 102 101  CO2 25 28  GLUCOSE 166* 145*  BUN 24* 33*  CREATININE 1.25* 1.35*  CALCIUM 8.5* 8.7*    CMET: Lab Results  Component Value Date   WBC 14.8 (H) 04/04/2020   HGB 12.0 (L) 04/04/2020   HCT 36.1 (L) 04/04/2020   PLT 114 (L) 04/04/2020   GLUCOSE 145 (H) 04/04/2020   CHOL 137 01/30/2019   TRIG 156.0 (H) 01/30/2019   HDL 39.00 (L) 01/30/2019   LDLCALC 67 01/30/2019   ALT 37 03/31/2020   AST 37 03/31/2020   NA 137 04/04/2020   K 4.1 04/04/2020   CL 101 04/04/2020   CREATININE 1.35 (H) 04/04/2020   BUN 33 (H) 04/04/2020   CO2 28 04/04/2020   INR 1.5 (H) 04/01/2020   HGBA1C 5.6 03/31/2020       PT/INR:  Recent Labs    04/01/20 1354  LABPROT 17.3*  INR 1.5*   Radiology: No results found.   Assessment/Plan: S/P Procedure(s) (LRB): THORACIC ASCENDING ANEURYSM REPAIR (AAA) USING HEMASHIELD PLATINUM 30 MM VASCULAR SHIELD. (N/A) TRANSESOPHAGEAL ECHOCARDIOGRAM (TEE) (N/A) Mobilize Diuresis d/c tubes/lines Plan for transfer to step-down: see transfer orders Renal function stable creatinine 1.3    Grace Isaac 04/04/2020 9:41 AM

## 2020-04-04 NOTE — Progress Notes (Signed)
Waste FENTANYL PCA SYRINGE (1000MCG/20ML)  0902 WASTE 1ML  1130 WAST 20ML (SYRINGE PLUS TUBING/PRIME -D/C'd PCA)  Dario Guardian, RN SECOND WITNESS

## 2020-04-04 NOTE — Progress Notes (Signed)
EVENING ROUNDS NOTE :     [address]       [place] [address]             [phone]                 3 Days Post-Op Procedure(s) (LRB): THORACIC ASCENDING ANEURYSM REPAIR (AAA) USING HEMASHIELD PLATINUM 30 MM VASCULAR SHIELD. (N/A) TRANSESOPHAGEAL ECHOCARDIOGRAM (TEE) (N/A)  Total Length of Stay:  LOS: 3 days  BP 133/90   Pulse (!) 116   Temp 98.5 F (36.9 C)   Resp (!) 24   Ht 6\' 2"  (1.88 m)   Wt 100.2 kg   SpO2 91%   BMI 28.36 kg/m   .Intake/Output      12/18 0701 12/19 0700 12/19 0701 12/20 0700   P.O. 530    I.V. (mL/kg)  52 (0.5)   Other     Total Intake(mL/kg) 530 (5.3) 52 (0.5)   Urine (mL/kg/hr) 1550 (0.6) 400 (0.5)   Stool 0    Chest Tube 40    Total Output 1590 400   Net -1060 -348        Stool Occurrence 1 x      . sodium chloride       Lab Results  Component Value Date   WBC 14.8 (H) 04/04/2020   HGB 12.0 (L) 04/04/2020   HCT 36.1 (L) 04/04/2020   PLT 114 (L) 04/04/2020   GLUCOSE 145 (H) 04/04/2020   CHOL 137 01/30/2019   TRIG 156.0 (H) 01/30/2019   HDL 39.00 (L) 01/30/2019   LDLCALC 67 01/30/2019   ALT 37 03/31/2020   AST 37 03/31/2020   NA 137 04/04/2020   K 4.1 04/04/2020   CL 101 04/04/2020   CREATININE 1.35 (H) 04/04/2020   BUN 33 (H) 04/04/2020   CO2 28 04/04/2020   INR 1.5 (H) 04/01/2020   HGBA1C 5.6 03/31/2020   Walked in hall transfer orders written but will hold since now on increased fio2    Grace Isaac MD  Beeper [phone] Office [phone] 04/04/2020 3:51 PM

## 2020-04-04 NOTE — Progress Notes (Signed)
Physical Therapy Treatment Patient Details Name: Clayton Hernandez MRN: [id number] DOB: Oct 03, 1970 Today's Date: 04/04/2020    History of Present Illness 49 y.o. male presenting to hospital for assessment of thoracic AAA. Pt underwent thoracic AAA repair on 04/01/2020, extubated same day. PMH includes cancer, cirrhosis, diverticulits, HTN, migraines, insomnia, sleep apnea.    PT Comments    Pt making excellent progress towards his physical therapy goals, requiring less assist with mobility and demonstrating increased ambulation distance. Ambulating 1,110 feet with an [place] walker without physical assist. Still requiring high amounts of oxygen, pt satting 87-92% on 15L HFNC, HR 117-121, BP 126/84 post mobility. Pt would continue to benefit from intensive mobilization and stair training prior to discharge.     Follow Up Recommendations  No PT follow up     Equipment Recommendations  Rolling walker with 5" wheels    Recommendations for Other Services       Precautions / Restrictions Precautions Precautions: Fall;Sternal Precaution Booklet Issued: No Precaution Comments: PT verbally reviews sternal precautions with pt Restrictions Other Position/Activity Restrictions: sternal precautions    Mobility  Bed Mobility Overal bed mobility: Needs Assistance Bed Mobility: Rolling;Sit to Sidelying;Sidelying to Sit Rolling: Modified independent (Device/Increase time) Sidelying to sit: Supervision     Sit to sidelying: Min guard    Transfers Overall transfer level: Needs assistance   Transfers: Sit to/from Stand Sit to Stand: Min guard         General transfer comment: use of heart pillow, min cues for sternal precautions  Ambulation/Gait Ambulation/Gait assistance: Supervision Gait Distance (Feet): 1110 Feet Assistive device:  (EVA walker) Gait Pattern/deviations: WFL(Within Functional Limits)         Stairs             Wheelchair Mobility    Modified  Rankin (Stroke Patients Only)       Balance Overall balance assessment: Needs assistance Sitting-balance support: No upper extremity supported;Feet supported Sitting balance-Leahy Scale: Good     Standing balance support: No upper extremity supported;During functional activity Standing balance-Leahy Scale: Good                              Cognition Arousal/Alertness: Awake/alert Behavior During Therapy: WFL for tasks assessed/performed Overall Cognitive Status: Within Functional Limits for tasks assessed                                        Exercises      General Comments        Pertinent Vitals/Pain Pain Assessment: Faces Faces Pain Scale: Hurts little more Pain Location: incision site Pain Descriptors / Indicators: Sore Pain Intervention(s): Monitored during session    Home Living                      Prior Function            PT Goals (current goals can now be found in the care plan section) Acute Rehab PT Goals Patient Stated Goal: to return to independence PT Goal Formulation: With patient/family Time For Goal Achievement: 04/17/20 Potential to Achieve Goals: Good Progress towards PT goals: Progressing toward goals    Frequency    Min 3X/week      PT Plan Discharge plan needs to be updated    Co-evaluation  AM-PAC PT "6 Clicks" Mobility   Outcome Measure  Help needed turning from your back to your side while in a flat bed without using bedrails?: None Help needed moving from lying on your back to sitting on the side of a flat bed without using bedrails?: None Help needed moving to and from a bed to a chair (including a wheelchair)?: None Help needed standing up from a chair using your arms (e.g., wheelchair or bedside chair)?: A Little Help needed to walk in hospital room?: None Help needed climbing 3-5 steps with a railing? : A Little 6 Click Score: 22    End of Session Equipment  Utilized During Treatment: Oxygen Activity Tolerance: Patient tolerated treatment well Patient left: in bed;with call bell/phone within reach;with bed alarm set;with family/visitor present;with nursing/sitter in room Nurse Communication: Mobility status PT Visit Diagnosis: Other abnormalities of gait and mobility (R26.89);Muscle weakness (generalized) (M62.81)     Time: 6979-4801 PT Time Calculation (min) (ACUTE ONLY): 28 min  Charges:  $Therapeutic Activity: 23-37 mins                     Wyona Almas, PT, DPT Acute Rehabilitation Services Pager [phone] Office [phone]    Deno Etienne 04/04/2020, 4:58 PM

## 2020-04-05 ENCOUNTER — Inpatient Hospital Stay (HOSPITAL_COMMUNITY): Payer: BC Managed Care – PPO

## 2020-04-05 DIAGNOSIS — Z8679 Personal history of other diseases of the circulatory system: Secondary | ICD-10-CM

## 2020-04-05 DIAGNOSIS — Z9889 Other specified postprocedural states: Secondary | ICD-10-CM

## 2020-04-05 LAB — BASIC METABOLIC PANEL
Anion gap: 11 (ref 5–15)
BUN: 34 mg/dL — ABNORMAL HIGH (ref 6–20)
CO2: 25 mmol/L (ref 22–32)
Calcium: 8.1 mg/dL — ABNORMAL LOW (ref 8.9–10.3)
Chloride: 97 mmol/L — ABNORMAL LOW (ref 98–111)
Creatinine, Ser: 1.17 mg/dL (ref 0.61–1.24)
GFR, Estimated: >60 mL/min (ref >60)
Glucose, Bld: 193 mg/dL — ABNORMAL HIGH (ref 70–99)
Potassium: 3.6 mmol/L (ref 3.5–5.1)
Sodium: 133 mmol/L — ABNORMAL LOW (ref 135–145)

## 2020-04-05 LAB — BPAM RBC
Blood Product Expiration Date: 202201162359
ISSUE DATE / TIME: 202112161109
Unit Type and Rh: 5100

## 2020-04-05 LAB — GLUCOSE, CAPILLARY
Glucose-Capillary: 164 mg/dL — ABNORMAL HIGH (ref 70–99)
Glucose-Capillary: 123 mg/dL — ABNORMAL HIGH (ref 70–99)
Glucose-Capillary: 152 mg/dL — ABNORMAL HIGH (ref 70–99)
Glucose-Capillary: 121 mg/dL — ABNORMAL HIGH (ref 70–99)

## 2020-04-05 LAB — CBC
HCT: 30.4 % — ABNORMAL LOW (ref 39.0–52.0)
Hemoglobin: 10.1 g/dL — ABNORMAL LOW (ref 13.0–17.0)
MCH: 30.1 pg (ref 26.0–34.0)
MCHC: 33.2 g/dL (ref 30.0–36.0)
MCV: 90.7 fL (ref 80.0–100.0)
Platelets: 143 10*3/uL — ABNORMAL LOW (ref 150–400)
RBC: 3.35 MIL/uL — ABNORMAL LOW (ref 4.22–5.81)
RDW: 13.5 % (ref 11.5–15.5)
WBC: 8.9 10*3/uL (ref 4.0–10.5)
nRBC: 0 % (ref 0.0–0.2)

## 2020-04-05 LAB — TYPE AND SCREEN
ABO/RH(D): O POS
Antibody Screen: NEGATIVE
Unit division: 0

## 2020-04-05 MED ORDER — POTASSIUM CHLORIDE CRYS ER 20 MEQ PO TBCR
40.0000 meq | EXTENDED_RELEASE_TABLET | Freq: Once | ORAL | Status: AC
  Administered 2020-04-05: 12:00:00 40 meq via ORAL
  Filled 2020-04-05: qty 2

## 2020-04-05 MED ORDER — ALBUTEROL SULFATE (2.5 MG/3ML) 0.083% IN NEBU
2.5000 mg | INHALATION_SOLUTION | Freq: Four times a day (QID) | RESPIRATORY_TRACT | Status: DC
  Administered 2020-04-05 (×3): 2.5 mg via RESPIRATORY_TRACT
  Filled 2020-04-05 (×3): qty 3

## 2020-04-05 MED ORDER — POLYETHYLENE GLYCOL 3350 17 G PO PACK
17.0000 g | PACK | Freq: Every day | ORAL | Status: DC
  Administered 2020-04-05 – 2020-04-06 (×2): 17 g via ORAL
  Filled 2020-04-05 (×4): qty 1

## 2020-04-05 NOTE — Progress Notes (Signed)
Physical Therapy Treatment Patient Details Name: Clayton Hernandez MRN: [id number] DOB: [birth year] Today's Date: 04/05/2020    History of Present Illness 49 y.o. male presenting to hospital for assessment of thoracic AAA. Pt underwent thoracic AAA repair on 04/01/2020, extubated same day. PMH includes cancer, cirrhosis, diverticulits, HTN, migraines, insomnia, sleep apnea.    PT Comments    Pt admitted with above diagnosis. Pt was able to ambulate with RW with supervision on unit. Pt needed 4LO2 to keep sats >90%.  Pt on 3L O2 in room on arrival. Progressing with distance and used RW instead of EVa walker today.  Pt currently with functional limitations due to balance and endurance deficits. Pt will benefit from skilled PT to increase their independence and safety with mobility to allow discharge to the venue listed below.     Follow Up Recommendations  No PT follow up     Equipment Recommendations  Rolling walker with 5" wheels (May progress and not need RW)    Recommendations for Other Services       Precautions / Restrictions Precautions Precautions: Fall;Sternal Precaution Booklet Issued: No Precaution Comments: PT verbally reviews sternal precautions with pt Restrictions Other Position/Activity Restrictions: sternal precautions    Mobility  Bed Mobility               General bed mobility comments: Pt in chair on arrival  Transfers Overall transfer level: Needs assistance   Transfers: Sit to/from Stand Sit to Stand: Min guard         General transfer comment: use of heart pillow, min cues for sternal precautions  Ambulation/Gait Ambulation/Gait assistance: Supervision Gait Distance (Feet): 1000 Feet Assistive device: Rolling walker (2 wheeled) Gait Pattern/deviations: WFL(Within Functional Limits) Gait velocity: reduced Gait velocity interpretation: <1.8 ft/sec, indicate of risk for recurrent falls General Gait Details: no significant balance  deviations noted   Stairs             Wheelchair Mobility    Modified Rankin (Stroke Patients Only)       Balance Overall balance assessment: Needs assistance Sitting-balance support: No upper extremity supported;Feet supported Sitting balance-Leahy Scale: Good     Standing balance support: No upper extremity supported;During functional activity Standing balance-Leahy Scale: Good Standing balance comment: reliant on UE support of RW                            Cognition Arousal/Alertness: Awake/alert Behavior During Therapy: WFL for tasks assessed/performed Overall Cognitive Status: Within Functional Limits for tasks assessed                                        Exercises      General Comments General comments (skin integrity, edema, etc.): Pt on 3LO2 on arrival. Ambulated with 4LO2 with sats 90% and >.  HR Stable as well.      Pertinent Vitals/Pain Pain Assessment: Faces Faces Pain Scale: Hurts little more Pain Location: incision site Pain Descriptors / Indicators: Sore Pain Intervention(s): Limited activity within patient's tolerance;Monitored during session;Repositioned    Home Living                      Prior Function            PT Goals (current goals can now be found in the care plan section) Acute Rehab PT Goals  Patient Stated Goal: to return to independence Progress towards PT goals: Progressing toward goals    Frequency    Min 3X/week      PT Plan Current plan remains appropriate    Co-evaluation              AM-PAC PT "6 Clicks" Mobility   Outcome Measure  Help needed turning from your back to your side while in a flat bed without using bedrails?: None Help needed moving from lying on your back to sitting on the side of a flat bed without using bedrails?: None Help needed moving to and from a bed to a chair (including a wheelchair)?: None Help needed standing up from a chair using your  arms (e.g., wheelchair or bedside chair)?: A Little Help needed to walk in hospital room?: None Help needed climbing 3-5 steps with a railing? : A Little 6 Click Score: 22    End of Session Equipment Utilized During Treatment: Oxygen;Gait belt Activity Tolerance: Patient tolerated treatment well Patient left: with call bell/phone within reach;with family/visitor present;in chair Nurse Communication: Mobility status PT Visit Diagnosis: Other abnormalities of gait and mobility (R26.89);Muscle weakness (generalized) (M62.81)     Time: 3143-8887 PT Time Calculation (min) (ACUTE ONLY): 25 min  Charges:  $Gait Training: 23-37 mins                     Keyonta Madrid W,PT [place] Pager:  [phone]  Office:  [place] 04/05/2020, 3:02 PM

## 2020-04-05 NOTE — Addendum Note (Signed)
Addendum  created 04/05/20 1023 by Josephine Igo, CRNA   Order list changed, Pharmacy for encounter modified

## 2020-04-05 NOTE — Progress Notes (Signed)
CARDIAC REHAB PHASE I   PRE:  Rate/Rhythm: 111 ST  BP:  Sitting: 96/57      SaO2: 93 7L  MODE:  Ambulation: 1110 ft   POST:  Rate/Rhythm: 115 ST  BP:  Sitting: 103/67    SaO2: 97 6L  Pt ambulated 1164ft in hallway independently with EVA. Pt denies CP, SOB, or dizziness. Pt returned to room and able to expectorate think greenish mucus. Encouraged continued ambulation, IS use, and using heart pillow to cough. Call bell and bedside table within reach. Will continue to follow.  [phone] Rufina Falco, RN BSN 04/05/2020 2:56 PM

## 2020-04-05 NOTE — Progress Notes (Signed)
4 Days Post-Op Procedure(s) (LRB): THORACIC ASCENDING ANEURYSM REPAIR (AAA) USING HEMASHIELD PLATINUM 30 MM VASCULAR SHIELD. (N/A) TRANSESOPHAGEAL ECHOCARDIOGRAM (TEE) (N/A) Subjective: Coughed up some thick mucous. Oxygen requirement down to 3L from 15 L last night. Ambulating well.  Objective: Vital signs in last 24 hours: Temp:  [97.8 F (36.6 C)-98.6 F (37 C)] 98.2 F (36.8 C) (12/20 0645) Cardiac Rhythm: Sinus tachycardia (12/20 0400) Resp:  [12-33] 13 (12/20 0700) BP: (90-133)/(52-100) 106/77 (12/20 0700) SpO2:  [69 %-99 %] 96 % (12/20 0700) FiO2 (%):  [50 %] 50 % (12/19 0953) Weight:  [96.5 kg] 96.5 kg (12/20 0500)  Hemodynamic parameters for last 24 hours:    Intake/Output from previous day: 12/19 0701 - 12/20 0700 In: 532 [P.O.:480; I.V.:52] Out: 2180 [Urine:2180] Intake/Output this shift: No intake/output data recorded.  General appearance: alert and cooperative Neurologic: intact Heart: regular rate and rhythm, S1, S2 normal, no murmur Lungs: diminished breath sounds bibasilar Extremities: no edema Wound: incision ok  Lab Results: Recent Labs    04/03/20 0528 04/04/20 0355  WBC 15.8* 14.8*  HGB 13.1 12.0*  HCT 38.6* 36.1*  PLT 113* 114*   BMET:  Recent Labs    04/03/20 0528 04/04/20 0355  NA 136 137  K 4.4 4.1  CL 102 101  CO2 25 28  GLUCOSE 166* 145*  BUN 24* 33*  CREATININE 1.25* 1.35*  CALCIUM 8.5* 8.7*    PT/INR: No results for input(s): LABPROT, INR in the last 72 hours. ABG    Component Value Date/Time   PHART 7.330 (L) 04/01/2020 1853   HCO3 22.5 04/01/2020 1853   TCO2 24 04/01/2020 1853   ACIDBASEDEF 3.0 (H) 04/01/2020 1853   O2SAT 94.0 04/01/2020 1853   CBG (last 3)  Recent Labs    04/04/20 1454 04/04/20 2153 04/05/20 0643  GLUCAP 108* 133* 164*   CXR: bibasilar atelectasis  Assessment/Plan: S/P Procedure(s) (LRB): THORACIC ASCENDING ANEURYSM REPAIR (AAA) USING HEMASHIELD PLATINUM 30 MM VASCULAR SHIELD.  (N/A) TRANSESOPHAGEAL ECHOCARDIOGRAM (TEE) (N/A)  POD 4  Hemodynamically stable in sinus rhythm. Continue low dose beta blocker.  Bibasilar atelectasis and high oxygen requirement: improving with IS, Mucinex, ambulation. Down to 3L. Will see how he does this am before considering transfer. He had significant desaturations yesterday.  Volume excess: -1600 cc yesterday. Wt only 2 lbs over preop if accurate. On lasix.  Hyperglycemia: probably related to clear liquid diet. No hx of DM and normal Hgb A1c preop. Will continue SSI for now.  Continue IS, ambulation.   LOS: 4 days    Gaye Pollack 04/05/2020

## 2020-04-05 NOTE — Progress Notes (Signed)
EVENING ROUNDS NOTE :     [address]       [place] [address]             [phone]                 4 Days Post-Op Procedure(s) (LRB): THORACIC ASCENDING ANEURYSM REPAIR (AAA) USING HEMASHIELD PLATINUM 30 MM VASCULAR SHIELD. (N/A) TRANSESOPHAGEAL ECHOCARDIOGRAM (TEE) (N/A)   Total Length of Stay:  LOS: 4 days  Events:   Ambulated today in the unit On and off HF [place] Looks comfortable    BP (!) 99/57   Pulse (!) 104   Temp 97.9 F (36.6 C) (Oral)   Resp 17   Ht 6\' 2"  (1.88 m)   Wt 96.5 kg   SpO2 95%   BMI 27.31 kg/m      FiO2 (%):  [28 %] 28 %  . sodium chloride      I/O last 3 completed shifts: In: 1062 [P.O.:1010; I.V.:52] Out: 2480 [Urine:2480]   CBC Latest Ref Rng & Units 04/05/2020 04/04/2020 04/03/2020  WBC 4.0 - 10.5 K/uL 8.9 14.8(H) 15.8(H)  Hemoglobin 13.0 - 17.0 g/dL 10.1(L) 12.0(L) 13.1  Hematocrit 39.0 - 52.0 % 30.4(L) 36.1(L) 38.6(L)  Platelets 150 - 400 K/uL 143(L) 114(L) 113(L)    BMP Latest Ref Rng & Units 04/05/2020 04/04/2020 04/03/2020  Glucose 70 - 99 mg/dL 193(H) 145(H) 166(H)  BUN 6 - 20 mg/dL 34(H) 33(H) 24(H)  Creatinine 0.61 - 1.24 mg/dL 1.17 1.35(H) 1.25(H)  BUN/Creat Ratio 9 - 20 - - -  Sodium 135 - 145 mmol/L 133(L) 137 136  Potassium 3.5 - 5.1 mmol/L 3.6 4.1 4.4  Chloride 98 - 111 mmol/L 97(L) 101 102  CO2 22 - 32 mmol/L 25 28 25   Calcium 8.9 - 10.3 mg/dL 8.1(L) 8.7(L) 8.5(L)    ABG    Component Value Date/Time   PHART 7.330 (L) 04/01/2020 1853   PCO2ART 42.6 04/01/2020 1853   PO2ART 74 (L) 04/01/2020 1853   HCO3 22.5 04/01/2020 1853   TCO2 24 04/01/2020 1853   ACIDBASEDEF 3.0 (H) 04/01/2020 1853   O2SAT 94.0 04/01/2020 1853       Melodie Bouillon, MD 04/05/2020 3:16 PM

## 2020-04-05 NOTE — Progress Notes (Signed)
CARDIAC REHAB PHASE I   Offered to walk with pt. Pt states recent return from walking 4 laps. Encouraged continued ambulation and IS use. Pt currently demonstrating 803 on IS. Will return as time allows to continue to encourage ambulation.  [phone] Rufina Falco, RN BSN 04/05/2020 1:33 PM

## 2020-04-05 NOTE — Progress Notes (Signed)
Progress Note  Patient Name: Clayton Hernandez Date of Encounter: 04/05/2020  [facility] Cardiologist: [name], MD     Subjective   49 yo with recent ascending aortic aneurism repair Coughing up lots of thick mucus   Inpatient Medications    Scheduled Meds: . aspirin EC  325 mg Oral Daily  . docusate sodium  200 mg Oral Daily  . enoxaparin (LOVENOX) injection  40 mg Subcutaneous Q24H  . furosemide  40 mg Oral Daily  . insulin aspart  0-24 Units Subcutaneous TID AC & HS  . metoprolol tartrate  12.5 mg Oral BID  . pantoprazole  40 mg Oral QAC breakfast  . potassium chloride  10 mEq Oral Daily  . rosuvastatin  20 mg Oral Daily  . sodium chloride flush  3 mL Intravenous Q12H   Continuous Infusions: . sodium chloride     PRN Meds: sodium chloride, acetaminophen, ALPRAZolam, alum & mag hydroxide-simeth, bisacodyl **OR** bisacodyl, guaiFENesin, ondansetron **OR** ondansetron (ZOFRAN) IV, oxyCODONE, sodium chloride flush, traMADol   Vital Signs    Vitals:   04/05/20 0700 04/05/20 0730 04/05/20 0800 04/05/20 0830  BP: 106/77  109/86   Pulse: (!) 109     Resp: 13 20 (!) 24 20  Temp:      TempSrc:      SpO2: 96% 95% 93% (!) 86%  Weight:      Height:        Intake/Output Summary (Last 24 hours) at 04/05/2020 0850 Last data filed at 04/05/2020 0800 Gross per 24 hour  Intake 532 ml  Output 2430 ml  Net -1898 ml   Last 3 Weights 04/05/2020 04/04/2020 04/03/2020  Weight (lbs) 212 lb 11.9 oz 220 lb 14.4 oz 222 lb 3.6 oz  Weight (kg) 96.5 kg 100.2 kg 100.8 kg      Telemetry    Sinus tach  - Personally Reviewed  ECG     - Personally Reviewed  Physical Exam   GEN: No acute distress.   Healthy , middle age  Neck: No JVD Cardiac: RRR, no murmurs, rubs, or gallops.  Respiratory: consolidation in lower 1/2 left lung field , reduced breath sounds GI: Soft, nontender, non-distended  MS: No edema; No deformity. Neuro:  Nonfocal  Psych: Normal affect    Labs    High Sensitivity Troponin:  No results for input(s): TROPONINIHS in the last 720 hours.    Chemistry Recent Labs  Lab 03/31/20 0922 04/01/20 0808 04/03/20 0528 04/04/20 0355 04/05/20 0750  NA 136   < > 136 137 133*  K 4.0   < > 4.4 4.1 3.6  CL 105   < > 102 101 97*  CO2 22   < > 25 28 25   GLUCOSE 103*   < > 166* 145* 193*  BUN 15   < > 24* 33* 34*  CREATININE 1.07   < > 1.25* 1.35* 1.17  CALCIUM 9.4   < > 8.5* 8.7* 8.1*  PROT 7.1  --   --   --   --   ALBUMIN 4.1  --   --   --   --   AST 37  --   --   --   --   ALT 37  --   --   --   --   ALKPHOS 78  --   --   --   --   BILITOT 1.3*  --   --   --   --  GFRNONAA >60   < > >60 >60 >60  ANIONGAP 9   < > 9 8 11    < > = values in this interval not displayed.     Hematology Recent Labs  Lab 04/03/20 0528 04/04/20 0355 04/05/20 0750  WBC 15.8* 14.8* 8.9  RBC 4.39 4.02* 3.35*  HGB 13.1 12.0* 10.1*  HCT 38.6* 36.1* 30.4*  MCV 87.9 89.8 90.7  MCH 29.8 29.9 30.1  MCHC 33.9 33.2 33.2  RDW 13.4 13.4 13.5  PLT 113* 114* 143*    BNPNo results for input(s): BNP, PROBNP in the last 168 hours.   DDimer No results for input(s): DDIMER in the last 168 hours.   Radiology    DG Chest 2 View  Result Date: 04/05/2020 CLINICAL DATA:  Postop evaluation. EXAM: CHEST - 2 VIEW COMPARISON:  04/04/2020. FINDINGS: Interim removal of right IJ sheath. Prior median sternotomy. Heart size normal. Low lung volumes with bibasilar atelectasis, slightly improved. Persistent left base infiltrate, slightly improved. Persistent small bilateral pleural effusions. No pneumothorax. Improved gastric distention. IMPRESSION: 1. Interim removal of right IJ sheath. 2. Prior median sternotomy. Heart size normal. 3. Low lung volumes with bibasilar atelectasis, slightly improved. Persistent left base infiltrate, slightly improved. 4. Improved gastric distention. Electronically Signed   By: Marcello Moores  Register   On: 04/05/2020 06:47   DG Chest Port 1  View  Result Date: 04/04/2020 CLINICAL DATA:  Postoperative EXAM: PORTABLE CHEST 1 VIEW COMPARISON:  December 18, 21. FINDINGS: Low lung volumes. Similar left greater than right opacities. No visible pleural effusions or pneumothorax. Similar cardiomediastinal silhouette, which is accentuated by low lung volumes and AP portable technique. Similar positioning of a right IJ approach CVC sheath. Gaseous distension of the stomach right axillary surgical clips. Median sternotomy. IMPRESSION: 1. Similar left greater than right opacities, favor atelectasis in the postoperative setting. 2. Gaseous distension of the stomach. Electronically Signed   By: Margaretha Sheffield MD   On: 04/04/2020 11:05    Cardiac Studies      Patient Profile     49 y.o. male   Assessment & Plan    1.  Aortic aneurism repair.   Making some progress. Has significant plugging of his left lung.    Will add nebs  Nurse is doing chest physiotherapy   2. Anxiety :  3.        For questions or updates, please contact [place] Please consult [email or web address] for contact info under        Signed, Mertie Moores, MD  04/05/2020, 8:50 AM

## 2020-04-06 ENCOUNTER — Inpatient Hospital Stay (HOSPITAL_COMMUNITY): Payer: BC Managed Care – PPO

## 2020-04-06 LAB — BASIC METABOLIC PANEL
Anion gap: 9 (ref 5–15)
BUN: 20 mg/dL (ref 6–20)
CO2: 27 mmol/L (ref 22–32)
Calcium: 8.3 mg/dL — ABNORMAL LOW (ref 8.9–10.3)
Chloride: 98 mmol/L (ref 98–111)
Creatinine, Ser: 1.1 mg/dL (ref 0.61–1.24)
GFR, Estimated: >60 mL/min (ref >60)
Glucose, Bld: 169 mg/dL — ABNORMAL HIGH (ref 70–99)
Potassium: 3.7 mmol/L (ref 3.5–5.1)
Sodium: 134 mmol/L — ABNORMAL LOW (ref 135–145)

## 2020-04-06 LAB — GLUCOSE, CAPILLARY
Glucose-Capillary: 140 mg/dL — ABNORMAL HIGH (ref 70–99)
Glucose-Capillary: 115 mg/dL — ABNORMAL HIGH (ref 70–99)
Glucose-Capillary: 134 mg/dL — ABNORMAL HIGH (ref 70–99)

## 2020-04-06 LAB — CBC
HCT: 27.5 % — ABNORMAL LOW (ref 39.0–52.0)
Hemoglobin: 9.5 g/dL — ABNORMAL LOW (ref 13.0–17.0)
MCH: 31.3 pg (ref 26.0–34.0)
MCHC: 34.5 g/dL (ref 30.0–36.0)
MCV: 90.5 fL (ref 80.0–100.0)
Platelets: 149 10*3/uL — ABNORMAL LOW (ref 150–400)
RBC: 3.04 MIL/uL — ABNORMAL LOW (ref 4.22–5.81)
RDW: 13.6 % (ref 11.5–15.5)
WBC: 7.3 10*3/uL (ref 4.0–10.5)
nRBC: 0 % (ref 0.0–0.2)

## 2020-04-06 LAB — EXPECTORATED SPUTUM ASSESSMENT W GRAM STAIN, RFLX TO RESP C

## 2020-04-06 LAB — MAGNESIUM: Magnesium: 2.3 mg/dL (ref 1.7–2.4)

## 2020-04-06 MED ORDER — GUAIFENESIN ER 600 MG PO TB12
1200.0000 mg | ORAL_TABLET | Freq: Two times a day (BID) | ORAL | Status: DC | PRN
  Administered 2020-04-06: 11:00:00 1200 mg via ORAL
  Filled 2020-04-06 (×2): qty 2

## 2020-04-06 MED ORDER — ALBUTEROL SULFATE (2.5 MG/3ML) 0.083% IN NEBU: 2.5000 mg | INHALATION_SOLUTION | RESPIRATORY_TRACT | Status: DC | PRN

## 2020-04-06 MED ORDER — ALBUTEROL SULFATE (2.5 MG/3ML) 0.083% IN NEBU
2.5000 mg | INHALATION_SOLUTION | Freq: Three times a day (TID) | RESPIRATORY_TRACT | Status: DC
  Administered 2020-04-06 – 2020-04-07 (×5): 2.5 mg via RESPIRATORY_TRACT
  Filled 2020-04-06 (×6): qty 3

## 2020-04-06 MED ORDER — ACETYLCYSTEINE 20 % IN SOLN
4.0000 mL | Freq: Three times a day (TID) | RESPIRATORY_TRACT | Status: DC
  Administered 2020-04-06 – 2020-04-07 (×4): 4 mL via RESPIRATORY_TRACT
  Filled 2020-04-06 (×11): qty 4

## 2020-04-06 MED ORDER — SIMETHICONE 80 MG PO CHEW
80.0000 mg | CHEWABLE_TABLET | Freq: Four times a day (QID) | ORAL | Status: DC | PRN
  Administered 2020-04-06 (×2): 80 mg via ORAL
  Filled 2020-04-06 (×2): qty 1

## 2020-04-06 MED ORDER — SORBITOL 70 % SOLN
30.0000 mL | Freq: Once | Status: AC
  Administered 2020-04-06: 11:00:00 30 mL via ORAL
  Filled 2020-04-06: qty 30

## 2020-04-06 MED ORDER — POTASSIUM CHLORIDE CRYS ER 20 MEQ PO TBCR
20.0000 meq | EXTENDED_RELEASE_TABLET | Freq: Two times a day (BID) | ORAL | Status: AC
  Administered 2020-04-06 (×2): 20 meq via ORAL
  Filled 2020-04-06: qty 1

## 2020-04-06 NOTE — Progress Notes (Signed)
Physical Therapy Discharge Patient Details Name: Clayton Hernandez MRN: [id number] DOB: 1971-01-22 Today's Date: 04/06/2020 Time: 4680-3212 PT Time Calculation (min) (ACUTE ONLY): 23 min  Patient discharged from PT services secondary to goals met and no further PT needs identified.  Please see latest therapy progress note for current level of functioning and progress toward goals.    Progress and discharge plan discussed with patient and/or caregiver: Patient/Caregiver agrees with plan  GP     Denice Paradise 04/06/2020, 12:04 PM  Floy Riegler W,PT Acute Rehabilitation Services Pager:  [phone]  Office:  [phone]
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
## 2020-04-06 NOTE — Progress Notes (Signed)
EVENING ROUNDS NOTE :     [address]       [place] [address]             [phone]                 5 Days Post-Op Procedure(s) (LRB): THORACIC ASCENDING ANEURYSM REPAIR (AAA) USING HEMASHIELD PLATINUM 30 MM VASCULAR SHIELD. (N/A) TRANSESOPHAGEAL ECHOCARDIOGRAM (TEE) (N/A)  Total Length of Stay:  LOS: 5 days  BP 123/85   Pulse (!) 102   Temp 98 F (36.7 C) (Oral)   Resp 16   Ht 6\' 2"  (1.88 m)   Wt 96.5 kg   SpO2 94%   BMI 27.31 kg/m   .Intake/Output      12/20 0701 12/21 0700 12/21 0701 12/22 0700   P.O. 200    I.V. (mL/kg) 3 (0)    Total Intake(mL/kg) 203 (2.1)    Urine (mL/kg/hr) 1825 (0.8) 600 (0.7)   Total Output 1825 600   Net -1622 -600        Urine Occurrence 1 x      . sodium chloride       Lab Results  Component Value Date   WBC 7.3 04/06/2020   HGB 9.5 (L) 04/06/2020   HCT 27.5 (L) 04/06/2020   PLT 149 (L) 04/06/2020   GLUCOSE 169 (H) 04/06/2020   CHOL 137 01/30/2019   TRIG 156.0 (H) 01/30/2019   HDL 39.00 (L) 01/30/2019   LDLCALC 67 01/30/2019   ALT 37 03/31/2020   AST 37 03/31/2020   NA 134 (L) 04/06/2020   K 3.7 04/06/2020   CL 98 04/06/2020   CREATININE 1.10 04/06/2020   BUN 20 04/06/2020   CO2 27 04/06/2020   INR 1.5 (H) 04/01/2020   HGBA1C 5.6 03/31/2020   Breathing better Walking in unit o2 demands decreasing I have seen and examined Wray Kearns and agree with the above assessment  and plan.  Grace Isaac MD Beeper [phone] Office [phone] 04/06/2020 3:35 PM     Grace Isaac MD  Beeper [phone] Office [phone] 04/06/2020 3:35 PM

## 2020-04-06 NOTE — Discharge Summary (Signed)
Physician Discharge Summary  Patient ID: Clayton PlattMichael J Hernandez MRN: [id number] DOB/AGE: 1970-08-16 49 y.o.  Admit date: 04/01/2020 Discharge date: 04/08/2020  Admission Diagnoses:  Discharge Diagnoses:  Active Problems:   S/P aortic aneurysm repair   Discharged Condition: good  istory of Present Illness:  49 year old gentleman presents for assessment of ascending aortic aneurysm. The patient has a history of ruptured colonic diverticulum several years ago [place]  He underwent urgent operation.  During his convalescence he had a CT scan which demonstrated an ascending aortic aneurysm measuring approximately 4.3 cm.  Patient moved here approximately a year and a half ago.  He has been followed with Dr. Antoine PocheHochrein for this aneurysm.  Looking back at the records this has been growing from the original diameter to approximately 5 cm now.  He has difficult to control high blood pressure and is on 4 different medications.  He endorses chest pain particularly with stress.  He underwent a left heart catheterization 3 years ago which was clean.  Patient has no known family history of aneurysm disease.  However, he does have a extended history of cardiovascular disease with early deaths.   Hospital Course:  Patient was admitted for elective surgery on April 01, 2020.  He was prepared and taken to the operating room where the ascending aortic aneurysm was repaired with a 30 mm Hemashield straight graft under circulatory arrest.  Following the procedure, he was rewarmed and separated from cardiopulmonary bypass without difficulty.  He was transfused with platelets and fresh frozen plasma in the operating room for next perioperative coagulopathy.  He was transferred to the cardiovascular ICU in stable condition.  He was weaned from the ventilator and extubated by 6pm on the day of surgery.   Vasoactive drips were weaned without difficulty and he maintained stable hemodynamics and  cardiac rhythm.  He was mobilized on the first postoperative day and the PA catheter and arterial lines were removed. He made excellent progress with mobility but had persistent hypoxia requiring supplemental O2.   The CXR showed bilateral lower lobe atelectasis. He was also noted to have heavy, thick respiratory secretions.  He was treated with hand held nebulizers of albuterol and mucomyst along with incentive spirometry. He was transferred to 4E Progressive Care on POD5.  His hypoxia improved quickly and he was successfully weaned from the supplemental O2. He quickly regained independence with ambulation. The pacer wires were removed on POD6 without complication.  He was ready for discharge to home on POD7.    Significant Diagnostic Studies:    ECHOCARDIOGRAM REPORT       Patient Name:  Clayton PlattMICHAEL J Hernandez Date of Exam: 03/23/2020  Medical Rec #: [id number]      Height:    74.0 in  Accession #:  [id number]     Weight:    224.0 lb  Date of Birth: 1970-08-16      BSA:     2.281 m  Patient Age:  49 years      BP:      145/83 mmHg  Patient Gender: M          HR:      56 bpm.  Exam Location: Outpatient   Procedure: 2D Echo, 3D Echo, Color Doppler, Cardiac Doppler and Strain  Analysis   Indications:  I71.2 Ascending aortic aneurysm    History:    Patient has prior history of Echocardiogram examinations,  most         recent  05/20/2016. Risk Factors:Hypertension and  Dyslipidemia.    Sonographer:  Raquel Sarna Senior RDCS  Referring Phys: [id number] [place]    1. Moderately dilated ascending aortic aneurysm with maximum diameter 49  mm. No prior study is available for comparison. A dedicated chest CTA or  MRA is recommended.  2. Left ventricular ejection fraction, by estimation, is 55 to 60%. The  left ventricle has normal function. The left ventricle has no regional  wall motion  abnormalities. There is mild concentric left ventricular  hypertrophy. Left ventricular diastolic  parameters are consistent with Grade II diastolic dysfunction  (pseudonormalization).  3. Right ventricular systolic function is normal. The right ventricular  size is normal. There is normal pulmonary artery systolic pressure.  4. Left atrial size was mildly dilated.  5. The mitral valve is normal in structure. Mild mitral valve  regurgitation. No evidence of mitral stenosis.  6. The aortic valve is normal in structure. Aortic valve regurgitation is  not visualized. No aortic stenosis is present.  7. Aortic dilatation noted. Aneurysm of the ascending aorta, measuring 49  mm. There is mild dilatation at the level of the sinuses of Valsalva,  measuring 40 mm.  8. The inferior vena cava is normal in size with greater than 50%  respiratory variability, suggesting right atrial pressure of 3 mmHg.   FINDINGS  Left Ventricle: Left ventricular ejection fraction, by estimation, is 55  to 60%. The left ventricle has normal function. The left ventricle has no  regional wall motion abnormalities. The left ventricular internal cavity  size was normal in size. There is  mild concentric left ventricular hypertrophy. Left ventricular diastolic  parameters are consistent with Grade II diastolic dysfunction  (pseudonormalization). Normal left ventricular filling pressure.   Right Ventricle: The right ventricular size is normal. No increase in  right ventricular wall thickness. Right ventricular systolic function is  normal. There is normal pulmonary artery systolic pressure.   Left Atrium: Left atrial size was mildly dilated.   Right Atrium: Right atrial size was normal in size.   Pericardium: There is no evidence of pericardial effusion.   Mitral Valve: The mitral valve is normal in structure. Mild mitral valve  regurgitation. No evidence of mitral valve stenosis.   Tricuspid Valve: The  tricuspid valve is normal in structure. Tricuspid  valve regurgitation is mild . No evidence of tricuspid stenosis.   Aortic Valve: The aortic valve is normal in structure. Aortic valve  regurgitation is not visualized. No aortic stenosis is present.   Pulmonic Valve: The pulmonic valve was normal in structure. Pulmonic valve  regurgitation is not visualized. No evidence of pulmonic stenosis.   Aorta: Aortic dilatation noted. There is mild dilatation at the level of  the sinuses of Valsalva, measuring 40 mm. There is an aneurysm involving  the ascending aorta measuring 49 mm.   Venous: The inferior vena cava is normal in size with greater than 50%  respiratory variability, suggesting right atrial pressure of 3 mmHg.   IAS/Shunts: No atrial level shunt detected by color flow Doppler.     LEFT VENTRICLE  PLAX 2D  LVIDd:     5.30 cm Diastology  LVIDs:     3.70 cm LV e' medial:  7.07 cm/s  LV PW:     1.00 cm LV E/e' medial: 7.7  LV IVS:    1.00 cm LV e' lateral:  8.81 cm/s  LVOT diam:   2.20 cm LV E/e' lateral: 6.2  LV  SV:     69  LV SV Index:  30    2D Longitudinal Strain  LVOT Area:   3.80 cm 2D Strain GLS Avg:   -17.1 %               3D Volume EF:             3D EF:    53 %             LV EDV:    204 ml             LV ESV:    95 ml             LV SV:    109 ml   RIGHT VENTRICLE  RV S prime:   8.16 cm/s  TAPSE (M-mode): 1.7 cm   LEFT ATRIUM       Index    RIGHT ATRIUM      Index  LA diam:    4.20 cm 1.84 cm/m RA Area:   15.80 cm  LA Vol (A2C):  55.6 ml 24.38 ml/m RA Volume:  38.80 ml 17.01 ml/m  LA Vol (A4C):  36.5 ml 16.00 ml/m  LA Biplane Vol: 44.8 ml 19.64 ml/m  AORTIC VALVE  LVOT Vmax:  87.05 cm/s  LVOT Vmean: 64.050 cm/s  LVOT VTI:  0.182 m    AORTA  Ao Root diam: 4.00 cm  Ao Asc diam: 4.75 cm   MITRAL  VALVE  MV Area (PHT): 1.84 cm  SHUNTS  MV Decel Time: 412 msec  Systemic VTI: 0.18 m  MV E velocity: 54.40 cm/s Systemic Diam: 2.20 cm  MV A velocity: 40.70 cm/s  MV E/A ratio: 1.34   Clayton Alexander MD  Electronically signed by Clayton Alexander MD  Signature Date/Time: 03/23/2020/12:39:35 PM       RIGHT/LEFT HEART CATH AND CORONARY ANGIOGRAPHY  THORACIC AORTOGRAM    Conclusion    The left ventricular systolic function is normal.  LV end diastolic pressure is normal.  The left ventricular ejection fraction is 55-65% by visual estimate.  There is no aortic valve stenosis.  No angiographically apparent CAD.  Dilated thoracic aorta without dissection.  Ao sat 98%, PA sat 80%, PA pressure 27/12, mean PCWP 9 mm Hg, CO 6.67; CI 2.97   Continue plans for elective aneurysm repair.        Surgeon Notes    04/02/2020 11:51 AM Op Note signed by Linden Dolin, MD    04/01/2020 1:58 PM Brief Op Note addendum by Linden Dolin, MD    04/01/2020 1:30 PM Operative Note - Scan signed by Default, Provider, MD    Indications  Thoracic aortic aneurysm without rupture (HCC) [I71.2 (ICD-10-CM)]   Procedural Details  Technical Details The risks, benefits, and details of the procedure were explained to the patient.  The patient verbalized understanding and wanted to proceed.  Informed written consent was obtained.  PROCEDURE TECHNIQUE:  After Xylocaine anesthesia, a 5 French sheath was placed in the right brachial area in exchange for a peripheral IV. A 5 French balloontipped Swan-Ganz catheter was advanced to the pulmonary artery under fluoroscopic guidance. Hemodynamic pressures were obtained. Oxygen saturations were obtained. After Xylocaine anesthesia, a 29F sheath was placed in the right radial artery with a single anterior needle wall stick.   Left heart cath and Right coronary angiography was done using a Judkins R4 guide catheter.  Left coronary angiography  was done using a Judkins L3.5  guide catheter.    Thoracic aortogram was done using a pigtail catheter and power injection.  TR band for hemostasis.     Contrast: 80 cc  Estimated blood loss <50 mL.   During this procedure medications were administered to achieve and maintain moderate conscious sedation while the patient's heart rate, blood pressure, and oxygen saturation were continuously monitored and I was present face-to-face 100% of this time.   Medications (Filter: Administrations occurring from 0728 to 0856 on 03/30/20) (important) Continuous medications are totaled by the amount administered until 03/30/20 0856.    fentaNYL (SUBLIMAZE) injection (mcg) Total dose:  100 mcg  Date/Time Rate/Dose/Volume Action   03/30/20 0741 50 mcg Given   0750 25 mcg Given   0801 25 mcg Given    midazolam (VERSED) injection (mg) Total dose:  4 mg  Date/Time Rate/Dose/Volume Action   03/30/20 0741 2 mg Given   0750 1 mg Given   0801 1 mg Given    lidocaine (PF) (XYLOCAINE) 1 % injection (mL) Total volume:  5 mL  Date/Time Rate/Dose/Volume Action   03/30/20 0756 3 mL Given   0757 2 mL Given    Radial Cocktail/Verapamil only (mL) Total volume:  10 mL  Date/Time Rate/Dose/Volume Action   03/30/20 0759 10 mL Given    heparin sodium (porcine) injection (Units) Total dose:  5,000 Units  Date/Time Rate/Dose/Volume Action   03/30/20 0806 5,000 Units Given    iohexol (OMNIPAQUE) 350 MG/ML injection (mL) Total volume:  80 mL  Date/Time Rate/Dose/Volume Action   03/30/20 0825 80 mL Given    Heparin (Porcine) in NaCl 1000-0.9 UT/500ML-% SOLN (mL) Total volume:  1,000 mL  Date/Time Rate/Dose/Volume Action   03/30/20 0806 500 mL Given   0807 500 mL Given    Sedation Time  Sedation Time Physician-1: 42 minutes 18 seconds   Contrast  Medication Name Total Dose  iohexol (OMNIPAQUE) 350 MG/ML injection 80 mL    Radiation/Fluoro  Fluoro time: 5.2 (min) DAP: 14.6  (Gycm2) Cumulative Air Kerma: 252 (mGy)   Complications   Complications documented before study signed (03/30/2020 10:12 AM)    No complications were associated with this study.  Documented by Corky Crafts, MD - 03/30/2020 8:38 AM     Coronary Findings   Diagnostic Dominance: Right  Left Main  Vessel was injected. Vessel is normal in caliber. Vessel is angiographically normal.  Left Anterior Descending  Vessel was injected. Vessel is normal in caliber. Vessel is angiographically normal.  Left Circumflex  Vessel was injected. Vessel is normal in caliber. Vessel is angiographically normal.  Right Coronary Artery  Vessel was injected. Vessel is normal in caliber. Vessel is angiographically normal.   Intervention   No interventions have been documented.  Peripheral Vascular Findings   Diagnostic   No diagnostic findings have been documented.  Intervention   No interventions have been documented.  Right Heart  Right Heart Pressures Ao sat 98%, PA sat 80%, PA pressure 27/12, mean PCWP 9 mm Hg, CO 6.67; CI 2.97   Wall Motion  Resting                Left Heart  Left Ventricle The left ventricular size is normal. The left ventricular systolic function is normal. LV end diastolic pressure is normal. The left ventricular ejection fraction is 55-65% by visual estimate. No regional wall motion abnormalities.  Aortic Valve There is no aortic valve stenosis.   Coronary Diagrams   Diagnostic Dominance: Right  Treatments:    Procedure(s): THORACIC ASCENDING ANEURYSM REPAIR (AAA) USING HEMASHIELD PLATINUM 30 MM VASCULAR SHIELD. TRANSESOPHAGEAL ECHOCARDIOGRAM (TEE) Procedure Note  Clayton Hernandez male Clayton Hernandez Procedure(s) and Anesthesia Type:    * THORACIC ASCENDING ANEURYSM REPAIR (AAA) USING HEMASHIELD PLATINUM 30 MM VASCULAR SHIELD. - General    * TRANSESOPHAGEAL ECHOCARDIOGRAM (TEE) - General  Surgeon(s) and  Role:    * Clayton Olds, MD - Primary   Indications: The patient was admitted to the hospital with a history of ascending aortic aneurysm which has enlarged over the last couple of years on serial radiographic imaging.  In addition, he has difficult to control hypertension.  He now presents for repair of ascending aortic aneurysm having been thoroughly evaluated to be a good operative candidate for this elective procedure. Surgeon: Clayton Hernandez   Assistants: Macarthur Critchley, PA-C  Anesthesia: General endotracheal anesthesia  ASA Class: 3   Discharge Exam: Blood pressure 125/72, pulse 91, temperature 98.4 F (36.9 C), temperature source Oral, resp. rate 18, height 6\' 2"  (1.88 m), weight 97.8 kg, SpO2 94 %.  General appearance: alert, cooperative and no distress Neurologic: intact Heart: regular rate and rhythm Lungs: clear to auscultation bilaterally Extremities: No peripheral edema Wound: The sternotomy incision and right chest incision are intact and dry.  Discharge disposition: 01-Home or Self Care  Discharge Instructions    Amb Referral to Cardiac Rehabilitation   Complete by: As directed    Diagnosis: Other   After initial evaluation and assessments completed: Virtual Based Care may be provided alone or in conjunction with Phase 2 Cardiac Rehab based on patient barriers.: Yes     Allergies as of 04/08/2020      Reactions   Morphine And Related Other (See Comments)   "all opiates" - "they'll kill me"   Penicillins Anaphylaxis   Toradol [ketorolac Tromethamine] Other (See Comments)   Unknown reaction (possible rash)      Medication List    STOP taking these medications   amLODipine 10 MG tablet Commonly known as: NORVASC   valsartan 160 MG tablet Commonly known as: DIOVAN     TAKE these medications   acetaminophen 325 MG tablet Commonly known as: TYLENOL Take 2 tablets (650 mg total) by mouth every 6 (six) hours as needed for mild pain.    albuterol 108 (90 Base) MCG/ACT inhaler Commonly known as: VENTOLIN HFA Inhale 2 puffs into the lungs every 6 (six) hours as needed for wheezing or shortness of breath.   aspirin 325 MG EC tablet Take 1 tablet (325 mg total) by mouth daily. Start taking on: April 09, 2020 What changed:   medication strength  how much to take  additional instructions   cetirizine 10 MG tablet Commonly known as: ZYRTEC Take 10 mg by mouth daily as needed for allergies.   cyanocobalamin 1000 MCG/ML injection Commonly known as: (VITAMIN B-12) Inject 1,000 mcg into the muscle every 30 (thirty) days.   guaiFENesin 600 MG 12 hr tablet Commonly known as: MUCINEX Take 2 tablets (1,200 mg total) by mouth every 12 (twelve) hours as needed for cough.   metoprolol succinate 25 MG 24 hr tablet Commonly known as: TOPROL-XL TAKE 1 TABLET (25 MG TOTAL) BY MOUTH DAILY. TAKE WITH OR IMMEDIATELY FOLLOWING A MEAL.   omeprazole 20 MG capsule Commonly known as: PRILOSEC TAKE 1 CAPSULE BY MOUTH EVERY DAY What changed: how much to take   rosuvastatin 20 MG tablet Commonly known as: CRESTOR Take 1  tablet (20 mg total) by mouth daily. What changed: when to take this   traMADol 50 MG tablet Commonly known as: ULTRAM Take 1 tablet (50 mg total) by mouth every 4 (four) hours as needed for moderate pain.            Durable Medical Equipment  (From admission, onward)         Start     Ordered   04/08/20 0902  For home use only DME oxygen  Once       Question Answer Comment  Length of Need 6 Months   Mode or (Route) Nasal cannula   Liters per Minute 2   Frequency Continuous (stationary and portable oxygen unit needed)   Oxygen delivery system Gas      04/08/20 0901          Follow-up Information    Linden Dolin, MD. Go on 04/12/2020.   Specialty: Cardiothoracic Surgery Why: Your appointment is at 1:30pm. Please arive 30 minutes early for a chest x-ray to be performed by [facility]  Imaging located on the first floor of the same building.  Contact information: [address] [place] [address] [phone]        Abelino Derrick, PA-C. Go on 05/07/2020.   Specialties: Cardiology, Radiology Why: Your appointment is at 9:15am.  Contact information: [address] [place] [address] [phone]              The patient has been discharged on:   1.Beta Blocker:  Yes [ x  ]                              No   [   ]                              If No, reason:  2.Ace Inhibitor/ARB: Yes [   ]                                     No  [  x  ]                                     If No, reason: BP soft, no coronary artery disease.   3.Statin:   Yes [ x  ]                  No  [  ]                  If No, reason:   4.Ecasa:  Yes  [ x  ]                  No   [   ]                  If No, reason:    Signed:   I did not provide any medical care to this patient.  I simply placed patient's discharge orders.  Makana Rostad, PA-C 04/08/2020, 10:21 AM

## 2020-04-06 NOTE — Progress Notes (Signed)
Physical Therapy Treatment and D/C Patient Details Name: Clayton Hernandez MRN: [id number] DOB: 1970-10-19 Today's Date: 04/06/2020    History of Present Illness 49 y.o. male presenting to hospital for assessment of thoracic AAA. Pt underwent thoracic AAA repair on 04/01/2020, extubated same day. PMH includes cancer, cirrhosis, diverticulits, HTN, migraines, insomnia, sleep apnea.    PT Comments    Pt admitted with above diagnosis. Pt was able to ambulate without device without LOB and good stability overall.  Able to ascend and descend steps as well. Wife present.  Pt needs continued cues for sternal precautions but is improving daily. Still needed 8LO2 to keep sats >88%.  PT goals met and pt can continue to progress with Cardiac REhab with progression of endurance. Will sign off.    Follow Up Recommendations  No PT follow up     Equipment Recommendations  None recommended by PT (? home O2)    Recommendations for Other Services       Precautions / Restrictions Precautions Precautions: Fall;Sternal Precaution Booklet Issued: No Precaution Comments: PT verbally reviews sternal precautions with pt Restrictions Other Position/Activity Restrictions: sternal precautions    Mobility  Bed Mobility Overal bed mobility: Needs Assistance Bed Mobility: Rolling;Sit to Sidelying;Sidelying to Sit Rolling: Modified independent (Device/Increase time) Sidelying to sit: Supervision       General bed mobility comments: Pt still needs cues for sternal precautions but wife gives cues.  Does not need physical assist.  Transfers Overall transfer level: Needs assistance   Transfers: Sit to/from Stand Sit to Stand: Supervision         General transfer comment: use of heart pillow, min cues for sternal precautions  Ambulation/Gait Ambulation/Gait assistance: Supervision Gait Distance (Feet): 890 Feet Assistive device: Rolling walker (2 wheeled);None Gait Pattern/deviations:  WFL(Within Functional Limits) Gait velocity: reduced Gait velocity interpretation: <1.8 ft/sec, indicate of risk for recurrent falls General Gait Details: no significant balance deviations noted. Walked first lap with RW and second lap no device without LOB.  No change in resp status without device.  Sats were 88-96% on 8LO2 with activity.  HR stable.  Pt encouraged to purse lip breathe.   Stairs Stairs: Yes Stairs assistance: Supervision Stair Management: One rail Right;One rail Left;Forwards;Alternating pattern Number of Stairs: 4 General stair comments: Pt was able to ascend and descend steps with supervision without assist and use of rail.   Wheelchair Mobility    Modified Rankin (Stroke Patients Only)       Balance Overall balance assessment: Needs assistance Sitting-balance support: No upper extremity supported;Feet supported Sitting balance-Leahy Scale: Good     Standing balance support: No upper extremity supported;During functional activity Standing balance-Leahy Scale: Good Standing balance comment: Did not need UE support for balance                            Cognition Arousal/Alertness: Awake/alert Behavior During Therapy: WFL for tasks assessed/performed Overall Cognitive Status: Within Functional Limits for tasks assessed                                        Exercises      General Comments        Pertinent Vitals/Pain Pain Assessment: Faces Faces Pain Scale: Hurts little more Pain Location: incision site Pain Descriptors / Indicators: Sore Pain Intervention(s): Limited activity within patient's tolerance;Monitored during session;Repositioned  Home Living                      Prior Function            PT Goals (current goals can now be found in the care plan section) Acute Rehab PT Goals Patient Stated Goal: to return to independence PT Goal Formulation: All assessment and education complete, DC  therapy Progress towards PT goals: Goals met/education completed, patient discharged from PT    Frequency    Min 3X/week      PT Plan Current plan remains appropriate    Co-evaluation              AM-PAC PT "6 Clicks" Mobility   Outcome Measure  Help needed turning from your back to your side while in a flat bed without using bedrails?: None Help needed moving from lying on your back to sitting on the side of a flat bed without using bedrails?: None Help needed moving to and from a bed to a chair (including a wheelchair)?: None Help needed standing up from a chair using your arms (e.g., wheelchair or bedside chair)?: A Little Help needed to walk in hospital room?: None Help needed climbing 3-5 steps with a railing? : None 6 Click Score: 23    End of Session Equipment Utilized During Treatment: Oxygen;Gait belt Activity Tolerance: Patient tolerated treatment well Patient left: with call bell/phone within reach;with family/visitor present;in chair Nurse Communication: Mobility status PT Visit Diagnosis: Other abnormalities of gait and mobility (R26.89);Muscle weakness (generalized) (M62.81)     Time: 6190-1222 PT Time Calculation (min) (ACUTE ONLY): 23 min  Charges:  $Gait Training: 23-37 mins                     [name] W,PT [place] Pager:  [phone]  Office:  [place] 04/06/2020, 12:00 PM

## 2020-04-06 NOTE — Progress Notes (Signed)
CARDIAC REHAB PHASE I   Pt seen ambulating with PT earlier today, no complaints. Returned to offer to walk with pt, pt asleep in recliner. Has not had much sleep recently. Will let rest, encouraged ambulation later today as able.  Rufina Falco, RN BSN 04/06/2020 2:37 PM

## 2020-04-06 NOTE — Discharge Instructions (Signed)

## 2020-04-07 LAB — GLUCOSE, CAPILLARY
Glucose-Capillary: 131 mg/dL — ABNORMAL HIGH (ref 70–99)
Glucose-Capillary: 128 mg/dL — ABNORMAL HIGH (ref 70–99)
Glucose-Capillary: 113 mg/dL — ABNORMAL HIGH (ref 70–99)
Glucose-Capillary: 167 mg/dL — ABNORMAL HIGH (ref 70–99)

## 2020-04-07 MED ORDER — FUROSEMIDE 40 MG PO TABS
40.0000 mg | ORAL_TABLET | Freq: Every day | ORAL | Status: DC
  Administered 2020-04-07 – 2020-04-08 (×2): 40 mg via ORAL
  Filled 2020-04-07 (×2): qty 1

## 2020-04-07 MED ORDER — POTASSIUM CHLORIDE CRYS ER 10 MEQ PO TBCR
20.0000 meq | EXTENDED_RELEASE_TABLET | Freq: Every day | ORAL | Status: DC
  Administered 2020-04-07 – 2020-04-08 (×2): 20 meq via ORAL
  Filled 2020-04-07 (×4): qty 2

## 2020-04-07 NOTE — Progress Notes (Signed)
Mobility Specialist: Progress Note   04/07/20 1614  Mobility  Activity Ambulated in hall  Level of Assistance Independent  Assistive Device None  Distance Ambulated (ft) 1300 ft  Mobility Response Tolerated well  Mobility performed by Mobility specialist  $Mobility charge 1 Mobility   Pre-Mobility on 2 L/min [place]: 96 HR,  91% SpO2 During Mobility on 2 L/min [place]: 96 HR, 91% SpO2 Post-Mobility on 2 L/min [place]: 98 HR, 93%  SpO2  Pt was asx during ambulation. Pt said he feels better when he walks and is very motivated to continue working with staff.   [facility] Macaela Presas Mobility Specialist

## 2020-04-07 NOTE — Progress Notes (Addendum)
Patient ambulated in hallway with wife on oxygen. Will monitor. Taylin Leder, Bettina Gavia RN

## 2020-04-07 NOTE — Progress Notes (Signed)
      [address]       [place] [address]             [phone]      6 Days Post-Op Procedure(s) (LRB): THORACIC ASCENDING ANEURYSM REPAIR (AAA) USING HEMASHIELD PLATINUM 30 MM VASCULAR SHIELD. (N/A) TRANSESOPHAGEAL ECHOCARDIOGRAM (TEE) (N/A) Subjective: Sitting up at the sink this morning to "get cleaned up". Feels good, no new concerns.  O2 down to 1L/[place], sats acceptable.    Objective: Vital signs in last 24 hours: Temp:  [98.4 F (36.9 C)-99 F (37.2 C)] 98.4 F (36.9 C) (12/22 0344) Pulse Rate:  [89-113] 89 (12/22 0750) Cardiac Rhythm: Normal sinus rhythm (12/22 0732) Resp:  [12-25] 16 (12/22 0750) BP: (106-133)/(67-112) 106/67 (12/22 0750) SpO2:  [90 %-97 %] 92 % (12/22 0750) Weight:  [100.2 kg] 100.2 kg (12/22 0344)      Intake/Output from previous day: 12/21 0701 - 12/22 0700 In: 510 [P.O.:510] Out: 1350 [Urine:1350] Intake/Output this shift: No intake/output data recorded.  General appearance: alert, cooperative and no distress Neurologic: intact Heart: regular rate and rhythm Lungs: clear to auscultation bilaterally Extremities: No peripheral edema Wound: The sternotomy incision and right chest incision are intact and dry.  Lab Results: Recent Labs    04/05/20 0750 04/06/20 0807  WBC 8.9 7.3  HGB 10.1* 9.5*  HCT 30.4* 27.5*  PLT 143* 149*   BMET:  Recent Labs    04/05/20 0750 04/06/20 0807  NA 133* 134*  K 3.6 3.7  CL 97* 98  CO2 25 27  GLUCOSE 193* 169*  BUN 34* 20  CREATININE 1.17 1.10  CALCIUM 8.1* 8.3*    PT/INR: No results for input(s): LABPROT, INR in the last 72 hours. ABG    Component Value Date/Time   PHART 7.330 (L) 04/01/2020 1853   HCO3 22.5 04/01/2020 1853   TCO2 24 04/01/2020 1853   ACIDBASEDEF 3.0 (H) 04/01/2020 1853   O2SAT 94.0 04/01/2020 1853   CBG (last 3)  Recent Labs    04/06/20 1614 04/06/20 2202 04/07/20 0602  GLUCAP 115* 134* 131*    Assessment/Plan: S/P Procedure(s)  (LRB): THORACIC ASCENDING ANEURYSM REPAIR (AAA) USING HEMASHIELD PLATINUM 30 MM VASCULAR SHIELD. (N/A) TRANSESOPHAGEAL ECHOCARDIOGRAM (TEE) (N/A)  -POD6 repair of ascending aortic aneurysm with a straight graft using hypothermic circulatory arrest.  Progressing well. Weaning O2. Will d/c pacer wires today. Repeat lab and CXR in AM. Possible d/c tomorrow.   -Volume excess- Wt still about 5lbs above pre-op. Oral Lasix daily.   -DVT PPX- on Lovenox.   LOS: 6 days    Antony Odea, [place] [phone] 04/07/2020

## 2020-04-07 NOTE — Progress Notes (Signed)
Patient requesting medication for anxiety. Alprazolam given as ordered as needed for anxiety. Will monitor patient. Latoya Maulding, Bettina Gavia RN

## 2020-04-07 NOTE — Progress Notes (Signed)
Patient EPW pulled per rpotocol and as ordered. All ends intact. Patient reminded to lie supine approximately one hour. bp 116/75 will monitor patient. Ninel Abdella, Bettina Gavia RN

## 2020-04-07 NOTE — Progress Notes (Signed)
CARDIAC REHAB PHASE I   PRE:  Rate/Rhythm: 84 SR    BP: sitting 121/81    SaO2: 91 4L  MODE:  Ambulation: 1580 ft   POST:  Rate/Rhythm: 100 ST    BP: sitting 123/82     SaO2: 87 2L  Pt loves to walk, makes him feel better. On 4L in bed after wires pulled. OOB standing 83 RA. Walked on 2L, SaO2 87-89 2L. No c/o, normal pace, no assist needed, long distance. Encouraged more walking, IS, flutter. Left on 2L in recliner. Will need nocturnal SaO2 tonight and another walk test tomorrow.  Discussed IS, sternal precautions, exercise, diet, and CRPII with pt and wife. Receptive. Will refer to [place] however his insurance will need to be checked for coverage. [phone]  [place], ACSM 04/07/2020 12:26 PM

## 2020-04-08 ENCOUNTER — Inpatient Hospital Stay (HOSPITAL_COMMUNITY): Payer: BC Managed Care – PPO

## 2020-04-08 LAB — BASIC METABOLIC PANEL
Anion gap: 10 (ref 5–15)
BUN: 13 mg/dL (ref 6–20)
CO2: 25 mmol/L (ref 22–32)
Calcium: 8.6 mg/dL — ABNORMAL LOW (ref 8.9–10.3)
Chloride: 99 mmol/L (ref 98–111)
Creatinine, Ser: 1.12 mg/dL (ref 0.61–1.24)
GFR, Estimated: >60 mL/min (ref >60)
Glucose, Bld: 94 mg/dL (ref 70–99)
Potassium: 4.4 mmol/L (ref 3.5–5.1)
Sodium: 134 mmol/L — ABNORMAL LOW (ref 135–145)

## 2020-04-08 LAB — CBC
HCT: 31.3 % — ABNORMAL LOW (ref 39.0–52.0)
Hemoglobin: 10.5 g/dL — ABNORMAL LOW (ref 13.0–17.0)
MCH: 30.2 pg (ref 26.0–34.0)
MCHC: 33.5 g/dL (ref 30.0–36.0)
MCV: 89.9 fL (ref 80.0–100.0)
Platelets: 188 10*3/uL (ref 150–400)
RBC: 3.48 MIL/uL — ABNORMAL LOW (ref 4.22–5.81)
RDW: 13.3 % (ref 11.5–15.5)
WBC: 7.9 10*3/uL (ref 4.0–10.5)
nRBC: 0.4 % — ABNORMAL HIGH (ref 0.0–0.2)

## 2020-04-08 LAB — GLUCOSE, CAPILLARY: Glucose-Capillary: 130 mg/dL — ABNORMAL HIGH (ref 70–99)

## 2020-04-08 MED ORDER — ACETAMINOPHEN 325 MG PO TABS: 650.0000 mg | ORAL_TABLET | Freq: Four times a day (QID) | ORAL | Status: DC | PRN

## 2020-04-08 MED ORDER — ASPIRIN 325 MG PO TBEC: 325.0000 mg | DELAYED_RELEASE_TABLET | Freq: Every day | ORAL | 0 refills | Status: DC

## 2020-04-08 MED ORDER — GUAIFENESIN ER 600 MG PO TB12: 1200.0000 mg | ORAL_TABLET | Freq: Two times a day (BID) | ORAL | Status: DC | PRN

## 2020-04-08 MED ORDER — TRAMADOL HCL 50 MG PO TABS: 50.0000 mg | ORAL_TABLET | ORAL | 0 refills | Status: DC | PRN

## 2020-04-08 NOTE — Progress Notes (Signed)
CARDIAC REHAB PHASE I    MODE:  Ambulation: >1800 ft   POST:  Rate/Rhythm: 105 ST    SaO2: 98 RA   Joined pt during his walk. Pt ambulated >1800 ft in hallway independently with steady gait. Denies pain, SOB, or dizziness. Pt sats 98 on RA upon return to room. Reinforced importance of site care, ambulation, IS use, and sternal precautions. Referred to CRP II [place].  [id number] Rufina Falco, RN BSN 04/08/2020 9:19 AM

## 2020-04-08 NOTE — Progress Notes (Signed)
PT educated and provided discharge instructions. Chest tubes sutures removed. CCMD notified/telebox removed. IV removed and intact. Vitals stable. Pt denies complaints. Pt has all belongings. Pt tx via wheelchair to valet to meet ride.  Jerald Kief, RN

## 2020-04-08 NOTE — Progress Notes (Signed)
      [address]       [place] [address]             [phone]      7 Days Post-Op Procedure(s) (LRB): THORACIC ASCENDING ANEURYSM REPAIR (AAA) USING HEMASHIELD PLATINUM 30 MM VASCULAR SHIELD. (N/A) TRANSESOPHAGEAL ECHOCARDIOGRAM (TEE) (N/A) Subjective:  Late entry, [name] was seen at 0725 this AM.  Sitting up in the chair, his wife is visiting.  Says he feels great and walked outside yesterday. Walked 1830ft this am after I saw him, sat was 98% on RA when he returned.    Objective: Vital signs in last 24 hours: Temp:  [98.4 F (36.9 C)] 98.4 F (36.9 C) (12/23 0605) Pulse Rate:  [91-96] 91 (12/23 0605) Cardiac Rhythm: Sinus tachycardia (12/23 0740) Resp:  [16-18] 18 (12/23 0605) BP: (125-127)/(72-81) 125/72 (12/23 0605) SpO2:  [90 %-95 %] 94 % (12/23 0605) Weight:  [97.8 kg] 97.8 kg (12/23 0600)    Intake/Output from previous day: 12/22 0701 - 12/23 0700 In: 1200 [P.O.:1200] Out: 1650 [Urine:1650] Intake/Output this shift: No intake/output data recorded.  General appearance: alert, cooperative and no distress Neurologic: intact Heart: regular rate and rhythm, no arrhythmias on monitor Lungs: clear to auscultation bilaterally Extremities: No peripheral edema Wound: The sternotomy incision and right chest incision are intact and dry.  Lab Results: Recent Labs    04/06/20 0807 04/08/20 0201  WBC 7.3 7.9  HGB 9.5* 10.5*  HCT 27.5* 31.3*  PLT 149* 188   BMET:  Recent Labs    04/06/20 0807 04/08/20 0201  NA 134* 134*  K 3.7 4.4  CL 98 99  CO2 27 25  GLUCOSE 169* 94  BUN 20 13  CREATININE 1.10 1.12  CALCIUM 8.3* 8.6*    PT/INR: No results for input(s): LABPROT, INR in the last 72 hours. ABG    Component Value Date/Time   PHART 7.330 (L) 04/01/2020 1853   HCO3 22.5 04/01/2020 1853   TCO2 24 04/01/2020 1853   ACIDBASEDEF 3.0 (H) 04/01/2020 1853   O2SAT 94.0 04/01/2020 1853   CBG (last 3)  Recent Labs     04/07/20 1614 04/07/20 2127 04/08/20 0620  GLUCAP 113* 167* 130*    EXAM: CHEST - 2 VIEW  COMPARISON:  April 06, 2020  FINDINGS: Small left pleural effusion with mild left base atelectasis present. Lungs elsewhere clear. Heart mildly enlarged with pulmonary vascularity normal. No adenopathy. Status post median sternotomy. No adenopathy. Surgical clips noted in right axillary region.  IMPRESSION: Small left pleural effusion with mild left base atelectasis. Lungs elsewhere clear. Stable cardiac silhouette with postoperative changes.   Electronically Signed   By: Lowella Grip III M.D.   On: 04/08/2020 08:09   Assessment/Plan: S/P Procedure(s) (LRB): THORACIC ASCENDING ANEURYSM REPAIR (AAA) USING HEMASHIELD PLATINUM 30 MM VASCULAR SHIELD. (N/A) TRANSESOPHAGEAL ECHOCARDIOGRAM (TEE) (N/A)  -POD7 repair of ascending aortic aneurysm with a straight graft using hypothermic circulatory arrest.  Progressing well. Independent with mobility and self care. Maintaining O2 sats on RA today. Labs and CXR this AM are satisfactory. Incisions are healing with no signs of complication.  Discharge to home today.     LOS: 7 days    Antony Odea, [place] [phone] 04/08/2020

## 2020-04-09 LAB — CULTURE, RESPIRATORY W GRAM STAIN: Culture: NORMAL

## 2020-04-11 ENCOUNTER — Other Ambulatory Visit: Payer: Self-pay | Admitting: Cardiology

## 2020-04-12 ENCOUNTER — Ambulatory Visit: Payer: Self-pay | Admitting: Cardiothoracic Surgery

## 2020-04-13 ENCOUNTER — Other Ambulatory Visit: Payer: Self-pay | Admitting: Cardiothoracic Surgery

## 2020-04-13 DIAGNOSIS — Z9889 Other specified postprocedural states: Secondary | ICD-10-CM

## 2020-04-14 ENCOUNTER — Ambulatory Visit (INDEPENDENT_AMBULATORY_CARE_PROVIDER_SITE_OTHER): Payer: Self-pay | Admitting: Physician Assistant

## 2020-04-14 ENCOUNTER — Other Ambulatory Visit: Payer: Self-pay

## 2020-04-14 ENCOUNTER — Ambulatory Visit
Admission: RE | Admit: 2020-04-14 | Discharge: 2020-04-14 | Disposition: A | Discharge status: Home / Self Care | Payer: PRIVATE HEALTH INSURANCE | Source: Ambulatory Visit | Attending: Cardiothoracic Surgery | Admitting: Cardiothoracic Surgery

## 2020-04-14 VITALS — BP 103/70 | HR 70 | Temp 97.6°F | Resp 20 | Ht 74.0 in | Wt 205.0 lb

## 2020-04-14 DIAGNOSIS — Z8679 Personal history of other diseases of the circulatory system: Secondary | ICD-10-CM

## 2020-04-14 DIAGNOSIS — Z9889 Other specified postprocedural states: Secondary | ICD-10-CM

## 2020-04-14 MED ORDER — OXYCODONE HCL 5 MG PO TABS: 5.0000 mg | ORAL_TABLET | Freq: Four times a day (QID) | ORAL | 0 refills | Status: DC | PRN

## 2020-04-14 MED ORDER — ALPRAZOLAM 0.25 MG PO TABS: 0.2500 mg | ORAL_TABLET | Freq: Every evening | ORAL | 0 refills | Status: DC | PRN

## 2020-04-14 NOTE — Progress Notes (Signed)
[address]       [place] [address]             [phone]      Clayton Hernandez is a 49 y.o. male patient almost two weeks out from his ascending aortic aneurysm.  Today his biggest complaint is pain and anxiety.  He has not been able to sleep since his hospitalization.   1. S/P aortic aneurysm repair    Past Medical History:  Diagnosis Date  . Ascending aortic aneurysm (HCC)    4.7 cm. 12/2018 imaging->stable. 02/2020 CT angio/aortoa slight progression->cardiol ref'd him to CT surg  . Cancer [facility])    colon cancers per doctor's notes  . Chronic renal insufficiency, stage 3 (moderate) (HCC)    GFR 55 ml/min  . Cirrhosis, nonalcoholic (HCC)    NAFLD  . Diverticulitis   . Essential hypertension   . Frequent headaches   . GERD (gastroesophageal reflux disease)   . History of colon cancer 2015   Surgery; but no chemo or rad was required.  Had a total of 5 surgeries, hx of colostomy, then an ileostomy.  [name] Hx of migraines   . Hyperlipemia, mixed    Dr. Percival Spanish d/c'd his fibrate and is considering switch/discontinuation of his statin (as of 05/2019)  . Insomnia   . Lateral epicondylitis    Recurrent, right: steroid injection by Dr. Junius Roads Sept and Nov 2020.  . Mild intermittent asthma   . Obesity, Class I, BMI 30-34.9   . Sleep apnea    No past surgical history pertinent negatives on file.   Scheduled Meds: Current Outpatient Medications on File Prior to Visit  Medication Sig Dispense Refill  . acetaminophen (TYLENOL) 325 MG tablet Take 2 tablets (650 mg total) by mouth every 6 (six) hours as needed for mild pain.    [name] albuterol (VENTOLIN HFA) 108 (90 Base) MCG/ACT inhaler Inhale 2 puffs into the lungs every 6 (six) hours as needed for wheezing or shortness of breath.    [name] aspirin EC 325 MG EC tablet Take 1 tablet (325 mg total) by mouth daily. 30 tablet 0  . cetirizine (ZYRTEC) 10 MG tablet Take 10 mg by mouth daily as needed for allergies.    .  cyanocobalamin (,VITAMIN B-12,) 1000 MCG/ML injection Inject 1,000 mcg into the muscle every 30 (thirty) days.    [name] guaiFENesin (MUCINEX) 600 MG 12 hr tablet Take 2 tablets (1,200 mg total) by mouth every 12 (twelve) hours as needed for cough.    . metoprolol succinate (TOPROL-XL) 25 MG 24 hr tablet TAKE 1 TABLET (25 MG TOTAL) BY MOUTH DAILY. TAKE WITH OR IMMEDIATELY FOLLOWING A MEAL. 90 tablet 3  . omeprazole (PRILOSEC) 20 MG capsule TAKE 1 CAPSULE BY MOUTH EVERY DAY (Patient taking differently: Take 20 mg by mouth daily.) 90 capsule 3  . rosuvastatin (CRESTOR) 20 MG tablet TAKE 1 TABLET BY MOUTH EVERY DAY 90 tablet 3  . traMADol (ULTRAM) 50 MG tablet Take 1 tablet (50 mg total) by mouth every 4 (four) hours as needed for moderate pain. 30 tablet 0   No current facility-administered medications on file prior to visit.    Allergies  Allergen Reactions  . Morphine And Related Other (See Comments)    "all opiates" - "they'll kill me"  . Penicillins Anaphylaxis  . Toradol [Ketorolac Tromethamine] Other (See Comments)    Unknown reaction (possible rash)   Active Problems:   * No active  hospital problems. *  Blood pressure 103/70, pulse 70, temperature 97.6 F (36.4 C), temperature source Skin, resp. rate 20, height 6\' 2"  (1.88 m), weight 205 lb (93 kg), SpO2 98 %.  Subjective  Patient is a 49 year old male status post ascending aortic aneurysm repair by Dr. 44.  He was released from the hospital last week on tramadol which hasn't been helping at all.  He was reluctant to ask for oxycodone on discharge but now is requesting for pain.  He also has not been able to sleep and is having a lot of anxiety about his surgery.   Objective   Cor: RRR, no murmur Pulm: CTA bilaterally and in all fields Abd: no tenderness Wound: healing well Ext: no edema  CLINICAL DATA:  Patient status post thoracic aortic aneurysm repair 04/01/2020.  EXAM: CHEST - 2 VIEW  COMPARISON:  PA and lateral  chest 04/08/2020.  FINDINGS: Median sternotomy wires are intact and unchanged in position. Small left pleural effusion basilar atelectasis persist but have improved. The right lung is clear. No pneumothorax.  IMPRESSION: Decreased small left pleural effusion and basilar atelectasis. No acute abnormality.   Electronically Signed   By: 04/10/2020 M.D.   On: 04/14/2020 13:01  Assessment & Plan   Patient is a 49 year old male who comes to our clinic today status post ascending aortic aneurysm repair with Dr. 44 on 04/01/2020.  He comes to our clinic today for his routine postoperative appointment.  His biggest complaints today are anxiety, pain, and insomnia.  He states that he has not been able to sleep at all since he left the hospital.  He has tried tramadol for pain relief without success.  His wife seconds that he should have been prescribed oxycodone on discharge.  He was receiving Oxycodone and Xanax while in the hospital and both were discontinued on discharge.  The patient had requested present time.  Overall, the patient has been ambulating once a day which is less than recommended.  I think that if we can get his pain a little bit better controlled he will be able to do more to progress in the postop period.  I have prescribed him oxycodone 5 mg tablets today to take instead of his tramadol.  I have also prescribed him a few Xanax 0.25 mg tablets to use until he can get a primary care appointment.  He probably needs to be on a longer acting medication for his anxiety such as an SSRI.  Since this is not my specialty I do not feel comfortable prescribing this medication.  He was instructed not to take his Xanax and oxycodone at the same time.  If he needs an additional assistance with sleep I suggested Benadryl.  Again, he should not take multiple sedative medications at the same time.  The patient is going to follow-up next week with Dr. 04/03/2020 to make sure that his pain is  better controlled and his anxiety has improved.  He has an appointment with cardiology which she is aware of and plans to attend.  All questions were answered to the patient and wife satisfaction.  He can call our office with further questions or concerns if any arise before next week.    Renaldo Fiddler 04/14/2020

## 2020-04-14 NOTE — Patient Instructions (Addendum)
Make every effort to stay physically active, get some type of exercise on a regular basis, and stick to a "heart healthy diet".  The long term benefits for regular exercise and a healthy diet are critically important to your overall health and wellbeing.  You are encouraged to enroll and participate in the outpatient cardiac rehab program beginning as soon as practical. 6 weeks.   Follow-up with Dr. Vickey Sages next week

## 2020-04-19 ENCOUNTER — Ambulatory Visit (INDEPENDENT_AMBULATORY_CARE_PROVIDER_SITE_OTHER): Payer: Self-pay | Admitting: Cardiothoracic Surgery

## 2020-04-19 ENCOUNTER — Other Ambulatory Visit: Payer: Self-pay

## 2020-04-19 ENCOUNTER — Encounter: Payer: Self-pay | Admitting: Cardiothoracic Surgery

## 2020-04-19 ENCOUNTER — Ambulatory Visit: Payer: PRIVATE HEALTH INSURANCE | Admitting: Cardiology

## 2020-04-19 VITALS — BP 135/79 | HR 89 | Resp 20 | Ht 74.0 in

## 2020-04-19 DIAGNOSIS — Z8679 Personal history of other diseases of the circulatory system: Secondary | ICD-10-CM

## 2020-04-19 DIAGNOSIS — Z9889 Other specified postprocedural states: Secondary | ICD-10-CM

## 2020-04-26 NOTE — Progress Notes (Signed)
The patient returns for evaluation status post repair of a sending aortic aneurysm.  He reports no difficulty with chest pain or shortness of breath.  He denies fevers or chills.  BP 135/79 (BP Location: Right Arm, Patient Position: Sitting)   Pulse 89   Resp 20   Ht 6\' 2"  (1.88 m)   SpO2 97% Comment: RA with mask on  BMI 26.32 kg/m  Well-appearing no acute distress Clear to auscultation bilaterally Regular rate and rhythm Incisions well-healed  Impression: Doing well after repair of a sending aortic aneurysm  Plan: Follow-up as needed thoracic surgery Follow-up with cardiology for blood pressure management  Gracen Ringwald Z. [name], [place]

## 2020-04-28 ENCOUNTER — Ambulatory Visit: Payer: PRIVATE HEALTH INSURANCE | Admitting: Family Medicine

## 2020-04-30 ENCOUNTER — Other Ambulatory Visit: Payer: Self-pay

## 2020-04-30 ENCOUNTER — Encounter: Payer: Self-pay | Admitting: Family Medicine

## 2020-04-30 ENCOUNTER — Ambulatory Visit (INDEPENDENT_AMBULATORY_CARE_PROVIDER_SITE_OTHER): Payer: PRIVATE HEALTH INSURANCE | Admitting: Family Medicine

## 2020-04-30 DIAGNOSIS — M25521 Pain in right elbow: Secondary | ICD-10-CM

## 2020-04-30 NOTE — Progress Notes (Signed)
Office Visit Note   Patient: Clayton Hernandez           Date of Birth: [birth year]           MRN: [id number] Visit Date: 04/30/2020 Requested by: [name], MD [address] [place],  [place] [address] PCP: [name], MD  Subjective: Chief Complaint  Patient presents with  . 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 He still has lateral pain, but this is much worse. Numbness in the ulnar aspect of the forearm and into the fingers of the right hand.     HPI: He is here with right elbow pain.  Since last visit he underwent emergency surgery for repair of thoracic aortic aneurysm.  He has had an aneurysm for many years, but this past year it suddenly change.  After the procedure, possibly due to positioning, he had pain on the medial side of his right elbow with numbness in his forearm and into the fourth and fifth fingers.  Pain has become severe, to the point that it is even worse than his postsurgical pain.  He is having a little bit of lateral elbow pain as well, but that is completely overshadowed by his medial pain.  In October we injected his radial tunnel and it helped quite a bit.  He thinks it helped more than the usual lateral epicondyle injections, but his pain on the lateral side is primarily at the lateral epicondyle again.               ROS:   All other systems were reviewed and are negative.  Objective: Vital Signs: There were no vitals taken for this visit.  Physical Exam:  General:  Alert and oriented, in no acute distress. Pulm:  Breathing unlabored. Psy:  Normal mood, congruent affect. Skin: No erythema Right elbow: Full range of motion with no effusion.  He is tender to palpation over the cubital tunnel and that seems to reproduce his medial pain.  There is a little bit of tenderness over the common flexor tendon as well, but he does not have much pain with resisted finger  flexion or wrist flexion.  Slight pain with forearm pronation against resistance.  Laterally he is point tender over the common extensor tendon.    Imaging: No results found.  Assessment & Plan: 1. right elbow pain, possibly due to stretch injury of ulnar nerve versus medial epicondylitis.  He also has some lateral epicondylitis symptoms. -Discussed options, he wants injections in both areas.  If symptoms persist, consider nerve conduction studies and possibly ultrasound imaging.     Procedures: Right elbow injection: After sterile prep with Betadine, injected 2 cc 0.25% bupivacaine and 3 mg betamethasone into the common extensor tendon and also the same quantities into the common flexor tendon without complication.       PMFS History: Patient Active Problem List   Diagnosis Date Noted  . S/P aortic aneurysm repair 04/01/2020  . Dyslipidemia 03/25/2020  . Ascending aortic aneurysm ([place]) 06/07/2019  . Essential hypertension 06/07/2019  . Educated about COVID-19 virus infection 06/07/2019   Past Medical History:  Diagnosis Date  . Ascending aortic aneurysm (HCC)    4.7 cm. 12/2018 imaging->stable. 02/2020 CT angio/aortoa slight progression->cardiol ref'd him to CT surg  . Cancer [facility])    colon cancers per doctor's notes  . Chronic renal insufficiency, stage 3 (  moderate) (HCC)    GFR 55 ml/min  . Cirrhosis, nonalcoholic (HCC)    NAFLD  . Diverticulitis   . Essential hypertension   . Frequent headaches   . GERD (gastroesophageal reflux disease)   . History of colon cancer 2015   Surgery; but no chemo or rad was required.  Had a total of 5 surgeries, hx of colostomy, then an ileostomy.  [name] Hx of migraines   . Hyperlipemia, mixed    Dr. Percival Spanish d/c'd his fibrate and is considering switch/discontinuation of his statin (as of 05/2019)  . Insomnia   . Lateral epicondylitis    Recurrent, right: steroid injection by Dr. Junius Roads Sept and Nov 2020.  . Mild intermittent asthma   .  Obesity, Class I, BMI 30-34.9   . Sleep apnea     Family History  Problem Relation Age of Onset  . Lymphoma Mother   . Cancer Father        ureter  . Stomach cancer Father   . Heart disease Father 81       Stents  . High blood pressure Father   . High Cholesterol Father   . Thyroid cancer Sister     Past Surgical History:  Procedure Laterality Date  . ABI's  03/2019   Normal  . CARDIAC CATHETERIZATION  03/30/2020   No CAD.  [name] COLON SURGERY  2015   2 cm colon ca excised.  . COLONOSCOPY  many   he has had colonoscopy q51mo since 2015.  Most recent was approx 04/2018->normal.  . COLOSTOMY  2015   diverticulitis   . ILEOSTOMY  2016  . RIGHT/LEFT HEART CATH AND CORONARY ANGIOGRAPHY N/A 03/30/2020   Procedure: RIGHT/LEFT HEART CATH AND CORONARY ANGIOGRAPHY;  Surgeon: Jettie Booze, MD;  Location: [facility];  Service: Cardiovascular;  Laterality: N/A;  . TEE WITHOUT CARDIOVERSION N/A 04/01/2020   Procedure: TRANSESOPHAGEAL ECHOCARDIOGRAM (TEE);  Surgeon: Wonda Olds, MD;  Location: [facility];  Service: Open Heart Surgery;  Laterality: N/A;  . THORACIC AORTIC ANEURYSM REPAIR N/A 04/01/2020   Procedure: THORACIC ASCENDING ANEURYSM REPAIR (AAA) USING HEMASHIELD PLATINUM 30 MM VASCULAR SHIELD.;  Surgeon: Wonda Olds, MD;  Location: [facility];  Service: Open Heart Surgery;  Laterality: N/A;  . THORACIC AORTOGRAM N/A 03/30/2020   Procedure: THORACIC AORTOGRAM;  Surgeon: Jettie Booze, MD;  Location: [facility];  Service: Cardiovascular;  Laterality: N/A;  . TRANSTHORACIC ECHOCARDIOGRAM  03/23/2020   49 mm ascend aort aneur.  EF 55-60%, grd II DD, valves normal.   Social History   Occupational History  . Not on file  Tobacco Use  . Smoking status: Former Smoker    Types: Cigarettes    Quit date: 12/31/1988    Years since quitting: 31.3  . Smokeless tobacco: Never Used  Substance and Sexual Activity  . Alcohol use: Yes    Comment: rarely  . Drug  use: Not on file  . Sexual activity: Not on file

## 2020-05-03 ENCOUNTER — Ambulatory Visit: Payer: PRIVATE HEALTH INSURANCE | Admitting: Cardiothoracic Surgery

## 2020-05-06 ENCOUNTER — Other Ambulatory Visit: Payer: Self-pay

## 2020-05-06 ENCOUNTER — Ambulatory Visit: Payer: Self-pay | Admitting: Cardiothoracic Surgery

## 2020-05-06 VITALS — BP 137/89 | HR 80 | Temp 97.8°F | Resp 20 | Ht 74.0 in | Wt 198.0 lb

## 2020-05-06 DIAGNOSIS — I712 Thoracic aortic aneurysm, without rupture, unspecified: Secondary | ICD-10-CM

## 2020-05-06 DIAGNOSIS — Z8679 Personal history of other diseases of the circulatory system: Secondary | ICD-10-CM

## 2020-05-06 DIAGNOSIS — Z9889 Other specified postprocedural states: Secondary | ICD-10-CM

## 2020-05-06 MED ORDER — IPRATROPIUM-ALBUTEROL 20-100 MCG/ACT IN AERS: 1.0000 | INHALATION_SPRAY | Freq: Four times a day (QID) | RESPIRATORY_TRACT | Status: DC

## 2020-05-06 MED ORDER — DIAZEPAM 2 MG PO TABS: 2.0000 mg | ORAL_TABLET | Freq: Three times a day (TID) | ORAL | 0 refills | Status: DC | PRN

## 2020-05-07 ENCOUNTER — Other Ambulatory Visit: Payer: Self-pay

## 2020-05-07 MED ORDER — IPRATROPIUM-ALBUTEROL 20-100 MCG/ACT IN AERS: 1.0000 | INHALATION_SPRAY | Freq: Four times a day (QID) | RESPIRATORY_TRACT | 0 refills | Status: AC

## 2020-05-07 NOTE — Progress Notes (Signed)
Patient's wife contacted office stating prescription was not called into patient's pharmacy.  RX error made, re-entered medication and sent to patient's preferred pharmacy. Patient's wife made aware via voicemail to phone number provided.

## 2020-05-10 ENCOUNTER — Ambulatory Visit (INDEPENDENT_AMBULATORY_CARE_PROVIDER_SITE_OTHER): Payer: PRIVATE HEALTH INSURANCE | Admitting: Family Medicine

## 2020-05-10 ENCOUNTER — Encounter: Payer: Self-pay | Admitting: Family Medicine

## 2020-05-10 ENCOUNTER — Other Ambulatory Visit: Payer: Self-pay

## 2020-05-10 VITALS — BP 121/79 | HR 79 | Temp 97.6°F | Resp 16 | Ht 71.75 in | Wt 199.8 lb

## 2020-05-10 DIAGNOSIS — Z0000 Encounter for general adult medical examination without abnormal findings: Secondary | ICD-10-CM

## 2020-05-10 DIAGNOSIS — E7800 Pure hypercholesterolemia, unspecified: Secondary | ICD-10-CM | POA: Diagnosis not present

## 2020-05-10 DIAGNOSIS — Z125 Encounter for screening for malignant neoplasm of prostate: Secondary | ICD-10-CM

## 2020-05-10 DIAGNOSIS — Z85038 Personal history of other malignant neoplasm of large intestine: Secondary | ICD-10-CM

## 2020-05-10 DIAGNOSIS — N1831 Chronic kidney disease, stage 3a: Secondary | ICD-10-CM

## 2020-05-10 DIAGNOSIS — I10 Essential (primary) hypertension: Secondary | ICD-10-CM | POA: Diagnosis not present

## 2020-05-10 DIAGNOSIS — K760 Fatty (change of) liver, not elsewhere classified: Secondary | ICD-10-CM | POA: Diagnosis not present

## 2020-05-10 NOTE — Patient Instructions (Signed)

## 2020-05-10 NOTE — Progress Notes (Signed)
Office Note 05/10/2020  CC:  Chief Complaint  Patient presents with  . Annual Exam    Pt is not fasting    HPI:  Clayton Hernandez is a 50 y.o. White male who is here for annual health maintenance exam and f/u HTN, HLD, CRI, NAFLD. I last saw him for his "establish care" appt back in October 2020. A/P as of that visit: "1) HTN: not ideal control.  Needs 120s/70s consistently given his hx of aortic aneurism. Will just increase amlodipine to 10mg  qd today.  No change to his toprol or valsartan. He sees cardiology in 2 d. Continue home bp monitoring. Return for fasting labs: CMET, FLP, CBC.  2) HLD: tolerating statin. FLP and hepatic panel with future fasting labs.  3) Nonalcoholic cirrhosis/NAFLD: no old records for verification/clarification available to me at this time but I don't have reason to doubt patient's report. Refer to GI for this and for general f/u of his hx of colon ca.  4) Hx of colon cancer-> refer to GI."  INTERIM HX: Feeling good.  He underwent thoracic aortic aneurism repair on 04/01/20. He feels much improved since getting this surgery. Remains only on toprol xl 25mg  qd and rosuvastatin 20mg  qd and aspirin. Followed by Dr. Percival Spanish.    Past Medical History:  Diagnosis Date  . Ascending aortic aneurysm (HCC)    4.7 cm. 12/2018 imaging->stable. 02/2020 CT angio/aortoa slight progression->cardiol ref'd him to CT surg  . Cancer [facility])    colon cancers per doctor's notes  . Chronic renal insufficiency, stage 3 (moderate) (HCC)    GFR 55 ml/min  . Cirrhosis, nonalcoholic (HCC)    NAFLD  . Diverticulitis   . Essential hypertension   . Frequent headaches   . GERD (gastroesophageal reflux disease)   . History of colon cancer 2015   Surgery; but no chemo or rad was required.  Had a total of 5 surgeries, hx of colostomy, then an ileostomy.  [name] Hx of migraines   . Hyperlipemia, mixed    Dr. Percival Spanish d/c'd his fibrate and is considering  switch/discontinuation of his statin (as of 05/2019)  . Insomnia   . Lateral epicondylitis    Recurrent, right: steroid injection by Dr. Junius Roads Sept and Nov 2020.  . Mild intermittent asthma   . Obesity, Class I, BMI 30-34.9   . Sleep apnea     Past Surgical History:  Procedure Laterality Date  . ABI's  03/2019   Normal  . CARDIAC CATHETERIZATION  03/30/2020   No CAD.  [name] COLON SURGERY  2015   2 cm colon ca excised.  . COLONOSCOPY  many   he has had colonoscopy q71mo since 2015.  Most recent was approx 04/2018->normal.  . COLOSTOMY  2015   diverticulitis   . ILEOSTOMY  2016  . RIGHT/LEFT HEART CATH AND CORONARY ANGIOGRAPHY N/A 03/30/2020   Procedure: RIGHT/LEFT HEART CATH AND CORONARY ANGIOGRAPHY;  Surgeon: Jettie Booze, MD;  Location: [place] [facility];  Service: Cardiovascular;  Laterality: N/A;  . TEE WITHOUT CARDIOVERSION N/A 04/01/2020   Procedure: TRANSESOPHAGEAL ECHOCARDIOGRAM (TEE);  Surgeon: Wonda Olds, MD;  Location: [place];  Service: Open Heart Surgery;  Laterality: N/A;  . THORACIC AORTIC ANEURYSM REPAIR N/A 04/01/2020   Procedure: THORACIC ASCENDING ANEURYSM REPAIR (AAA) USING HEMASHIELD PLATINUM 30 MM VASCULAR SHIELD.;  Surgeon: Wonda Olds, MD;  Location: [place];  Service: Open Heart Surgery;  Laterality: N/A;  . THORACIC AORTOGRAM N/A 03/30/2020   Procedure: THORACIC  AORTOGRAM;  Surgeon: Jettie Booze, MD;  Location: [place] [facility];  Service: Cardiovascular;  Laterality: N/A;  . TRANSTHORACIC ECHOCARDIOGRAM  03/23/2020   49 mm ascend aort aneur.  EF 55-60%, grd II DD, valves normal.    Family History  Problem Relation Age of Onset  . Lymphoma Mother   . Cancer Father        ureter  . Stomach cancer Father   . Heart disease Father 62       Stents  . High blood pressure Father   . High Cholesterol Father   . Thyroid cancer Sister     Social History   Socioeconomic History  . Marital status: Married    Spouse name: Not on file   . Number of children: Not on file  . Years of education: Not on file  . Highest education level: Not on file  Occupational History  . Not on file  Tobacco Use  . Smoking status: Former Smoker    Types: Cigarettes    Quit date: 12/31/1988    Years since quitting: 31.3  . Smokeless tobacco: Never Used  Substance and Sexual Activity  . Alcohol use: Yes    Comment: rarely  . Drug use: Not on file  . Sexual activity: Not on file  Other Topics Concern  . Not on file  Social History Narrative   Married, 2 daughters and 1 son.   Relocated from [place] to [place].  Born and raised in [place].  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   No T/A/Ds.   Social Determinants of Health   Financial Resource Strain: Not on file  Food Insecurity: Not on file  Transportation Needs: Not on file  Physical Activity: Not on file  Stress: Not on file  Social Connections: Not on file  Intimate Partner Violence: Not on file    Outpatient Medications Prior to Visit  Medication Sig Dispense Refill  . aspirin EC 325 MG EC tablet Take 1 tablet (325 mg total) by mouth daily. 30 tablet 0  . cyanocobalamin (,VITAMIN B-12,) 1000 MCG/ML injection Inject 1,000 mcg into the muscle every 30 (thirty) days.    . Ipratropium-Albuterol (COMBIVENT) 20-100 MCG/ACT AERS respimat Inhale 1 puff into the lungs 4 (four) times daily. 1 each 0  . metoprolol succinate (TOPROL-XL) 25 MG 24 hr tablet TAKE 1 TABLET (25 MG TOTAL) BY MOUTH DAILY. TAKE WITH OR IMMEDIATELY FOLLOWING A MEAL. 90 tablet 3  . omeprazole (PRILOSEC) 20 MG capsule TAKE 1 CAPSULE BY MOUTH EVERY DAY (Patient taking differently: Take 20 mg by mouth daily.) 90 capsule 3  . rosuvastatin (CRESTOR) 20 MG tablet TAKE 1 TABLET BY MOUTH EVERY DAY 90 tablet 3  . acetaminophen (TYLENOL) 325 MG tablet Take 2 tablets (650 mg total) by mouth every 6 (six) hours as needed for mild pain.  (Patient not taking: Reported on 05/10/2020)    . ALPRAZolam (XANAX) 0.25 MG tablet Take 1 tablet (0.25 mg total) by mouth at bedtime as needed for anxiety. (Patient not taking: Reported on 05/10/2020) 10 tablet 0  . diazepam (VALIUM) 2 MG tablet Take 1 tablet (2 mg total) by mouth every 8 (eight) hours as needed for anxiety. (Patient not taking: Reported on 05/10/2020) 30 tablet 0  . guaiFENesin (MUCINEX) 600 MG 12 hr tablet Take 2 tablets (1,200 mg total) by  mouth every 12 (twelve) hours as needed for cough. (Patient not taking: Reported on 05/10/2020)    . albuterol (VENTOLIN HFA) 108 (90 Base) MCG/ACT inhaler Inhale 2 puffs into the lungs every 6 (six) hours as needed for wheezing or shortness of breath. (Patient not taking: Reported on 05/10/2020)    . cetirizine (ZYRTEC) 10 MG tablet Take 10 mg by mouth daily as needed for allergies. (Patient not taking: Reported on 05/10/2020)    . oxyCODONE (ROXICODONE) 5 MG immediate release tablet Take 1 tablet (5 mg total) by mouth every 6 (six) hours as needed for severe pain. (Patient not taking: Reported on 05/10/2020) 30 tablet 0   No facility-administered medications prior to visit.    Allergies  Allergen Reactions  . Morphine And Related Other (See Comments)    "all opiates" - "they'll kill me"  . Penicillins Anaphylaxis  . Toradol [Ketorolac Tromethamine] Other (See Comments)    Unknown reaction (possible rash)    ROS Review of Systems  Constitutional: Negative for appetite change, chills, fatigue and fever.  HENT: Negative for congestion, dental problem, ear pain and sore throat.   Eyes: Negative for discharge, redness and visual disturbance.  Respiratory: Negative for cough, chest tightness, shortness of breath and wheezing.   Cardiovascular: Negative for chest pain, palpitations and leg swelling.  Gastrointestinal: Negative for abdominal pain, blood in stool, diarrhea, nausea and vomiting.  Genitourinary: Negative for difficulty urinating,  dysuria, flank pain, frequency, hematuria and urgency.  Musculoskeletal: Negative for arthralgias, back pain, joint swelling, myalgias and neck stiffness.  Skin: Negative for pallor and rash.  Neurological: Negative for dizziness, speech difficulty, weakness and headaches.  Hematological: Negative for adenopathy. Does not bruise/bleed easily.  Psychiatric/Behavioral: Negative for confusion and sleep disturbance. The patient is not nervous/anxious.     PE; Vitals with BMI 05/10/2020 05/06/2020 04/19/2020  Height 5' 11.75" 6\' 2"  6\' 2"   Weight 199 lbs 13 oz 198 lbs -  BMI 71.0 62.69 -  Systolic 485 462 703  Diastolic 79 89 79  Pulse 79 80 89     Gen: Alert, well appearing.  Patient is oriented to person, place, time, and situation. AFFECT: pleasant, lucid thought and speech. ENT: Ears: EACs clear, normal epithelium.  TMs with good light reflex and landmarks bilaterally.  Eyes: no injection, icteris, swelling, or exudate.  EOMI, PERRLA. Nose: no drainage or turbinate edema/swelling.  No injection or focal lesion.  Mouth: lips without lesion/swelling.  Oral mucosa pink and moist.  Dentition intact and without obvious caries or gingival swelling.  Oropharynx without erythema, exudate, or swelling.  Neck: supple/nontender.  No LAD, mass, or TM.  Carotid pulses 2+ bilaterally, without bruits. CV: RRR, no m/r/g.   LUNGS: CTA bilat, nonlabored resps, good aeration in all lung fields. ABD: soft, NT, ND, BS normal.  No hepatospenomegaly or mass.  No bruits. EXT: no clubbing, cyanosis, or edema.  Musculoskeletal: no joint swelling, erythema, warmth, or tenderness.  ROM of all joints intact. Skin - no sores or suspicious lesions or rashes or color changes   Pertinent labs:  No results found for: TSH Lab Results  Component Value Date   WBC 7.9 04/08/2020   HGB 10.5 (L) 04/08/2020   HCT 31.3 (L) 04/08/2020   MCV 89.9 04/08/2020   PLT 188 04/08/2020   Lab Results  Component Value Date    CREATININE 1.12 04/08/2020   BUN 13 04/08/2020   NA 134 (L) 04/08/2020   K 4.4 04/08/2020   CL 99  04/08/2020   CO2 25 04/08/2020   Lab Results  Component Value Date   ALT 37 03/31/2020   AST 37 03/31/2020   ALKPHOS 78 03/31/2020   BILITOT 1.3 (H) 03/31/2020   Lab Results  Component Value Date   CHOL 137 01/30/2019   Lab Results  Component Value Date   HDL 39.00 (L) 01/30/2019   Lab Results  Component Value Date   LDLCALC 67 01/30/2019   Lab Results  Component Value Date   TRIG 156.0 (H) 01/30/2019   Lab Results  Component Value Date   CHOLHDL 4 01/30/2019   Lab Results  Component Value Date   HGBA1C 5.6 03/31/2020   ASSESSMENT AND PLAN:   1) HTN: good control on toprol xl 25mg  qd. Recent lytes/cr stable 1 mo ago.  2) HLD: tolerating crestor. Cardiologist took him off all other lipid lowering meds. His cath 03/2020 showed no CAD. Return for FLP at his earliest convenience.  3) Ascending aortic aneurism: pt is s/p successful repair 6 wks ago and is feeling good.  4) NAFLD: unclear hx, but now it sounds like pt may have had some mild inc LFTs at prior PCP when he was on multiple lipid-lowering meds.  Most recent hepatic panel normal 6 wks ago.  5) Health maintenance exam: Reviewed age and gender appropriate health maintenance issues (prudent diet, regular exercise, health risks of tobacco and excessive alcohol, use of seatbelts, fire alarms in home, use of sunscreen).  Also reviewed age and gender appropriate health screening as well as vaccine recommendations. Vaccines: Covid UTD.  Flu->pt declines.  Tdap->pt defers for now. Labs: HP labs + PSA ordered for future when fasting.   Prostate ca screening: PSA ordered-future. Colon ca screening: hx colon ca 2015->will refer to GI b/c prior to [place].   An After Visit Summary was printed and given to the patient.  FOLLOW UP:  No follow-ups on file.  Signed:  Crissie Sickles, MD           05/10/2020

## 2020-05-12 ENCOUNTER — Other Ambulatory Visit: Payer: Self-pay

## 2020-05-12 ENCOUNTER — Ambulatory Visit (INDEPENDENT_AMBULATORY_CARE_PROVIDER_SITE_OTHER): Payer: PRIVATE HEALTH INSURANCE

## 2020-05-12 DIAGNOSIS — I10 Essential (primary) hypertension: Secondary | ICD-10-CM

## 2020-05-12 DIAGNOSIS — Z125 Encounter for screening for malignant neoplasm of prostate: Secondary | ICD-10-CM | POA: Diagnosis not present

## 2020-05-12 DIAGNOSIS — E7800 Pure hypercholesterolemia, unspecified: Secondary | ICD-10-CM

## 2020-05-12 DIAGNOSIS — K760 Fatty (change of) liver, not elsewhere classified: Secondary | ICD-10-CM

## 2020-05-12 DIAGNOSIS — N1831 Chronic kidney disease, stage 3a: Secondary | ICD-10-CM

## 2020-05-12 LAB — LIPID PANEL
Cholesterol: 134 mg/dL (ref 0–200)
HDL: 38 mg/dL — ABNORMAL LOW (ref >39.00)
Total CHOL/HDL Ratio: 4
Triglycerides: 522 mg/dL — ABNORMAL HIGH (ref 0.0–149.0)

## 2020-05-12 LAB — LDL CHOLESTEROL, DIRECT: Direct LDL: 61 mg/dL

## 2020-05-12 LAB — PSA: PSA: 0.54 ng/mL (ref 0.10–4.00)

## 2020-05-13 ENCOUNTER — Other Ambulatory Visit: Payer: Self-pay

## 2020-05-13 ENCOUNTER — Encounter: Payer: Self-pay | Admitting: Family Medicine

## 2020-05-13 DIAGNOSIS — E782 Mixed hyperlipidemia: Secondary | ICD-10-CM

## 2020-05-13 MED ORDER — FENOFIBRATE 145 MG PO TABS: 145.0000 mg | ORAL_TABLET | Freq: Every day | ORAL | 2 refills | Status: DC

## 2020-05-13 NOTE — Telephone Encounter (Signed)
Spoke with patient and went over results/recommendations. Rx sent for fenofibrate to [facility] and 2 month lab visit scheduled. Future labs entered as well.

## 2020-05-17 ENCOUNTER — Telehealth (HOSPITAL_COMMUNITY): Payer: Self-pay | Admitting: *Deleted

## 2020-05-17 NOTE — Telephone Encounter (Signed)
-----   Message from [name], MD sent at 05/16/2020 10:02 AM EST ----- Regarding: RE: Clayton Hernandez to proceed with Cardiac rehab prior to follow up in March He is OK to proceed with rehab.  Dr. Percival Spanish ----- Message ----- From: Rowe Pavy, RN Sent: 05/11/2020   1:28 PM EST To: [name], MD Subject: Clayton Hernandez to proceed with Cardiac rehab prior to fo#   Dr. Percival Spanish,  The above pt who is s/p aneurysm repair on 04/01/20 has completed his follow up appt with Dr. [name] on 04/19/20 and his PCP on 05/10/20 (incidentally with ortho on 1/14). Pt with improved bp within goal of 120/70's. Pt has appt with you on 3/10 this appt was made prior to his repair in December. Traditionally prior to participating in Cardiac rehab surgical patients are seen by the surgical team and cardiology.  Okay to proceed with Cardiac rehab prior to completing the follow up in March or see if your schedule would allow a sooner appt?  Thanks for your advisement Maurice Small RN, BSN Cardiac and Pulmonary Rehab Nurse Navigator

## 2020-05-24 ENCOUNTER — Telehealth: Payer: Self-pay | Admitting: Cardiology

## 2020-05-24 ENCOUNTER — Telehealth (HOSPITAL_COMMUNITY): Payer: Self-pay

## 2020-05-24 NOTE — Telephone Encounter (Signed)
Called and spoke with Jeanette Caprice at Dr. Percival Spanish office, adv her per Mar at [facility]. They requires auth for CR. Gave her the auth number [id number]), she stated she will send a message to Dr. Percival Spanish nurse.

## 2020-05-24 NOTE — Telephone Encounter (Signed)
Pt insurance is active and benefits verified through [employer] [facility]. Co-pay $0.00, DED $0.00/$0.00 met, out of pocket $1,260.00/$0.00 met, co-insurance 0%. Yes pre-authorization required. Mar P., 05/21/20 @ 345PM, [phone]  2ndary insurance is active and benefits verified through [employer]. Co-pay $0.00, DED $0.00/$0.00 met, out of pocket $2,600.00/$0.00 met, co-insurance 0%. No pre-authorization required. Len M./[employer], 05/24/20 @ 12:23PM, RQS#[id number]

## 2020-05-24 NOTE — Telephone Encounter (Signed)
Janett Billow with [name] states an authorization is needed before the patient can begin cardiac rehab.   Please contact [phone] (reference#: [id number]) to provide authorization.  If additional questions, contact Jessica at [phone].

## 2020-05-25 NOTE — Telephone Encounter (Signed)
Clayton Hernandez [place] with reference number [id number]. First call lasted 40 minutes, line disconnected when being transferred to authorization line. Second call lasted 45 minutes and call was disconnected again. Was able to find out during second call that [facility] is requesting CPT codes for cardiac rehab. Will route to billing and pre-certification to see if this can be resolved.

## 2020-05-25 NOTE — Telephone Encounter (Signed)
Call routed to prior [place] staff.

## 2020-05-29 ENCOUNTER — Other Ambulatory Visit: Payer: Self-pay | Admitting: Cardiothoracic Surgery

## 2020-06-04 NOTE — Progress Notes (Signed)
The patient presents for second outpatient visit status post replacement of ascending aorta.  He has been doing well.  He has no complaints.  Physical exam:  Incisions are well-healed.  Chest clear to auscultation. Heart regular rate and rhythm Extremities no edema  Imaging: No new studies  Impression: Doing well after replacement of ascending aorta  Plan:  Follow-up with thoracic surgery as needed Follow-up with cardiology for maintenance of antihypertensive regimen  Jeannetta Cerutti Z. [name], [place]

## 2020-06-11 ENCOUNTER — Other Ambulatory Visit: Payer: Self-pay | Admitting: Cardiology

## 2020-06-23 DIAGNOSIS — R5382 Chronic fatigue, unspecified: Secondary | ICD-10-CM | POA: Insufficient documentation

## 2020-06-23 DIAGNOSIS — R072 Precordial pain: Secondary | ICD-10-CM | POA: Insufficient documentation

## 2020-06-23 NOTE — Progress Notes (Signed)
Cardiology Office Note   Date:  06/24/2020    ID:  Clayton Hernandez, DOB 1970/08/21, MRN [id number]  PCP:  [name], MD  Cardiologist:   [name], MD Referring:  [name], MD  Chief Complaint  Patient presents with  . Palpitations      History of Present Illness: Clayton Hernandez is a 50 y.o. male who presents for follow up of a thoracic ascending aneurysm.  He moved here from [place].  He has had a complicated history but per diverticulum that required surgery.  He subsequently was found to have small colon cancers that were resected.  During all of this he was found to have an enlarged thoracic aorta.  There is CT from last 2019 and it was 4.4 cm.  There was no calcium noted in his coronaries at that time.  He had a bovine aortic arch variant.  The aorta was 4.5 cm in November of last year.   It was increased to 4.7 cm.  He had elective aortic replacement with with a 30 mm Hemashield graft.    He said he been having some increased heart rates.  His heart has been pounding away and he feels like he will the blood going through his aorta.  He says this happens about two thirds of the day.  Its not happening right now.  He is not having any new chest pressure, neck or arm discomfort.  He has had some sternal discomfort that has slowly gotten better.  He has been active on his feet.  He denies any chest pressure, neck or arm discomfort.  He has had no new shortness of breath, PND or orthopnea.  Past Medical History:  Diagnosis Date  . Ascending aortic aneurysm (HCC)    4.7 cm. 12/2018 imaging->stable. 02/2020 CT angio/aortoa slight progression->cardiol ref'd him to CT surg  . Cancer [facility])    colon cancers per doctor's notes  . Chronic renal insufficiency, stage 3 (moderate) (HCC)    GFR 55 ml/min  . Cirrhosis, nonalcoholic (HCC)    NAFLD  . Diverticulitis   . Essential hypertension   . Frequent headaches   . GERD (gastroesophageal reflux disease)    . History of colon cancer 2015   Surgery; but no chemo or rad was required.  Had a total of 5 surgeries, hx of colostomy, then an ileostomy.  [name] Hx of migraines   . Hyperlipemia, mixed    Dr. Percival Spanish d/c'd his fibrate and is considering switch/discontinuation of his statin (as of 05/2019)  . Insomnia   . Lateral epicondylitis    Recurrent, right: steroid injection by Dr. Junius Roads Sept and Nov 2020.  . Mild intermittent asthma   . Obesity, Class I, BMI 30-34.9   . Sleep apnea     Past Surgical History:  Procedure Laterality Date  . ABI's  03/2019   Normal  . CARDIAC CATHETERIZATION  03/30/2020   No CAD.  [name] COLON SURGERY  2015   2 cm colon ca excised.  . COLONOSCOPY  many   he has had colonoscopy q54mo since 2015.  Most recent was approx 04/2018->normal.  . COLOSTOMY  2015   diverticulitis   . ILEOSTOMY  2016  . RIGHT/LEFT HEART CATH AND CORONARY ANGIOGRAPHY N/A 03/30/2020   Procedure: RIGHT/LEFT HEART CATH AND CORONARY ANGIOGRAPHY;  Surgeon: Jettie Booze, MD;  Location: [place] [facility];  Service: Cardiovascular;  Laterality: N/A;  . TEE WITHOUT CARDIOVERSION N/A 04/01/2020  Procedure: TRANSESOPHAGEAL ECHOCARDIOGRAM (TEE);  Surgeon: Wonda Olds, MD;  Location: [place];  Service: Open Heart Surgery;  Laterality: N/A;  . THORACIC AORTIC ANEURYSM REPAIR N/A 04/01/2020   Procedure: THORACIC ASCENDING ANEURYSM REPAIR (AAA) USING HEMASHIELD PLATINUM 30 MM VASCULAR SHIELD.;  Surgeon: Wonda Olds, MD;  Location: [place];  Service: Open Heart Surgery;  Laterality: N/A;  . THORACIC AORTOGRAM N/A 03/30/2020   Procedure: THORACIC AORTOGRAM;  Surgeon: Jettie Booze, MD;  Location: [place] [facility];  Service: Cardiovascular;  Laterality: N/A;  . TRANSTHORACIC ECHOCARDIOGRAM  03/23/2020   49 mm ascend aort aneur.  EF 55-60%, grd II DD, valves normal.     Current Outpatient Medications  Medication Sig Dispense Refill  . aspirin EC 81 MG tablet Take 81 mg by mouth  daily. Swallow whole.    . cyanocobalamin (,VITAMIN B-12,) 1000 MCG/ML injection Inject 1,000 mcg into the muscle every 30 (thirty) days.    . fenofibrate (TRICOR) 145 MG tablet Take 1 tablet (145 mg total) by mouth daily. 30 tablet 2  . Ipratropium-Albuterol (COMBIVENT) 20-100 MCG/ACT AERS respimat Inhale 1 puff into the lungs 4 (four) times daily. 1 each 0  . omeprazole (PRILOSEC) 20 MG capsule TAKE 1 CAPSULE BY MOUTH EVERY DAY (Patient taking differently: Take 20 mg by mouth daily.) 90 capsule 3  . rosuvastatin (CRESTOR) 20 MG tablet TAKE 1 TABLET BY MOUTH EVERY DAY 90 tablet 3  . metoprolol succinate (TOPROL-XL) 50 MG 24 hr tablet Take 1 tablet (50 mg total) by mouth daily. Take with or immediately following a meal. 90 tablet 3   No current facility-administered medications for this visit.    Allergies:   Morphine and related, Penicillins, and Toradol [ketorolac tromethamine]    ROS:  Please see the history of present illness.   Otherwise, review of systems are positive for none.   All other systems are reviewed and negative.    PHYSICAL EXAM: VS:  BP 122/90 (BP Location: Left Arm, Patient Position: Sitting)   Pulse 69   Ht 6\' 2"  (1.88 m)   Wt 217 lb 3.2 oz (98.5 kg)   SpO2 98%   BMI 27.89 kg/m  , BMI Body mass index is 27.89 kg/m. GENERAL:  Well appearing NECK:  No jugular venous distention, waveform within normal limits, carotid upstroke brisk and symmetric, no bruits, no thyromegaly LUNGS:  Clear to auscultation bilaterally CHEST:  Well healed sternotomy scar. HEART:  PMI not displaced or sustained,S1 and S2 within normal limits, no S3, no S4, no clicks, no rubs, no murmurs ABD:  Flat, positive bowel sounds normal in frequency in pitch, no bruits, no rebound, no guarding, no midline pulsatile mass, no hepatomegaly, no splenomegaly EXT:  2 plus pulses throughout, no edema, no cyanosis no clubbing   EKG:  EKG is not ordered today.   Recent Labs: 03/31/2020: ALT  37 04/06/2020: Magnesium 2.3 04/08/2020: BUN 13; Creatinine, Ser 1.12; Hemoglobin 10.5; Platelets 188; Potassium 4.4; Sodium 134    Lipid Panel    Component Value Date/Time   CHOL 134 05/12/2020 0850   TRIG (H) 05/12/2020 0850    522.0 Triglyceride is over 400; calculations on Lipids are invalid.   HDL 38.00 (L) 05/12/2020 0850   CHOLHDL 4 05/12/2020 0850   VLDL 31.2 01/30/2019 1016   LDLCALC 67 01/30/2019 1016   LDLDIRECT 61.0 05/12/2020 0850      Wt Readings from Last 3 Encounters:  06/24/20 217 lb 3.2 oz (98.5 kg)  05/10/20 199  lb 12.8 oz (90.6 kg)  05/06/20 198 lb (89.8 kg)      Other studies Reviewed: Additional studies/ records that were reviewed today include: None Review of the above records demonstrates: See elsewhere   ASSESSMENT AND PLAN:  THORACIC AORTIC ANEURYSM STATUS POST REPAIR:    He is doing well status post repair.  No change in therapy.  CHEST PAIN:  He had normal coronaries on his cardiac cath.  Continue with primary risk reduction.  INSOMNIA:    He uses CPAP.  He has not been using this since surgery but now can resume.  DYSLIPIDEMIA:     LDL was 67 but his triglycerides recently were markedly elevated.  He had been off fenofibrate and this was restarted.  I will defer to McGowen, Adrian Blackwater, MD  HTN:    His blood pressure is controlled.  No change in therapy.   PALPITATIONS: I am going to increase his metoprolol to 50 mg daily.  I will send him a 3-day monitor to wear.  I will follow up after that.  Current medicines are reviewed at length with the patient today.  The patient does not have concerns regarding medicines.  The following changes have been made:  None  Labs/ tests ordered today include:  None  Orders Placed This Encounter  Procedures  . LONG TERM MONITOR (3-14 DAYS)     Disposition:   FU with me in 6 weeks.    Signed, [name], MD  06/24/2020 11:09 AM    [facility]

## 2020-06-24 ENCOUNTER — Encounter: Payer: Self-pay | Admitting: Radiology

## 2020-06-24 ENCOUNTER — Ambulatory Visit (INDEPENDENT_AMBULATORY_CARE_PROVIDER_SITE_OTHER): Payer: PRIVATE HEALTH INSURANCE

## 2020-06-24 ENCOUNTER — Encounter: Payer: Self-pay | Admitting: Cardiology

## 2020-06-24 ENCOUNTER — Other Ambulatory Visit: Payer: Self-pay

## 2020-06-24 ENCOUNTER — Ambulatory Visit (INDEPENDENT_AMBULATORY_CARE_PROVIDER_SITE_OTHER): Payer: PRIVATE HEALTH INSURANCE | Admitting: Cardiology

## 2020-06-24 VITALS — BP 122/90 | HR 69 | Ht 74.0 in | Wt 217.2 lb

## 2020-06-24 DIAGNOSIS — E785 Hyperlipidemia, unspecified: Secondary | ICD-10-CM

## 2020-06-24 DIAGNOSIS — R072 Precordial pain: Secondary | ICD-10-CM | POA: Diagnosis not present

## 2020-06-24 DIAGNOSIS — I10 Essential (primary) hypertension: Secondary | ICD-10-CM

## 2020-06-24 DIAGNOSIS — I712 Thoracic aortic aneurysm, without rupture, unspecified: Secondary | ICD-10-CM

## 2020-06-24 DIAGNOSIS — R5382 Chronic fatigue, unspecified: Secondary | ICD-10-CM

## 2020-06-24 DIAGNOSIS — R002 Palpitations: Secondary | ICD-10-CM

## 2020-06-24 MED ORDER — METOPROLOL SUCCINATE ER 50 MG PO TB24: 50.0000 mg | ORAL_TABLET | Freq: Every day | ORAL | 3 refills | Status: DC

## 2020-06-24 NOTE — Patient Instructions (Signed)
Medication Instructions:  INCREASE- Metoprolol Succinate 50 mg by mouth daily DECREASE- Aspirin 81 mg by mouth daily  *If you need a refill on your cardiac medications before your next appointment, please call your pharmacy*   Lab Work: None Ordered   Testing/Procedures: Your physician has recommended that you wear a Zio monitor for 3 days. Holter monitors are medical devices that record the heart's electrical activity. Doctors most often use these monitors to diagnose arrhythmias. Arrhythmias are problems with the speed or rhythm of the heartbeat. The monitor is a small, portable device. You can wear one while you do your normal daily activities. This is usually used to diagnose what is causing palpitations/syncope (passing out).   Follow-Up: At [facility], you and your health needs are our priority.  As part of our continuing mission to provide you with exceptional heart care, we have created designated Provider Care Teams.  These Care Teams include your primary Cardiologist (physician) and Advanced Practice Providers (APPs -  Physician Assistants and Nurse Practitioners) who all work together to provide you with the care you need, when you need it.  We recommend signing up for the patient portal called "MyChart".  Sign up information is provided on this After Visit Summary.  MyChart is used to connect with patients for Virtual Visits (Telemedicine).  Patients are able to view lab/test results, encounter notes, upcoming appointments, etc.  Non-urgent messages can be sent to your provider as well.   To learn more about what you can do with MyChart, go to [email or web address].    Your next appointment:   2 month(s)  The format for your next appointment:   In Person  Provider:   You may see [name], MD or one of the following Advanced Practice Providers on your designated Care Team:    Rosaria Ferries, PA-C  Jory Sims, DNP, ANP    Other Instructions [place] Monitor Instructions   Your physician has requested you wear your ZIO patch monitor 3 days.   This is a single patch monitor.  [employer] supplies one patch monitor per enrollment.  Additional stickers are not available.   Please do not apply patch if you will be having a Nuclear Stress Test, Echocardiogram, Cardiac CT, MRI, or Chest Xray during the time frame you would be wearing the monitor. The patch cannot be worn during these tests.  You cannot remove and re-apply the ZIO XT patch monitor.   Your ZIO patch monitor will be sent USPS Priority mail from [facility] directly to your home address. The monitor may also be mailed to a PO BOX if home delivery is not available.   It may take 3-5 days to receive your monitor after you have been enrolled.   Once you have received you monitor, please review enclosed instructions.  Your monitor has already been registered assigning a specific monitor serial # to you.   Applying the monitor   Shave hair from upper left chest.   Hold abrader disc by orange tab.  Rub abrader in 40 strokes over left upper chest as indicated in your monitor instructions.   Clean area with 4 enclosed alcohol pads .  Use all pads to assure are is cleaned thoroughly.  Let dry.   Apply patch as indicated in monitor instructions.  Patch will be place under collarbone on left side of chest with arrow pointing upward.   Rub patch adhesive wings for 2 minutes.Remove white label marked "1".  Remove white label  marked "2".  Rub patch adhesive wings for 2 additional minutes.   While looking in a mirror, press and release button in center of patch.  A small green light will flash 3-4 times .  This will be your only indicator the monitor has been turned on.     Do not shower for the first 24 hours.  You may shower after the first 24 hours.   Press button if you feel a symptom. You will hear a small click.  Record Date, Time and Symptom in the Patient Log Book.    When you are ready to remove patch, follow instructions on last 2 pages of Patient Log Book.  Stick patch monitor onto last page of Patient Log Book.   Place Patient Log Book in [place] box.  Use locking tab on box and tape box closed securely.  The [employer] has [employer] on it.  Please place in mailbox as soon as possible.  Your physician should have your test results approximately 7 days after the monitor has been mailed back to [facility].   Call [place] at [phone] if you have questions regarding your ZIO XT patch monitor.  Call them immediately if you see an orange light blinking on your monitor.   If your monitor falls off in less than 4 days contact our Monitor department at [phone].  If your monitor becomes loose or falls off after 4 days call [employer] at [phone] for suggestions on securing your monitor.

## 2020-06-24 NOTE — Progress Notes (Signed)
Enrolled patient for a 3 day Zio XT Monitor to be mailed to patients home.  °

## 2020-07-02 ENCOUNTER — Telehealth (HOSPITAL_COMMUNITY): Payer: Self-pay

## 2020-07-02 ENCOUNTER — Encounter (HOSPITAL_COMMUNITY): Payer: Self-pay

## 2020-07-02 NOTE — Telephone Encounter (Signed)
Attempted to call patient in regards to Cardiac Rehab - LM on VM Mailed letter 

## 2020-07-02 NOTE — Telephone Encounter (Signed)
Called and spoke with Delsa Sale at Dr. Percival Spanish office who stated she will send a message to the billing manager, in regards to a prior auth request. I contacted the office on 2/7 in regards to a prior auth for CR. Also I left a voicemail for the Agricultural engineer

## 2020-07-02 NOTE — Telephone Encounter (Signed)
Recv'ed message from Caren Griffins at Dr. Percival Spanish office that stated auth was obtained on 05/31/20 from [facility]. Auth good thru 8/12. Documents was not faxed to office, called [place] to have [place] faxed to [facility].  Per Campbell Lerner with [employer] auth only good for 10 visits.

## 2020-07-02 NOTE — Telephone Encounter (Signed)
[place] [id number].

## 2020-07-02 NOTE — Telephone Encounter (Signed)
Clayton Hernandez from [employer] called to provide the necessary CPT Codes.   CPT Codes: V7783916 and [phone]

## 2020-07-09 DIAGNOSIS — R002 Palpitations: Secondary | ICD-10-CM | POA: Diagnosis not present

## 2020-07-12 ENCOUNTER — Other Ambulatory Visit: Payer: Self-pay

## 2020-07-12 ENCOUNTER — Ambulatory Visit (INDEPENDENT_AMBULATORY_CARE_PROVIDER_SITE_OTHER): Payer: PRIVATE HEALTH INSURANCE

## 2020-07-12 ENCOUNTER — Encounter: Payer: Self-pay | Admitting: Family Medicine

## 2020-07-12 DIAGNOSIS — E782 Mixed hyperlipidemia: Secondary | ICD-10-CM

## 2020-07-12 LAB — AST: AST: 28 U/L (ref 0–37)

## 2020-07-12 LAB — LIPID PANEL
Cholesterol: 141 mg/dL (ref 0–200)
HDL: 38.3 mg/dL — ABNORMAL LOW (ref >39.00)
NonHDL: 103.11
Total CHOL/HDL Ratio: 4
Triglycerides: 207 mg/dL — ABNORMAL HIGH (ref 0.0–149.0)
VLDL: 41.4 mg/dL — ABNORMAL HIGH (ref 0.0–40.0)

## 2020-07-12 LAB — ALT: ALT: 21 U/L (ref 0–53)

## 2020-07-12 LAB — LDL CHOLESTEROL, DIRECT: Direct LDL: 74 mg/dL

## 2020-07-13 ENCOUNTER — Other Ambulatory Visit: Payer: Self-pay

## 2020-07-13 MED ORDER — FENOFIBRATE 145 MG PO TABS: 145.0000 mg | ORAL_TABLET | Freq: Every day | ORAL | 3 refills | Status: DC

## 2020-07-20 ENCOUNTER — Telehealth (HOSPITAL_COMMUNITY): Payer: Self-pay

## 2020-07-20 HISTORY — PX: OTHER SURGICAL HISTORY: SHX169

## 2020-07-20 NOTE — Telephone Encounter (Signed)
No response from pt.  Closed referral  

## 2020-08-03 ENCOUNTER — Encounter: Payer: Self-pay | Admitting: Family Medicine

## 2020-08-05 ENCOUNTER — Telehealth: Payer: Self-pay | Admitting: Cardiology

## 2020-08-05 NOTE — Telephone Encounter (Signed)
   [facility] Pre-operative Risk Assessment    Request for surgical clearance:  1. What type of surgery is being performed?  Unsure. Almyra Free states she is unsure what they will do today, but the patient is in their office today for an emergency appointment due to having a broken tooth   2. When is this surgery scheduled? 08/05/20  3. What type of clearance is required (medical clearance vs. Pharmacy clearance to hold med vs. Both)?  Medical. Almyra Free states [facility] would like to know if the patient needs to pre-med   4. Are there any medications that need to be held prior to surgery and how long? No    5. Practice name and name of physician performing surgery? [facility]  [place]  6. What is your office phone number? [phone]    7.   What is your office fax number? [phone]  8.   Anesthesia type (None, local, MAC, general) ? Local   Zara Council 08/05/2020, 3:20 PM  _________________________________________________________________

## 2020-08-05 NOTE — Telephone Encounter (Signed)
Spoke with Clayton Hernandez, aware per chart review the patient does not require antibiotics prior to dental procedure today.

## 2020-08-13 DIAGNOSIS — I10 Essential (primary) hypertension: Secondary | ICD-10-CM

## 2020-08-13 MED ORDER — VALSARTAN 160 MG PO TABS: 160.0000 mg | ORAL_TABLET | Freq: Every day | ORAL | 0 refills | Status: DC

## 2020-08-21 DIAGNOSIS — R002 Palpitations: Secondary | ICD-10-CM | POA: Insufficient documentation

## 2020-08-21 NOTE — Progress Notes (Deleted)
Cardiology Office Note   Date:  08/21/2020    ID:  Clayton Hernandez, DOB Apr 29, 1970, MRN [id number]  PCP:  Clayton Sou, MD  Cardiologist:   Clayton Breeding, MD Referring:  Clayton Sou, MD  No chief complaint on file.     History of Present Illness: Clayton Hernandez is a 50 y.o. male who presents for follow up of a thoracic ascending aneurysm.  He moved here from [place].  He has had a complicated history but per diverticulum that required surgery.  He subsequently was found to have small colon cancers that were resected.  During all of this he was found to have an enlarged thoracic aorta.  There is CT from last 2019 and it was 4.4 cm.  There was no calcium noted in his coronaries at that time.  He had a bovine aortic arch variant.  The aorta was 4.5 cm in November of last year.   It was increased to 4.7 cm.  He had elective aortic replacement with with a 30 mm Hemashield graft.    At the last visit I increased his beta blocker because he was having palpitations.  ***  *** He said he been having some increased heart rates.  His heart has been pounding away and he feels like he will the blood going through his aorta.  He says this happens about two thirds of the day.  Its not happening right now.  He is not having any new chest pressure, neck or arm discomfort.  He has had some sternal discomfort that has slowly gotten better.  He has been active on his feet.  He denies any chest pressure, neck or arm discomfort.  He has had no new shortness of breath, PND or orthopnea.  Past Medical History:  Diagnosis Date  . Ascending aortic aneurysm (HCC)    4.7 cm. 12/2018 imaging->stable. 02/2020 CT angio/aortoa slight progression->cardiol ref'd him to CT surg  . Cancer [facility])    colon cancers per doctor's notes  . Chronic renal insufficiency, stage 3 (moderate) (HCC)    GFR 55 ml/min  . Cirrhosis, nonalcoholic (HCC)    NAFLD  . Diverticulitis   . Essential hypertension   .  Frequent headaches   . GERD (gastroesophageal reflux disease)   . History of colon cancer 2015   Surgery; but no chemo or rad was required.  Had a total of 5 surgeries, hx of colostomy, then an ileostomy.  [name] Hx of migraines   . Hyperlipemia, mixed    Dr. Percival Spanish d/c'd his fibrate and is considering switch/discontinuation of his statin (as of 05/2019)  . Insomnia   . Lateral epicondylitis    Recurrent, right: steroid injection by Dr. Junius Roads Sept and Nov 2020.  . Mild intermittent asthma   . Obesity, Class I, BMI 30-34.9   . Sleep apnea     Past Surgical History:  Procedure Laterality Date  . ABI's  03/2019   Normal  . CARDIAC CATHETERIZATION  03/30/2020   No CAD.  [name] Cardiac rhythm monitoring  07/20/2020   ZIO patch x 3d-->no arrhythmias  . COLON SURGERY  2015   2 cm colon ca excised.  . COLONOSCOPY  many   he has had colonoscopy q37mo since 2015.  Most recent was approx 04/2018->normal.  . COLOSTOMY  2015   diverticulitis   . ILEOSTOMY  2016  . RIGHT/LEFT HEART CATH AND CORONARY ANGIOGRAPHY N/A 03/30/2020   Procedure: RIGHT/LEFT HEART CATH AND CORONARY  ANGIOGRAPHY;  Surgeon: Jettie Booze, MD;  Location: [place] [facility];  Service: Cardiovascular;  Laterality: N/A;  . TEE WITHOUT CARDIOVERSION N/A 04/01/2020   Procedure: TRANSESOPHAGEAL ECHOCARDIOGRAM (TEE);  Surgeon: Wonda Olds, MD;  Location: [place];  Service: Open Heart Surgery;  Laterality: N/A;  . THORACIC AORTIC ANEURYSM REPAIR N/A 04/01/2020   Procedure: THORACIC ASCENDING ANEURYSM REPAIR (AAA) USING HEMASHIELD PLATINUM 30 MM VASCULAR SHIELD.;  Surgeon: Wonda Olds, MD;  Location: [place];  Service: Open Heart Surgery;  Laterality: N/A;  . THORACIC AORTOGRAM N/A 03/30/2020   Procedure: THORACIC AORTOGRAM;  Surgeon: Jettie Booze, MD;  Location: [place] [facility];  Service: Cardiovascular;  Laterality: N/A;  . TRANSTHORACIC ECHOCARDIOGRAM  03/23/2020   49 mm ascend aort aneur.  EF 55-60%, grd II DD,  valves normal.     Current Outpatient Medications  Medication Sig Dispense Refill  . aspirin EC 81 MG tablet Take 81 mg by mouth daily. Swallow whole.    . cyanocobalamin (,VITAMIN B-12,) 1000 MCG/ML injection Inject 1,000 mcg into the muscle every 30 (thirty) days.    . fenofibrate (TRICOR) 145 MG tablet Take 1 tablet (145 mg total) by mouth daily. 90 tablet 3  . Ipratropium-Albuterol (COMBIVENT) 20-100 MCG/ACT AERS respimat Inhale 1 puff into the lungs 4 (four) times daily. 1 each 0  . metoprolol succinate (TOPROL-XL) 50 MG 24 hr tablet Take 1 tablet (50 mg total) by mouth daily. Take with or immediately following a meal. 90 tablet 3  . omeprazole (PRILOSEC) 20 MG capsule TAKE 1 CAPSULE BY MOUTH EVERY DAY (Patient taking differently: Take 20 mg by mouth daily.) 90 capsule 3  . rosuvastatin (CRESTOR) 20 MG tablet TAKE 1 TABLET BY MOUTH EVERY DAY 90 tablet 3  . valsartan (DIOVAN) 160 MG tablet Take 1 tablet (160 mg total) by mouth daily. 90 tablet 0   No current facility-administered medications for this visit.    Allergies:   Morphine and related, Penicillins, and Toradol [ketorolac tromethamine]    ROS:  Please see the history of present illness.   Otherwise, review of systems are positive for ***.   All other systems are reviewed and negative.    PHYSICAL EXAM: VS:  There were no vitals taken for this visit. , BMI There is no height or weight on file to calculate BMI. GENERAL:  Well appearing NECK:  No jugular venous distention, waveform within normal limits, carotid upstroke brisk and symmetric, no bruits, no thyromegaly LUNGS:  Clear to auscultation bilaterally CHEST:  Unremarkable HEART:  PMI not displaced or sustained,S1 and S2 within normal limits, no S3, no S4, no clicks, no rubs, *** murmurs ABD:  Flat, positive bowel sounds normal in frequency in pitch, no bruits, no rebound, no guarding, no midline pulsatile mass, no hepatomegaly, no splenomegaly EXT:  2 plus pulses  throughout, no edema, no cyanosis no clubbing     ***GENERAL:  Well appearing NECK:  No jugular venous distention, waveform within normal limits, carotid upstroke brisk and symmetric, no bruits, no thyromegaly LUNGS:  Clear to auscultation bilaterally CHEST:  Well healed sternotomy scar. HEART:  PMI not displaced or sustained,S1 and S2 within normal limits, no S3, no S4, no clicks, no rubs, no murmurs ABD:  Flat, positive bowel sounds normal in frequency in pitch, no bruits, no rebound, no guarding, no midline pulsatile mass, no hepatomegaly, no splenomegaly EXT:  2 plus pulses throughout, no edema, no cyanosis no clubbing   EKG:  EKG is ***  ordered today. ***  Recent Labs: 04/06/2020: Magnesium 2.3 04/08/2020: BUN 13; Creatinine, Ser 1.12; Hemoglobin 10.5; Platelets 188; Potassium 4.4; Sodium 134 07/12/2020: ALT 21    Lipid Panel    Component Value Date/Time   CHOL 141 07/12/2020 0844   TRIG 207.0 (H) 07/12/2020 0844   HDL 38.30 (L) 07/12/2020 0844   CHOLHDL 4 07/12/2020 0844   VLDL 41.4 (H) 07/12/2020 0844   LDLCALC 67 01/30/2019 1016   LDLDIRECT 74.0 07/12/2020 0844      Wt Readings from Last 3 Encounters:  06/24/20 217 lb 3.2 oz (98.5 kg)  05/10/20 199 lb 12.8 oz (90.6 kg)  05/06/20 198 lb (89.8 kg)      Other studies Reviewed: Additional studies/ records that were reviewed today include: *** Review of the above records demonstrates: See elsewhere   ASSESSMENT AND PLAN:  THORACIC AORTIC ANEURYSM STATUS POST REPAIR:    ***  He is doing well status post repair.  No change in therapy.  INSOMNIA:     *** He uses CPAP.  He has not been using this since surgery but now can resume.  DYSLIPIDEMIA:    ***   LDL was 67 but his triglycerides recently were markedly elevated.  He had been off fenofibrate and this was restarted.  I will defer to McGowen, Adrian Blackwater, MD  HTN:    ***  His blood pressure is controlled.  No change in therapy.   PALPITATIONS:   ***  I am going  to increase his metoprolol to 50 mg daily.  I will send him a 3-day monitor to wear.  I will follow up after that.  Current medicines are reviewed at length with the patient today.  The patient does not have concerns regarding medicines.  The following changes have been made:  ***  Labs/ tests ordered today include:   ***  No orders of the defined types were placed in this encounter.    Disposition:   FU with me in ***  Signed, Clayton Breeding, MD  08/21/2020 7:34 PM    [place]

## 2020-08-23 ENCOUNTER — Ambulatory Visit: Payer: PRIVATE HEALTH INSURANCE | Admitting: Cardiology

## 2020-08-24 ENCOUNTER — Ambulatory Visit: Payer: PRIVATE HEALTH INSURANCE | Admitting: Internal Medicine

## 2020-12-10 ENCOUNTER — Ambulatory Visit: Payer: BC Managed Care – PPO | Admitting: Cardiology

## 2021-03-13 NOTE — Progress Notes (Signed)
Cardiology Office Note   Date:  03/15/2021    ID:  Clayton Hernandez, DOB 1970/09/24, MRN [id number]  PCP:  [name], MD  Cardiologist:   [name], MD Referring:  [name], MD    No chief complaint on file.     History of Present Illness: Clayton Hernandez is a 50 y.o. male who presents for follow up of a thoracic ascending aneurysm.  He moved here from [place].  He has had a complicated history but per diverticulum that required surgery.  He subsequently was found to have small colon cancers that were resected.  During all of this he was found to have an enlarged thoracic aorta.  There is CT from last 2019 and it was 4.4 cm.  There was no calcium noted in his coronaries at that time.  He had a bovine aortic arch variant.  The aorta was 4.5 cm in November of last year.   It was increased to 4.7 cm.  He had elective aortic replacement with with a 30 mm Hemashield graft.    He says he is doing very well.  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 The patient denies any new symptoms such as chest discomfort, neck or arm discomfort. There has been no new shortness of breath, PND or orthopnea. There have been no reported palpitations, presyncope or syncope.    Past Medical History:  Diagnosis Date   Ascending aortic aneurysm    4.7 cm. 12/2018 imaging->stable. 02/2020 CT angio/aortoa slight progression->cardiol ref'd him to CT surg   Cancer [facility])    colon cancers per doctor's notes   Chronic renal insufficiency, stage 3 (moderate) (HCC)    GFR 55 ml/min   Cirrhosis, nonalcoholic (HCC)    NAFLD   Diverticulitis    Essential hypertension    Frequent headaches    GERD (gastroesophageal reflux disease)    History of colon cancer 2015   Surgery; but no chemo or rad was required.  Had a total of 5 surgeries, hx of colostomy, then an ileostomy.   Hx of migraines    Hyperlipemia, mixed    Dr. Percival Spanish d/c'd his fibrate and is  considering switch/discontinuation of his statin (as of 05/2019)   Insomnia    Lateral epicondylitis    Recurrent, right: steroid injection by Dr. Junius Roads Sept and Nov 2020.   Mild intermittent asthma    Obesity, Class I, BMI 30-34.9    Sleep apnea     Past Surgical History:  Procedure Laterality Date   ABI's  03/2019   Normal   CARDIAC CATHETERIZATION  03/30/2020   No CAD.   Cardiac rhythm monitoring  07/20/2020   ZIO patch x 3d-->no arrhythmias   COLON SURGERY  2015   2 cm colon ca excised.   COLONOSCOPY  many   he has had colonoscopy q37mo since 2015.  Most recent was approx 04/2018->normal.   COLOSTOMY  2015   diverticulitis    ILEOSTOMY  2016   RIGHT/LEFT HEART CATH AND CORONARY ANGIOGRAPHY N/A 03/30/2020   Procedure: RIGHT/LEFT HEART CATH AND CORONARY ANGIOGRAPHY;  Surgeon: Jettie Booze, MD;  Location: [place] [facility];  Service: Cardiovascular;  Laterality: N/A;   TEE WITHOUT CARDIOVERSION N/A 04/01/2020   Procedure: TRANSESOPHAGEAL ECHOCARDIOGRAM (TEE);  Surgeon: Wonda Olds, MD;  Location: [place];  Service: Open Heart Surgery;  Laterality: N/A;   THORACIC AORTIC ANEURYSM REPAIR N/A 04/01/2020  Procedure: THORACIC ASCENDING ANEURYSM REPAIR (AAA) USING HEMASHIELD PLATINUM 30 MM VASCULAR SHIELD.;  Surgeon: Wonda Olds, MD;  Location: [place];  Service: Open Heart Surgery;  Laterality: N/A;   THORACIC AORTOGRAM N/A 03/30/2020   Procedure: THORACIC AORTOGRAM;  Surgeon: Jettie Booze, MD;  Location: [place] [facility];  Service: Cardiovascular;  Laterality: N/A;   TRANSTHORACIC ECHOCARDIOGRAM  03/23/2020   49 mm ascend aort aneur.  EF 55-60%, grd II DD, valves normal.     Current Outpatient Medications  Medication Sig Dispense Refill   cyanocobalamin (,VITAMIN B-12,) 1000 MCG/ML injection Inject 1,000 mcg into the muscle every 30 (thirty) days.     fenofibrate (TRICOR) 145 MG tablet Take 1 tablet (145 mg total) by mouth daily. 90 tablet 3    Ipratropium-Albuterol (COMBIVENT) 20-100 MCG/ACT AERS respimat Inhale 1 puff into the lungs 4 (four) times daily. 1 each 0   metoprolol succinate (TOPROL-XL) 50 MG 24 hr tablet Take 1 tablet (50 mg total) by mouth daily. Take with or immediately following a meal. 90 tablet 3   omeprazole (PRILOSEC) 20 MG capsule TAKE 1 CAPSULE BY MOUTH EVERY DAY (Patient taking differently: Take 20 mg by mouth daily.) 90 capsule 3   rosuvastatin (CRESTOR) 20 MG tablet TAKE 1 TABLET BY MOUTH EVERY DAY 90 tablet 3   valsartan (DIOVAN) 160 MG tablet Take 1 tablet (160 mg total) by mouth daily. 90 tablet 0   aspirin EC 81 MG tablet Take 81 mg by mouth daily. Swallow whole.     No current facility-administered medications for this visit.    Allergies:   Morphine and related, Penicillins, and Toradol [ketorolac tromethamine]    ROS:  Please see the history of present illness.   Otherwise, review of systems are positive for none.   All other systems are reviewed and negative.    PHYSICAL EXAM: VS:  BP 120/82   Pulse 61   Ht 6\' 2"  (1.88 m)   Wt 226 lb 3.2 oz (102.6 kg)   SpO2 99%   BMI 29.04 kg/m  , BMI Body mass index is 29.04 kg/m. GENERAL:  Well appearing NECK:  No jugular venous distention, waveform within normal limits, carotid upstroke brisk and symmetric, no bruits, no thyromegaly LUNGS:  Clear to auscultation bilaterally CHEST:  Well healed sternotomy scar. HEART:  PMI not displaced or sustained,S1 and S2 within normal limits, no S3, no S4, no clicks, no rubs, no murmurs ABD:  Flat, positive bowel sounds normal in frequency in pitch, no bruits, no rebound, no guarding, no midline pulsatile mass, no hepatomegaly, no splenomegaly EXT:  2 plus pulses throughout, no edema, no cyanosis no clubbing   EKG:  EKG is  ordered today. Sinus rhythm, rate 61, axis within normal limits, intervals within normal limits, no acute ST-T wave changes.  Recent Labs: 04/06/2020: Magnesium 2.3 04/08/2020: BUN 13;  Creatinine, Ser 1.12; Hemoglobin 10.5; Platelets 188; Potassium 4.4; Sodium 134 07/12/2020: ALT 21    Lipid Panel    Component Value Date/Time   CHOL 141 07/12/2020 0844   TRIG 207.0 (H) 07/12/2020 0844   HDL 38.30 (L) 07/12/2020 0844   CHOLHDL 4 07/12/2020 0844   VLDL 41.4 (H) 07/12/2020 0844   LDLCALC 67 01/30/2019 1016   LDLDIRECT 74.0 07/12/2020 0844      Wt Readings from Last 3 Encounters:  03/15/21 226 lb 3.2 oz (102.6 kg)  06/24/20 217 lb 3.2 oz (98.5 kg)  05/10/20 199 lb 12.8 oz (90.6 kg)  Other studies Reviewed: Additional studies/ records that were reviewed today include: Labs Review of the above records demonstrates:    See elsewhere   ASSESSMENT AND PLAN:  THORACIC AORTIC ANEURYSM STATUS POST REPAIR:    He is doing very well status post repair.  No further imaging at this point.   SLEEP APNEA:    He uses CPAP.    DYSLIPIDEMIA:     LDL was 67.  I will repeat this as his triglycerides have been mildly elevated.   HTN:    His blood pressure is well controlled.  No change in therapy.   PALPITATIONS:   He is no longer experiencing these.  No change in therapy.  Current medicines are reviewed at length with the patient today.  The patient does not have concerns regarding medicines.  The following changes have been made: None  Labs/ tests ordered today include: As above  No orders of the defined types were placed in this encounter.    Disposition:   FU with me in 1 year   Signed, [name], MD  03/15/2021 2:11 PM    [facility]

## 2021-03-15 ENCOUNTER — Other Ambulatory Visit: Payer: Self-pay

## 2021-03-15 ENCOUNTER — Ambulatory Visit (INDEPENDENT_AMBULATORY_CARE_PROVIDER_SITE_OTHER): Payer: BC Managed Care – PPO | Admitting: Cardiology

## 2021-03-15 ENCOUNTER — Encounter: Payer: Self-pay | Admitting: Cardiology

## 2021-03-15 VITALS — BP 120/82 | HR 61 | Ht 74.0 in | Wt 226.2 lb

## 2021-03-15 DIAGNOSIS — I7121 Aneurysm of the ascending aorta, without rupture: Secondary | ICD-10-CM | POA: Diagnosis not present

## 2021-03-15 DIAGNOSIS — E785 Hyperlipidemia, unspecified: Secondary | ICD-10-CM | POA: Diagnosis not present

## 2021-03-15 DIAGNOSIS — G4730 Sleep apnea, unspecified: Secondary | ICD-10-CM

## 2021-03-15 DIAGNOSIS — R002 Palpitations: Secondary | ICD-10-CM | POA: Diagnosis not present

## 2021-03-15 DIAGNOSIS — I10 Essential (primary) hypertension: Secondary | ICD-10-CM

## 2021-03-15 MED ORDER — VALSARTAN 160 MG PO TABS: 160.0000 mg | ORAL_TABLET | Freq: Every day | ORAL | 3 refills | Status: DC

## 2021-03-15 MED ORDER — FENOFIBRATE 145 MG PO TABS: 145.0000 mg | ORAL_TABLET | Freq: Every day | ORAL | 3 refills | Status: DC

## 2021-03-15 MED ORDER — METOPROLOL SUCCINATE ER 50 MG PO TB24: 50.0000 mg | ORAL_TABLET | Freq: Every day | ORAL | 3 refills | Status: DC

## 2021-03-15 MED ORDER — ROSUVASTATIN CALCIUM 20 MG PO TABS: 20.0000 mg | ORAL_TABLET | Freq: Every day | ORAL | 3 refills | Status: DC

## 2021-03-15 NOTE — Patient Instructions (Signed)
  Lab Work:  Your physician recommends that you return for lab work FASTING  If you have labs (blood work) drawn today and your tests are completely normal, you will receive your results only by: [place] (if you have [place]) OR A paper copy in the mail If you have any lab test that is abnormal or we need to change your treatment, we will call you to review the results.   Follow-Up: At [facility], you and your health needs are our priority.  As part of our continuing mission to provide you with exceptional heart care, we have created designated Provider Care Teams.  These Care Teams include your primary Cardiologist (physician) and Advanced Practice Providers (APPs -  Physician Assistants and Nurse Practitioners) who all work together to provide you with the care you need, when you need it.  We recommend signing up for the patient portal called "MyChart".  Sign up information is provided on this After Visit Summary.  MyChart is used to connect with patients for Virtual Visits (Telemedicine).  Patients are able to view lab/test results, encounter notes, upcoming appointments, etc.  Non-urgent messages can be sent to your provider as well.   To learn more about what you can do with MyChart, go to [email or web address].    Your next appointment:   12 month(s)  The format for your next appointment:   In Person  Provider:   [name], MD

## 2021-03-17 ENCOUNTER — Other Ambulatory Visit: Payer: Self-pay

## 2021-03-17 MED ORDER — OMEPRAZOLE 20 MG PO CPDR
20.0000 mg | DELAYED_RELEASE_CAPSULE | Freq: Every day | ORAL | 3 refills | Status: DC
Start: 2021-03-17 — End: 2021-03-17

## 2021-03-17 MED ORDER — OMEPRAZOLE 20 MG PO CPDR: 20.0000 mg | DELAYED_RELEASE_CAPSULE | Freq: Every day | ORAL | 3 refills | Status: DC

## 2021-03-17 MED ORDER — VALSARTAN 160 MG PO TABS: 160.0000 mg | ORAL_TABLET | Freq: Every day | ORAL | 3 refills | Status: DC

## 2021-03-18 LAB — CBC
Hematocrit: 50.7 % (ref 37.5–51.0)
Hemoglobin: 17.1 g/dL (ref 13.0–17.7)
MCH: 30.3 pg (ref 26.6–33.0)
MCHC: 33.7 g/dL (ref 31.5–35.7)
MCV: 90 fL (ref 79–97)
Platelets: 141 10*3/uL — ABNORMAL LOW (ref 150–450)
RBC: 5.65 x10E6/uL (ref 4.14–5.80)
RDW: 14.8 % (ref 11.6–15.4)
WBC: 7.8 10*3/uL (ref 3.4–10.8)

## 2021-03-18 LAB — LIPID PANEL
Chol/HDL Ratio: 3.9 ratio (ref 0.0–5.0)
Cholesterol, Total: 145 mg/dL (ref 100–199)
HDL: 37 mg/dL — ABNORMAL LOW (ref >39)
LDL Chol Calc (NIH): 67 mg/dL (ref 0–99)
Triglycerides: 252 mg/dL — ABNORMAL HIGH (ref 0–149)
VLDL Cholesterol Cal: 41 mg/dL — ABNORMAL HIGH (ref 5–40)

## 2021-03-18 LAB — COMPREHENSIVE METABOLIC PANEL
ALT: 16 IU/L (ref 0–44)
AST: 19 IU/L (ref 0–40)
Albumin/Globulin Ratio: 1.7 (ref 1.2–2.2)
Albumin: 4.5 g/dL (ref 3.8–4.9)
Alkaline Phosphatase: 76 IU/L (ref 44–121)
BUN/Creatinine Ratio: 20 (ref 9–20)
BUN: 26 mg/dL — ABNORMAL HIGH (ref 6–24)
Bilirubin Total: 0.4 mg/dL (ref 0.0–1.2)
CO2: 21 mmol/L (ref 20–29)
Calcium: 9.3 mg/dL (ref 8.7–10.2)
Chloride: 104 mmol/L (ref 96–106)
Creatinine, Ser: 1.29 mg/dL — ABNORMAL HIGH (ref 0.76–1.27)
Globulin, Total: 2.6 g/dL (ref 1.5–4.5)
Glucose: 74 mg/dL (ref 70–99)
Potassium: 3.7 mmol/L (ref 3.5–5.2)
Sodium: 140 mmol/L (ref 134–144)
Total Protein: 7.1 g/dL (ref 6.0–8.5)
eGFR: 67 mL/min/{1.73_m2} (ref >59)

## 2021-03-21 ENCOUNTER — Encounter: Payer: Self-pay | Admitting: Cardiology

## 2021-10-07 ENCOUNTER — Encounter: Payer: Self-pay | Admitting: Neurology

## 2022-03-06 NOTE — Progress Notes (Deleted)
NEUROLOGY CONSULTATION NOTE  Clayton Hernandez MRN: [id number] DOB: [birth year]  Referring provider: Antonietta Jewel, MD Primary care provider: Shawnie Dapper, MD  Reason for consult:  headache  Assessment/Plan:   ***   Subjective:  Clayton Hernandez is a 51 year old ***-handed male with CKD stage 3, nonalcoholic cirrhosis, abdominal aortic aneurysm, HTN, HLD, sleep apnea, cataracts, floppy eyelid syndrome and history of colon cancer s/p surgery who presents for headaches.  History supplemented by referring provider's note.  Onset:  *** Location:  *** Quality:  *** Intensity:  ***.  *** denies new headache, thunderclap headache or severe headache that wakes *** from sleep. Aura:  *** Prodrome:  *** Postdrome:  *** Associated symptoms:  ***.  *** denies associated unilateral numbness or weakness. Duration:  *** Frequency:  *** Frequency of abortive medication: *** Triggers:  *** Relieving factors:  *** Activity:  ***  Past NSAIDS/analgesics:  ASA Past abortive triptans:  *** Past abortive ergotamine:  none Past muscle relaxants:  none Past anti-emetic:  none Past antihypertensive medications:  amlodipine Past antidepressant medications:  *** Past anticonvulsant medications:  *** Past anti-CGRP:  none Past antihistamines/decongestants:  Zyrtec Other past therapies:  ***  Current NSAIDS/analgesics:  *** Current triptans:  none Current ergotamine:  none Current anti-emetic:  none Current muscle relaxants:  none Current Antihypertensive medications:  metoprolol succinate, valsartan Current Antidepressant medications:  none Current Anticonvulsant medications:  none Current anti-CGRP:  none Current Vitamins/Herbal/Supplements:  B12,  Current Antihistamines/Decongestants:  none Other therapy:  ***   Caffeine:  *** Alcohol:  *** Smoker:  *** Diet:  *** Exercise:  *** Depression:  ***; Anxiety:  *** Other pain:  *** Sleep hygiene:  *** Family history of  headache:  ***      PAST MEDICAL HISTORY: Past Medical History:  Diagnosis Date   Ascending aortic aneurysm    4.7 cm. 12/2018 imaging->stable. 02/2020 CT angio/aortoa slight progression->cardiol ref'd him to CT surg   Cancer [facility])    colon cancers per doctor's notes   Chronic renal insufficiency, stage 3 (moderate) (HCC)    GFR 55 ml/min   Cirrhosis, nonalcoholic (HCC)    NAFLD   Diverticulitis    Essential hypertension    Frequent headaches    GERD (gastroesophageal reflux disease)    History of colon cancer 2015   Surgery; but no chemo or rad was required.  Had a total of 5 surgeries, hx of colostomy, then an ileostomy.   Hx of migraines    Hyperlipemia, mixed    Insomnia    Lateral epicondylitis    Recurrent, right: steroid injection by Dr. Junius Roads Sept and Nov 2020.   Mild intermittent asthma    Obesity, Class I, BMI 30-34.9    Sleep apnea     PAST SURGICAL HISTORY: Past Surgical History:  Procedure Laterality Date   ABI's  03/2019   Normal   CARDIAC CATHETERIZATION  03/30/2020   No CAD.   Cardiac rhythm monitoring  07/20/2020   ZIO patch x 3d-->no arrhythmias   COLON SURGERY  2015   2 cm colon ca excised.   COLONOSCOPY  many   he has had colonoscopy q49mosince 2015.  Most recent was approx 04/2018->normal.   COLOSTOMY  2015   diverticulitis    ILEOSTOMY  2016   RIGHT/LEFT HEART CATH AND CORONARY ANGIOGRAPHY N/A 03/30/2020   Procedure: RIGHT/LEFT HEART CATH AND CORONARY ANGIOGRAPHY;  Surgeon: VJettie Booze MD;  [place] [facility];  Service:  Cardiovascular;  Laterality: N/A;   TEE WITHOUT CARDIOVERSION N/A 04/01/2020   Procedure: TRANSESOPHAGEAL ECHOCARDIOGRAM (TEE);  Surgeon: Wonda Olds, MD;  Location: [place];  Service: Open Heart Surgery;  Laterality: N/A;   THORACIC AORTIC ANEURYSM REPAIR N/A 04/01/2020   Procedure: THORACIC ASCENDING ANEURYSM REPAIR (AAA) USING HEMASHIELD PLATINUM 30 MM VASCULAR SHIELD.;  Surgeon: Wonda Olds, MD;   Location: [place];  Service: Open Heart Surgery;  Laterality: N/A;   THORACIC AORTOGRAM N/A 03/30/2020   Procedure: THORACIC AORTOGRAM;  Surgeon: Jettie Booze, MD;  Location: [place] [facility];  Service: Cardiovascular;  Laterality: N/A;   TRANSTHORACIC ECHOCARDIOGRAM  03/23/2020   49 mm ascend aort aneur.  EF 55-60%, grd II DD, valves normal.    MEDICATIONS: Current Outpatient Medications on File Prior to Visit  Medication Sig Dispense Refill   aspirin EC 81 MG tablet Take 81 mg by mouth daily. Swallow whole.     cyanocobalamin (,VITAMIN B-12,) 1000 MCG/ML injection Inject 1,000 mcg into the muscle every 30 (thirty) days.     fenofibrate (TRICOR) 145 MG tablet Take 1 tablet (145 mg total) by mouth daily. 90 tablet 3   Ipratropium-Albuterol (COMBIVENT) 20-100 MCG/ACT AERS respimat Inhale 1 puff into the lungs 4 (four) times daily. 1 each 0   metoprolol succinate (TOPROL-XL) 50 MG 24 hr tablet Take 1 tablet (50 mg total) by mouth daily. Take with or immediately following a meal. 90 tablet 3   omeprazole (PRILOSEC) 20 MG capsule Take 1 capsule (20 mg total) by mouth daily. 90 capsule 3   rosuvastatin (CRESTOR) 20 MG tablet Take 1 tablet (20 mg total) by mouth daily. 90 tablet 3   valsartan (DIOVAN) 160 MG tablet Take 1 tablet (160 mg total) by mouth daily. 90 tablet 3   No current facility-administered medications on file prior to visit.    ALLERGIES: Allergies  Allergen Reactions   Morphine And Related Other (See Comments)    "all opiates" - "they'll kill me"   Penicillins Anaphylaxis   Toradol [Ketorolac Tromethamine] Other (See Comments)    Unknown reaction (possible rash)    FAMILY HISTORY: Family History  Problem Relation Age of Onset   Lymphoma Mother    Cancer Father        ureter   Stomach cancer Father    Heart disease Father 11       Stents   High blood pressure Father    High Cholesterol Father    Thyroid cancer Sister     Objective:  *** General: No  acute distress.  Patient appears well-groomed.   Head:  Normocephalic/atraumatic Eyes:  fundi examined but not visualized Neck: supple, no paraspinal tenderness, full range of motion Back: No paraspinal tenderness Heart: regular rate and rhythm Lungs: Clear to auscultation bilaterally. Vascular: No carotid bruits. Neurological Exam: Mental status: alert and oriented to person, place, and time, speech fluent and not dysarthric, language intact. Cranial nerves: CN I: not tested CN II: pupils equal, round and reactive to light, visual fields intact CN III, IV, VI:  full range of motion, no nystagmus, no ptosis CN V: facial sensation intact. CN VII: upper and lower face symmetric CN VIII: hearing intact CN IX, X: gag intact, uvula midline CN XI: sternocleidomastoid and trapezius muscles intact CN XII: tongue midline Bulk & Tone: normal, no fasciculations. Motor:  muscle strength 5/5 throughout Sensation:  Pinprick, temperature and vibratory sensation intact. Deep Tendon Reflexes:  2+ throughout,  toes downgoing.   Finger  to nose testing:  Without dysmetria.   Heel to shin:  Without dysmetria.   Gait:  Normal station and stride.  Romberg negative.    Thank you for allowing me to take part in the care of this patient.  Metta Clines, DO  CC:  Shawnie Dapper, MD  Antonietta Jewel, MD

## 2022-03-07 ENCOUNTER — Ambulatory Visit: Payer: BC Managed Care – PPO | Admitting: Neurology

## 2022-03-21 ENCOUNTER — Encounter: Payer: Self-pay | Admitting: Cardiology

## 2022-03-21 ENCOUNTER — Other Ambulatory Visit: Payer: Self-pay | Admitting: Family Medicine

## 2022-03-21 MED ORDER — METOPROLOL SUCCINATE ER 50 MG PO TB24: 50.0000 mg | ORAL_TABLET | Freq: Every day | ORAL | 0 refills | Status: DC

## 2022-03-21 MED ORDER — CYANOCOBALAMIN 1000 MCG/ML IJ SOLN: 1000.0000 ug | INTRAMUSCULAR | 0 refills | Status: AC

## 2022-03-21 MED ORDER — FENOFIBRATE 145 MG PO TABS: 145.0000 mg | ORAL_TABLET | Freq: Every day | ORAL | 0 refills | Status: DC

## 2022-03-21 MED ORDER — VALSARTAN 160 MG PO TABS: 160.0000 mg | ORAL_TABLET | Freq: Every day | ORAL | 0 refills | Status: DC

## 2022-03-21 MED ORDER — ROSUVASTATIN CALCIUM 20 MG PO TABS: 20.0000 mg | ORAL_TABLET | Freq: Every day | ORAL | 0 refills | Status: DC

## 2022-03-22 ENCOUNTER — Other Ambulatory Visit: Payer: Self-pay | Admitting: *Deleted

## 2022-03-22 DIAGNOSIS — R002 Palpitations: Secondary | ICD-10-CM

## 2022-03-22 DIAGNOSIS — E785 Hyperlipidemia, unspecified: Secondary | ICD-10-CM

## 2022-03-27 ENCOUNTER — Other Ambulatory Visit: Payer: Self-pay | Admitting: Cardiology

## 2022-04-22 NOTE — Progress Notes (Signed)
Cardiology Office Note   Date:  04/25/2022    ID:  Clayton Hernandez, DOB 25-Mar-1971, MRN [id number]  PCP:  [name], MD  Cardiologist:   [name], MD Referring:  [name], MD    Chief Complaint  Patient presents with   Aortic Repair       History of Present Illness: Clayton Hernandez is a 52 y.o. male who presents for follow up of a thoracic ascending aneurysm.  He moved here from [place].  He has had a complicated history but per diverticulum that required surgery.  He subsequently was found to have small colon cancers that were resected.  During all of this he was found to have an enlarged thoracic aorta.  There is CT from last 2019 and it was 4.4 cm.  There was no calcium noted in his coronaries at that time.  He had a bovine aortic arch variant.  Clayton aorta was 4.5 cm in November of last year.   It was increased to 4.7 cm.  He had elective aortic replacement with with a 30 mm Hemashield graft.    Since I last saw him he has been doing well.  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 Clayton patient denies any new symptoms such as chest discomfort, neck or arm discomfort. There has been no new shortness of breath, PND or orthopnea. There have been no reported palpitations, presyncope or syncope.    Past Medical History:  Diagnosis Date   Ascending aortic aneurysm (HCC)    4.7 cm. 12/2018 imaging->stable. 02/2020 CT angio/aortoa slight progression->cardiol ref'd him to CT surg   Cancer [facility])    colon cancers per doctor's notes   Chronic renal insufficiency, stage 3 (moderate) (HCC)    GFR 55 ml/min   Cirrhosis, nonalcoholic (HCC)    NAFLD   Diverticulitis    Essential hypertension    Frequent headaches    GERD (gastroesophageal reflux disease)    History of colon cancer 2015   Surgery; but no chemo or rad was required.  Had a total of 5 surgeries, hx of colostomy, then an ileostomy.   Hx of  migraines    Hyperlipemia, mixed    Insomnia    Lateral epicondylitis    Recurrent, right: steroid injection by Dr. Junius Roads Sept and Nov 2020.   Mild intermittent asthma    Obesity, Class I, BMI 30-34.9    Sleep apnea     Past Surgical History:  Procedure Laterality Date   ABI's  03/2019   Normal   CARDIAC CATHETERIZATION  03/30/2020   No CAD.   Cardiac rhythm monitoring  07/20/2020   ZIO patch x 3d-->no arrhythmias   COLON SURGERY  2015   2 cm colon ca excised.   COLONOSCOPY  many   he has had colonoscopy q63mosince 2015.  Most recent was approx 04/2018->normal.   COLOSTOMY  2015   diverticulitis    ILEOSTOMY  2016   RIGHT/LEFT HEART CATH AND CORONARY ANGIOGRAPHY N/A 03/30/2020   Procedure: RIGHT/LEFT HEART CATH AND CORONARY ANGIOGRAPHY;  Surgeon: VJettie Booze MD;  [facility];  Service: Cardiovascular;  Laterality: N/A;   TEE WITHOUT CARDIOVERSION N/A 04/01/2020   Procedure: TRANSESOPHAGEAL ECHOCARDIOGRAM (TEE);  Surgeon: AWonda Olds MD;  [facility]  Service: Open Heart Surgery;  Laterality: N/A;   THORACIC AORTIC ANEURYSM REPAIR N/A 04/01/2020   Procedure:  THORACIC ASCENDING ANEURYSM REPAIR (AAA) USING HEMASHIELD PLATINUM 30 MM VASCULAR SHIELD.;  Surgeon: Wonda Olds, MD;  [facility];  Service: Open Heart Surgery;  Laterality: N/A;   THORACIC AORTOGRAM N/A 03/30/2020   Procedure: THORACIC AORTOGRAM;  Surgeon: Jettie Booze, MD;  Location: [place] [facility];  Service: Cardiovascular;  Laterality: N/A;   TRANSTHORACIC ECHOCARDIOGRAM  03/23/2020   49 mm ascend aort aneur.  EF 55-60%, grd II DD, valves normal.     Current Outpatient Medications  Medication Sig Dispense Refill   icosapent Ethyl (VASCEPA) 1 g capsule Take 1 capsule (1 g total) by mouth 2 (two) times daily. 180 capsule 3   aspirin EC 81 MG tablet Take 81 mg by mouth daily. Swallow whole.     cyanocobalamin (VITAMIN B12) 1000 MCG/ML injection Inject 1 mL  (1,000 mcg total) into Clayton muscle every 30 (thirty) days. 10 mL 0   fenofibrate (TRICOR) 145 MG tablet Take 1 tablet (145 mg total) by mouth daily. 30 tablet 0   Ipratropium-Albuterol (COMBIVENT) 20-100 MCG/ACT AERS respimat Inhale 1 puff into Clayton lungs 4 (four) times daily. 1 each 0   metoprolol succinate (TOPROL-XL) 50 MG 24 hr tablet Take 1 tablet (50 mg total) by mouth daily. Take with or immediately following a meal. 30 tablet 0   omeprazole (PRILOSEC) 20 MG capsule Take 1 capsule (20 mg total) by mouth daily. 90 capsule 3   rosuvastatin (CRESTOR) 20 MG tablet TAKE 1 TABLET BY MOUTH EVERY DAY 90 tablet 3   valsartan (DIOVAN) 160 MG tablet Take 1 tablet (160 mg total) by mouth 2 (two) times daily. 180 tablet 3   No current facility-administered medications for this visit.    Allergies:   Morphine and related, Penicillins, and Toradol [ketorolac tromethamine]    ROS:  Please see [place] history of present illness.   Otherwise, review of systems are positive for none.   All other systems are reviewed and negative.    PHYSICAL EXAM: VS:  BP 132/88   Pulse 64   Ht '6\' 2"'$  (1.88 m)   Wt 230 lb 6.4 oz (104.5 kg)   SpO2 98%   BMI 29.58 kg/m  , BMI Body mass index is 29.58 kg/m. GENERAL:  Well appearing NECK:  No jugular venous distention, waveform within normal limits, carotid upstroke brisk and symmetric, no bruits, no thyromegaly LUNGS:  Clear to auscultation bilaterally CHEST:  Well healed sternotomy scar. HEART:  PMI not displaced or sustained,S1 and S2 within normal limits, no S3, no S4, no clicks, no rubs, no murmurs ABD:  Flat, positive bowel sounds normal in frequency in pitch, no bruits, no rebound, no guarding, no midline pulsatile mass, no hepatomegaly, no splenomegaly EXT:  2 plus pulses throughout, no edema, no cyanosis no clubbing  EKG:  EKG is  ordered today. Sinus rhythm, rate 64, axis within normal limits, intervals within normal limits, no acute ST-T wave  changes.  Recent Labs: 04/24/2022: ALT 24; BUN 23; Creatinine, Ser 1.42; Hemoglobin 18.0; Platelets 132; Potassium 4.5; Sodium 140    Lipid Panel    Component Value Date/Time   CHOL 136 04/24/2022 0931   TRIG 247 (H) 04/24/2022 0931   HDL 31 (L) 04/24/2022 0931   CHOLHDL 4.4 04/24/2022 0931   CHOLHDL 4 07/12/2020 0844   VLDL 41.4 (H) 07/12/2020 0844   LDLCALC 65 04/24/2022 0931   LDLDIRECT 74.0 07/12/2020 0844      Wt Readings from Last 3 Encounters:  04/25/22 230  lb 6.4 oz (104.5 kg)  03/15/21 226 lb 3.2 oz (102.6 kg)  06/24/20 217 lb 3.2 oz (98.5 kg)      Other studies Reviewed: Additional studies/ records that were reviewed today include: Labs Review of Clayton above records demonstrates:    See elsewhere   ASSESSMENT AND PLAN:  THORACIC AORTIC ANEURYSM STATUS POST REPAIR:    He is doing well status post repair.  No further imaging.  SLEEP APNEA:   He uses CPAP.  No change in therapy.  DYSLIPIDEMIA:     LDL was triglycerides remain elevated.  I might add Vascepa.  He will get a lipid profile in 3 months.   HTN:    His blood pressure is not at target with his diastolics.  I am going to increase his valsartan 160 mg twice daily.   I will check a creatinine as he does have some mild renal insufficiency and I am going to follow this closely.  CKD II: I will follow creatinine closely as above.   Current medicines are reviewed at length with Clayton patient today.  Clayton patient does not have concerns regarding medicines.  Clayton following changes have been made: As above  Labs/ tests ordered today include:       Orders Placed This Encounter  Procedures   Lipid panel   Basic metabolic panel   EKG 67-[facility]      Disposition:   FU with me in 6 months.    Signed, [name], MD  04/25/2022 2:14 PM     [facility]

## 2022-04-24 ENCOUNTER — Other Ambulatory Visit: Payer: Self-pay | Admitting: Cardiology

## 2022-04-25 ENCOUNTER — Encounter: Payer: Self-pay | Admitting: Cardiology

## 2022-04-25 ENCOUNTER — Ambulatory Visit: Payer: BC Managed Care – PPO | Attending: Cardiology | Admitting: Cardiology

## 2022-04-25 VITALS — BP 132/88 | HR 64 | Ht 74.0 in | Wt 230.4 lb

## 2022-04-25 DIAGNOSIS — Z79899 Other long term (current) drug therapy: Secondary | ICD-10-CM

## 2022-04-25 DIAGNOSIS — I10 Essential (primary) hypertension: Secondary | ICD-10-CM

## 2022-04-25 DIAGNOSIS — E785 Hyperlipidemia, unspecified: Secondary | ICD-10-CM | POA: Diagnosis not present

## 2022-04-25 DIAGNOSIS — I7121 Aneurysm of the ascending aorta, without rupture: Secondary | ICD-10-CM | POA: Diagnosis not present

## 2022-04-25 LAB — COMPREHENSIVE METABOLIC PANEL
ALT: 24 IU/L (ref 0–44)
AST: 33 IU/L (ref 0–40)
Albumin/Globulin Ratio: 1.7 (ref 1.2–2.2)
Albumin: 4.5 g/dL (ref 3.8–4.9)
Alkaline Phosphatase: 67 IU/L (ref 44–121)
BUN/Creatinine Ratio: 16 (ref 9–20)
BUN: 23 mg/dL (ref 6–24)
Bilirubin Total: 0.7 mg/dL (ref 0.0–1.2)
CO2: 20 mmol/L (ref 20–29)
Calcium: 10 mg/dL (ref 8.7–10.2)
Chloride: 104 mmol/L (ref 96–106)
Creatinine, Ser: 1.42 mg/dL — ABNORMAL HIGH (ref 0.76–1.27)
Globulin, Total: 2.6 g/dL (ref 1.5–4.5)
Glucose: 122 mg/dL — ABNORMAL HIGH (ref 70–99)
Potassium: 4.5 mmol/L (ref 3.5–5.2)
Sodium: 140 mmol/L (ref 134–144)
Total Protein: 7.1 g/dL (ref 6.0–8.5)
eGFR: 59 mL/min/{1.73_m2} — ABNORMAL LOW (ref >59)

## 2022-04-25 LAB — LIPID PANEL
Chol/HDL Ratio: 4.4 ratio (ref 0.0–5.0)
Cholesterol, Total: 136 mg/dL (ref 100–199)
HDL: 31 mg/dL — ABNORMAL LOW (ref >39)
LDL Chol Calc (NIH): 65 mg/dL (ref 0–99)
Triglycerides: 247 mg/dL — ABNORMAL HIGH (ref 0–149)
VLDL Cholesterol Cal: 40 mg/dL (ref 5–40)

## 2022-04-25 LAB — CBC
Hematocrit: 56.5 % — ABNORMAL HIGH (ref 37.5–51.0)
Hemoglobin: 18 g/dL — ABNORMAL HIGH (ref 13.0–17.7)
MCH: 26.4 pg — ABNORMAL LOW (ref 26.6–33.0)
MCHC: 31.9 g/dL (ref 31.5–35.7)
MCV: 83 fL (ref 79–97)
Platelets: 132 10*3/uL — ABNORMAL LOW (ref 150–450)
RBC: 6.82 x10E6/uL — ABNORMAL HIGH (ref 4.14–5.80)
RDW: 15.4 % (ref 11.6–15.4)
WBC: 5.3 10*3/uL (ref 3.4–10.8)

## 2022-04-25 MED ORDER — ICOSAPENT ETHYL 1 G PO CAPS: 1.0000 g | ORAL_CAPSULE | Freq: Two times a day (BID) | ORAL | 3 refills | Status: DC

## 2022-04-25 MED ORDER — VALSARTAN 160 MG PO TABS: 160.0000 mg | ORAL_TABLET | Freq: Two times a day (BID) | ORAL | 3 refills | Status: DC

## 2022-04-25 NOTE — Patient Instructions (Addendum)
Medication Instructions:   INCREASE Valsartan to 160 mg 2 times a day  START Vascepa 1 gram 2 times a day   *If you need a refill on your cardiac medications before your next appointment, please call your pharmacy*  Lab Work: Your physician recommends that you return for lab work in 2 weeks:  BMP  Your physician recommends that you return for lab work in 3 months-July 25, 2022:  Fasting Lipid Panel-DO NOT eat or drink past midnight. Okay to have water  If you have labs (blood work) drawn today and your tests are completely normal, you will receive your results only by: [place] (if you have MyChart) OR A paper copy in the mail If you have any lab test that is abnormal or we need to change your treatment, we will call you to review the results.  Testing/Procedures: NONE ordered at this time of appointment   Follow-Up: At [facility], you and your health needs are our priority.  As part of our continuing mission to provide you with exceptional heart care, we have created designated Provider Care Teams.  These Care Teams include your primary Cardiologist (physician) and Advanced Practice Providers (APPs -  Physician Assistants and Nurse Practitioners) who all work together to provide you with the care you need, when you need it.   Your next appointment:   6 month(s)  The format for your next appointment:   In Person  Provider:   [name], MD     Other Instructions  Important Information About Sugar

## 2022-05-11 ENCOUNTER — Other Ambulatory Visit: Payer: Self-pay | Admitting: Family Medicine

## 2022-05-11 ENCOUNTER — Other Ambulatory Visit: Payer: Self-pay | Admitting: Cardiology

## 2022-05-12 ENCOUNTER — Emergency Department (HOSPITAL_COMMUNITY)
Admission: EM | Admit: 2022-05-12 | Discharge: 2022-05-12 | Disposition: A | Discharge status: Home / Self Care | Payer: BC Managed Care – PPO | Source: Home / Self Care | Attending: Student | Admitting: Student

## 2022-05-12 ENCOUNTER — Telehealth: Payer: Self-pay

## 2022-05-12 ENCOUNTER — Emergency Department (HOSPITAL_COMMUNITY): Payer: BC Managed Care – PPO

## 2022-05-12 ENCOUNTER — Other Ambulatory Visit: Payer: Self-pay

## 2022-05-12 DIAGNOSIS — Z7982 Long term (current) use of aspirin: Secondary | ICD-10-CM | POA: Insufficient documentation

## 2022-05-12 DIAGNOSIS — Z79899 Other long term (current) drug therapy: Secondary | ICD-10-CM | POA: Insufficient documentation

## 2022-05-12 DIAGNOSIS — R42 Dizziness and giddiness: Secondary | ICD-10-CM | POA: Insufficient documentation

## 2022-05-12 DIAGNOSIS — J45909 Unspecified asthma, uncomplicated: Secondary | ICD-10-CM | POA: Insufficient documentation

## 2022-05-12 DIAGNOSIS — Z7951 Long term (current) use of inhaled steroids: Secondary | ICD-10-CM | POA: Insufficient documentation

## 2022-05-12 DIAGNOSIS — R0789 Other chest pain: Secondary | ICD-10-CM | POA: Insufficient documentation

## 2022-05-12 DIAGNOSIS — K8050 Calculus of bile duct without cholangitis or cholecystitis without obstruction: Secondary | ICD-10-CM

## 2022-05-12 DIAGNOSIS — I10 Essential (primary) hypertension: Secondary | ICD-10-CM | POA: Insufficient documentation

## 2022-05-12 DIAGNOSIS — R1011 Right upper quadrant pain: Secondary | ICD-10-CM | POA: Diagnosis not present

## 2022-05-12 DIAGNOSIS — K851 Biliary acute pancreatitis without necrosis or infection: Secondary | ICD-10-CM | POA: Diagnosis not present

## 2022-05-12 LAB — URINALYSIS, ROUTINE W REFLEX MICROSCOPIC
Glucose, UA: 50 mg/dL — AB
Hgb urine dipstick: NEGATIVE
Ketones, ur: NEGATIVE mg/dL
Leukocytes,Ua: NEGATIVE
Nitrite: NEGATIVE
Protein, ur: NEGATIVE mg/dL
Specific Gravity, Urine: 1.027 (ref 1.005–1.030)
pH: 6 (ref 5.0–8.0)

## 2022-05-12 LAB — CBC WITH DIFFERENTIAL/PLATELET
Abs Immature Granulocytes: 0.04 10*3/uL (ref 0.00–0.07)
Basophils Absolute: 0 10*3/uL (ref 0.0–0.1)
Basophils Relative: 0 %
Eosinophils Absolute: 0 10*3/uL (ref 0.0–0.5)
Eosinophils Relative: 0 %
HCT: 48.8 % (ref 39.0–52.0)
Hemoglobin: 15.7 g/dL (ref 13.0–17.0)
Immature Granulocytes: 0 %
Lymphocytes Relative: 14 %
Lymphs Abs: 1.2 10*3/uL (ref 0.7–4.0)
MCH: 27.2 pg (ref 26.0–34.0)
MCHC: 32.2 g/dL (ref 30.0–36.0)
MCV: 84.6 fL (ref 80.0–100.0)
Monocytes Absolute: 0.5 10*3/uL (ref 0.1–1.0)
Monocytes Relative: 6 %
Neutro Abs: 7.2 10*3/uL (ref 1.7–7.7)
Neutrophils Relative %: 80 %
Platelets: 132 10*3/uL — ABNORMAL LOW (ref 150–400)
RBC: 5.77 MIL/uL (ref 4.22–5.81)
RDW: 15.9 % — ABNORMAL HIGH (ref 11.5–15.5)
WBC: 8.9 10*3/uL (ref 4.0–10.5)
nRBC: 0 % (ref 0.0–0.2)

## 2022-05-12 LAB — LIPASE, BLOOD: Lipase: 46 U/L (ref 11–51)

## 2022-05-12 LAB — I-STAT CHEM 8, ED
BUN: 24 mg/dL — ABNORMAL HIGH (ref 6–20)
Calcium, Ion: 1.14 mmol/L — ABNORMAL LOW (ref 1.15–1.40)
Chloride: 107 mmol/L (ref 98–111)
Creatinine, Ser: 1.4 mg/dL — ABNORMAL HIGH (ref 0.61–1.24)
Glucose, Bld: 156 mg/dL — ABNORMAL HIGH (ref 70–99)
HCT: 47 % (ref 39.0–52.0)
Hemoglobin: 16 g/dL (ref 13.0–17.0)
Potassium: 4 mmol/L (ref 3.5–5.1)
Sodium: 141 mmol/L (ref 135–145)
TCO2: 23 mmol/L (ref 22–32)

## 2022-05-12 LAB — COMPREHENSIVE METABOLIC PANEL
ALT: 104 U/L — ABNORMAL HIGH (ref 0–44)
AST: 223 U/L — ABNORMAL HIGH (ref 15–41)
Albumin: 3.5 g/dL (ref 3.5–5.0)
Alkaline Phosphatase: 186 U/L — ABNORMAL HIGH (ref 38–126)
Anion gap: 10 (ref 5–15)
BUN: 19 mg/dL (ref 6–20)
CO2: 22 mmol/L (ref 22–32)
Calcium: 8.6 mg/dL — ABNORMAL LOW (ref 8.9–10.3)
Chloride: 106 mmol/L (ref 98–111)
Creatinine, Ser: 1.5 mg/dL — ABNORMAL HIGH (ref 0.61–1.24)
GFR, Estimated: 55 mL/min — ABNORMAL LOW (ref >60)
Glucose, Bld: 186 mg/dL — ABNORMAL HIGH (ref 70–99)
Potassium: 3.9 mmol/L (ref 3.5–5.1)
Sodium: 138 mmol/L (ref 135–145)
Total Bilirubin: 2.4 mg/dL — ABNORMAL HIGH (ref 0.3–1.2)
Total Protein: 6.1 g/dL — ABNORMAL LOW (ref 6.5–8.1)

## 2022-05-12 MED ORDER — IOHEXOL 350 MG/ML SOLN
100.0000 mL | Freq: Once | INTRAVENOUS | Status: AC | PRN
  Administered 2022-05-12: 100 mL via INTRAVENOUS

## 2022-05-12 MED ORDER — HYDROMORPHONE HCL 1 MG/ML IJ SOLN
0.5000 mg | Freq: Once | INTRAMUSCULAR | Status: AC
  Administered 2022-05-12: 0.5 mg via INTRAVENOUS
  Filled 2022-05-12: qty 1

## 2022-05-12 MED ORDER — SODIUM CHLORIDE 0.9 % IV BOLUS
1000.0000 mL | Freq: Once | INTRAVENOUS | Status: AC
  Administered 2022-05-12: 1000 mL via INTRAVENOUS

## 2022-05-12 MED ORDER — ONDANSETRON 4 MG PO TBDP: 4.0000 mg | ORAL_TABLET | Freq: Three times a day (TID) | ORAL | 0 refills | Status: AC | PRN

## 2022-05-12 MED ORDER — METOPROLOL SUCCINATE ER 50 MG PO TB24: 50.0000 mg | ORAL_TABLET | Freq: Every day | ORAL | 3 refills | Status: DC

## 2022-05-12 MED ORDER — DICYCLOMINE HCL 10 MG/ML IM SOLN
20.0000 mg | Freq: Once | INTRAMUSCULAR | Status: AC
  Administered 2022-05-12: 20 mg via INTRAMUSCULAR
  Filled 2022-05-12: qty 2

## 2022-05-12 MED ORDER — PANTOPRAZOLE SODIUM 40 MG IV SOLR
40.0000 mg | Freq: Once | INTRAVENOUS | Status: AC
  Administered 2022-05-12: 40 mg via INTRAVENOUS
  Filled 2022-05-12: qty 10

## 2022-05-12 MED ORDER — FENOFIBRATE 145 MG PO TABS: 145.0000 mg | ORAL_TABLET | Freq: Every day | ORAL | 3 refills | Status: AC

## 2022-05-12 MED ORDER — ONDANSETRON HCL 4 MG/2ML IJ SOLN
4.0000 mg | Freq: Once | INTRAMUSCULAR | Status: AC
  Administered 2022-05-12: 4 mg via INTRAVENOUS
  Filled 2022-05-12: qty 2

## 2022-05-12 MED ORDER — DICYCLOMINE HCL 20 MG PO TABS: 20.0000 mg | ORAL_TABLET | Freq: Two times a day (BID) | ORAL | 0 refills | Status: AC | PRN

## 2022-05-12 MED ORDER — HYDROMORPHONE HCL 1 MG/ML IJ SOLN
1.0000 mg | Freq: Once | INTRAMUSCULAR | Status: AC
  Administered 2022-05-12: 1 mg via INTRAVENOUS
  Filled 2022-05-12: qty 1

## 2022-05-12 MED ORDER — ALUM & MAG HYDROXIDE-SIMETH 200-200-20 MG/5ML PO SUSP
30.0000 mL | Freq: Once | ORAL | Status: AC
  Administered 2022-05-12: 30 mL via ORAL
  Filled 2022-05-12: qty 30

## 2022-05-12 MED ORDER — LIDOCAINE VISCOUS HCL 2 % MT SOLN
15.0000 mL | Freq: Once | OROMUCOSAL | Status: AC
  Administered 2022-05-12: 15 mL via ORAL
  Filled 2022-05-12: qty 15

## 2022-05-12 MED ORDER — OXYCODONE-ACETAMINOPHEN 5-325 MG PO TABS: 1.0000 | ORAL_TABLET | Freq: Four times a day (QID) | ORAL | 0 refills | Status: AC | PRN

## 2022-05-12 NOTE — ED Notes (Signed)
ED Provider at bedside. 

## 2022-05-12 NOTE — ED Notes (Signed)
Patient transported to CT 

## 2022-05-12 NOTE — ED Provider Notes (Signed)
[facility] Provider Note   CSN: [id number] Arrival date & time: 05/12/22  0031     History {Add pertinent medical, surgical, social history, OB history to HPI:1} Chief Complaint  Patient presents with   Abdominal Pain    Clayton Hernandez is a 52 y.o. male.  52 y/o male with hx of HTN, HLD, GERD, asthma, obesity presents to the ED for evaluation of abdominal pain. Reports sudden onset periumbilical abdominal pain at 1700 tonight. Pain has been constant and severe. Worsening pain provokes nausea; however, not inherently nauseated. Also with an episodes of lightheadedness PTA. Tried Pepto for symptoms at home without relief. Patient reports feeling like he needs to belch and pass flatus, but he has been unable to do so. Did have food poisoning 1 week ago with residual diarrhea. No fevers, melena, hematochezia, urinary symptoms, back pain, chest pain, extremity numbness or paresthesias, extremity weakness. Hx of abdominal surgeries x 5 (per patient) when living in [place]. Also s/p thoracic aortic aneurysm repair in 2021.  The history is provided by the patient. No language interpreter was used.  Abdominal Pain      Home Medications Prior to Admission medications   Medication Sig Start Date End Date Taking? Authorizing Provider  dicyclomine (BENTYL) 20 MG tablet Take 1 tablet (20 mg total) by mouth every 12 (twelve) hours as needed (for abdominal pain). 05/12/22  Yes Antonietta Breach, PA-C  ondansetron (ZOFRAN-ODT) 4 MG disintegrating tablet Take 1 tablet (4 mg total) by mouth every 8 (eight) hours as needed for nausea or vomiting. 05/12/22  Yes Antonietta Breach, PA-C  oxyCODONE-acetaminophen (PERCOCET/ROXICET) 5-325 MG tablet Take 1-2 tablets by mouth every 6 (six) hours as needed for severe pain. 05/12/22  Yes Antonietta Breach, PA-C  aspirin EC 81 MG tablet Take 81 mg by mouth daily. Swallow whole.    [provider]  cyanocobalamin (VITAMIN B12)  1000 MCG/ML injection Inject 1 mL (1,000 mcg total) into the muscle every 30 (thirty) days. 03/21/22   McGowen, Adrian Blackwater, MD  fenofibrate (TRICOR) 145 MG tablet Take 1 tablet (145 mg total) by mouth daily. 03/21/22   [name], MD  icosapent Ethyl (VASCEPA) 1 g capsule Take 1 capsule (1 g total) by mouth 2 (two) times daily. 04/25/22   [name], MD  Ipratropium-Albuterol (COMBIVENT) 20-100 MCG/ACT AERS respimat Inhale 1 puff into the lungs 4 (four) times daily. 05/07/20 03/15/21  Wonda Olds, MD  metoprolol succinate (TOPROL-XL) 50 MG 24 hr tablet Take 1 tablet (50 mg total) by mouth daily. Take with or immediately following a meal. 03/21/22   [name], MD  omeprazole (PRILOSEC) 20 MG capsule Take 1 capsule (20 mg total) by mouth daily. 03/17/21   [name], MD  rosuvastatin (CRESTOR) 20 MG tablet TAKE 1 TABLET BY MOUTH EVERY DAY 03/29/22   [name], MD  valsartan (DIOVAN) 160 MG tablet Take 1 tablet (160 mg total) by mouth 2 (two) times daily. 04/25/22   [name], MD      Allergies    Morphine and related, Penicillins, and Toradol [ketorolac tromethamine]    Review of Systems   Review of Systems  Gastrointestinal:  Positive for abdominal pain.  Ten systems reviewed and are negative for acute change, except as noted in the HPI.    Physical Exam Updated Vital Signs BP (!) 144/90   Pulse 66   Temp 97.8 F (36.6 C) (Oral)   Resp 19   Ht 6'  2" (1.88 m)   Wt 102.1 kg   SpO2 98%   BMI 28.89 kg/m   Physical Exam Vitals and nursing note reviewed.  Constitutional:      General: He is not in acute distress.    Appearance: He is well-developed. He is not diaphoretic.     Comments: Alert, appears uncomfortable  HENT:     Head: Normocephalic and atraumatic.  Eyes:     General: No scleral icterus.    Conjunctiva/sclera: Conjunctivae normal.  Cardiovascular:     Rate and Rhythm: Normal rate and regular rhythm.     Pulses: Normal pulses.  Pulmonary:      Effort: Pulmonary effort is normal. No respiratory distress.     Comments: Respirations even and unlabored Abdominal:     General: There is distension.     Tenderness: There is abdominal tenderness.     Comments: Bowel sounds in all quadrants. Abdomen is distended w/o peritoneal signs. Epigastric and supraumbilical TTP. No palpable masses. Well healed laparoscopic abdominal incision sites from prior surgery.  Musculoskeletal:        General: Normal range of motion.     Cervical back: Normal range of motion.  Skin:    General: Skin is warm and dry.     Coloration: Skin is not pale.     Findings: No erythema or rash.  Neurological:     Mental Status: He is alert and oriented to person, place, and time.     Coordination: Coordination normal.  Psychiatric:        Behavior: Behavior normal.     ED Results / Procedures / Treatments   Labs (all labs ordered are listed, but only abnormal results are displayed) Labs Reviewed  CBC WITH DIFFERENTIAL/PLATELET - Abnormal; Notable for the following components:      Result Value   RDW 15.9 (*)    Platelets 132 (*)    All other components within normal limits  COMPREHENSIVE METABOLIC PANEL - Abnormal; Notable for the following components:   Glucose, Bld 186 (*)    Creatinine, Ser 1.50 (*)    Calcium 8.6 (*)    Total Protein 6.1 (*)    AST 223 (*)    ALT 104 (*)    Alkaline Phosphatase 186 (*)    Total Bilirubin 2.4 (*)    GFR, Estimated 55 (*)    All other components within normal limits  URINALYSIS, ROUTINE W REFLEX MICROSCOPIC - Abnormal; Notable for the following components:   Color, Urine AMBER (*)    Glucose, UA 50 (*)    Bilirubin Urine SMALL (*)    All other components within normal limits  I-STAT CHEM 8, ED - Abnormal; Notable for the following components:   BUN 24 (*)    Creatinine, Ser 1.40 (*)    Glucose, Bld 156 (*)    Calcium, Ion 1.14 (*)    All other components within normal limits  LIPASE, BLOOD     EKG None  Radiology CT Angio Chest/Abd/Pel for Dissection W and/or Wo Contrast  Result Date: 05/12/2022 CLINICAL DATA:  Presents with epigastric pain and chest pain. History of 04/01/2020 ascending aortic aneurysm repair need to check for dissection or recurring aneurysm. EXAM: CT ANGIOGRAPHY CHEST, ABDOMEN AND PELVIS TECHNIQUE: Non-contrast CT of the chest was initially obtained. Multidetector CT imaging through the chest, abdomen and pelvis was performed using the standard protocol during bolus administration of intravenous contrast. Multiplanar reconstructed images and MIPs were obtained and reviewed to evaluate the  vascular anatomy. RADIATION DOSE REDUCTION: This exam was performed according to the departmental dose-optimization program which includes automated exposure control, adjustment of the mA and/or kV according to patient size and/or use of iterative reconstruction technique. CONTRAST:  144m OMNIPAQUE IOHEXOL 350 MG/ML SOLN COMPARISON:  Preoperative CTA chest 02/19/2020. FINDINGS: CTA CHEST FINDINGS Cardiovascular: There are no visible coronary or aortic calcifications. There are intact median sternotomy sutures, well-healed median sternotomy, and postsurgical changes compatible with interval sleeve repair of the previously noted 4.7 cm ascending aortic aneurysm. There is mild descending aortic tortuosity. The great vessels are clear. There is no aortic dissection or stenosis. Post repair the aorta is within normal caliber limits except for a mild dilatation in the root at the sinuses of Valsalva where the diameter is 4.2 cm, previously 4.0 cm. There is mild panchamber cardiomegaly. No pericardial effusion is seen. The pulmonary veins are decompressed but there is prominence of the pulmonary trunk, which measures 5.6 cm consistent with arterial hypertension, previously measuring 3.1 cm. No arterial embolism is seen or findings of acute right heart strain. Mediastinum/Nodes: No enlarged  mediastinal, hilar, or axillary lymph nodes. Thyroid gland, trachea, and esophagus demonstrate no significant findings. Mild lipomatosis is again noted in the superior mediastinum, increased. Asymmetric elevation of the right hemidiaphragm is unchanged. Lungs/Pleura: No pleural effusion, thickening or pneumothorax. There is mild posterior atelectasis. There is mild bronchial thickening diffusely without visible bronchial plugging. There are no pulmonary infiltrate or nodule. Musculoskeletal: No acute or significant osseous findings. No chest wall mass. Review of the MIP images confirms the above findings. CTA ABDOMEN AND PELVIS FINDINGS VASCULAR Aorta: Normal caliber aorta without aneurysm, dissection, vasculitis or significant stenosis. There are trace calcific plaques distally. Celiac: Normal. SMA: Normal. Renals: Both are single.  Both are normal. IMA: Normal. Inflow: Patent without evidence of aneurysm, dissection, vasculitis or significant stenosis. There are minimal calcific plaques in both common iliac arteries. Veins: Unopacified and not evaluated. Review of the MIP images confirms the above findings. NON-VASCULAR Hepatobiliary: The liver is 18.5 cm in length with mild-to-moderate steatosis. A flash filling hemangioma, again measuring 1 cm, is redemonstrated in the posterior segment of the right lobe on 7:125. No other focal abnormality is seen. Gallbladder is dilated to 10.5 cm. There are multiple small stones posteriorly. Equivocal wall thickening on the coronal reformatting. Correlate clinically for cholecystitis. Ultrasound may be helpful. There is no biliary dilatation. Pancreas: No abnormality. Spleen: Mild splenomegaly, splenic AP diameter 14.7 cm, previously 13.9 cm. No mass. Adrenals/Urinary Tract: There is no adrenal mass. There is homogeneous thin walled 1.6 cm cyst in the posterior aspect of the right kidney. Hounsfield density is 8.3. No follow-up imaging is recommended. There is a 1.1 cm  homogeneous cyst in the posterior upper left kidney, Hounsfield density is 2. No follow-up imaging is recommended. The rest of both kidneys enhance homogeneously. There is no urinary stone or obstruction. The bladder unremarkable for the degree of distention. Stomach/Bowel: Stomach distended with fluid and food products. No wall thickening or duodenal dilatation. This could reflect changes due to a recent ingestion or due to impaired gastric emptying. There is no small bowel obstruction or inflammation. There is postsurgical change of ileocecal junction compatible with a distal ileectomy and side-to-side anastomosis. The appendix remains and is normal caliber. There is diverticulosis. There is hazy reaction along side the junction of the descending and sigmoid colon which could be due to a mild diverticulitis or fat scarring from old diverticular disease. More distally there  is a rectosigmoid colocolic surgical anastomosis which is well patent. Lymphatic: There are multiple borderline to minimally prominent mesenteric lymph nodes in the root fat and a few in the right lower quadrant go Ward haziness in the mesenteric root fat. Query mesenteric adenitis/panniculitis or fibrosing mesenteritis. No further adenopathy. Reproductive: Prostate is unremarkable. Other: Small umbilical fat hernia. No incarcerated hernias. No free air, free fluid or hemorrhage Musculoskeletal: No acute or significant osseous findings. Review of the MIP images confirms the above findings. IMPRESSION: 1. No findings of acute aortic syndrome. 2. Postsurgical changes of the ascending aorta with mild dilatation in the root at the sinuses of Valsalva where the diameter is 4.2 cm, previously 4.0 cm. The remainder is within normal caliber limits post repair. Recommend annual imaging followup by CTA or MRA. This recommendation follows 2010 ACCF/AHA/AATS/ACR/ASA/SCA/SCAI/SIR/STS/SVM Guidelines for the Diagnosis and Management of Patients with Thoracic  Aortic Disease. Circulation. 2010; 121: E332-R518. Aortic aneurysm NOS (ICD10-I71.9) 3. Cardiomegaly without evidence of CHF. 4. Prominent pulmonary trunk consistent with arterial hypertension, increased in prominence from 2021. 5. Bronchitis.  No visible focal pneumonia. 6. Distended gallbladder with cholelithiasis and equivocal wall thickening. Correlate clinically for acute cholecystitis. Ultrasound may be helpful. 7. Distended stomach with fluid and food products. This could be due to a recent ingestion or impaired gastric emptying. 8. Diverticulosis with hazy reaction along side the junction of the descending and sigmoid colon, which could be due to a mild diverticulitis or fat scarring from old diverticular disease. No free air or diverticular abscess. 9. Mild hepatosplenomegaly with mild hepatic steatosis and stable 1 cm flash filling hemangioma in the right lobe. 10. Small umbilical fat hernia. 11. Multiple borderline to minimally prominent mesenteric nodes in the root fat and a few in the right lower quadrant with "misty" mesentery. Query mesenteric adenitis/panniculitis or fibrosing mesenteritis. Electronically Signed   By: Telford Nab M.D.   On: 05/12/2022 04:18   US Abdomen Limited  Result Date: 05/12/2022 CLINICAL DATA:  Right upper quadrant abdominal pain. EXAM: ULTRASOUND ABDOMEN LIMITED RIGHT UPPER QUADRANT COMPARISON:  None Available. FINDINGS: Gallbladder: There multiple gallstones. No gallbladder wall thickening or pericholecystic fluid. Negative sonographic Murphy's sign. Common bile duct: Diameter: 5 mm Liver: There is diffuse increased liver echogenicity most commonly seen in the setting of fatty infiltration. Superimposed inflammation or fibrosis is not excluded. Clinical correlation is recommended. Portal vein is patent on color Doppler imaging with normal direction of blood flow towards the liver. Other: None. IMPRESSION: 1. Cholelithiasis without sonographic evidence of acute  cholecystitis. 2. Fatty liver. Electronically Signed   By: Anner Crete M.D.   On: 05/12/2022 02:55    Procedures Procedures  {Document cardiac monitor, telemetry assessment procedure when appropriate:1}  Medications Ordered in ED Medications  HYDROmorphone (DILAUDID) injection 1 mg (1 mg Intravenous Given 05/12/22 0116)  ondansetron (ZOFRAN) injection 4 mg (4 mg Intravenous Given 05/12/22 0118)  sodium chloride 0.9 % bolus 1,000 mL (0 mLs Intravenous Stopped 05/12/22 0232)  pantoprazole (PROTONIX) injection 40 mg (40 mg Intravenous Given 05/12/22 0231)  HYDROmorphone (DILAUDID) injection 0.5 mg (0.5 mg Intravenous Given 05/12/22 0232)  alum & mag hydroxide-simeth (MAALOX/MYLANTA) 200-200-20 MG/5ML suspension 30 mL (30 mLs Oral Given 05/12/22 0226)    And  lidocaine (XYLOCAINE) 2 % viscous mouth solution 15 mL (15 mLs Oral Given 05/12/22 0226)  dicyclomine (BENTYL) injection 20 mg (20 mg Intramuscular Given 05/12/22 0227)  iohexol (OMNIPAQUE) 350 MG/ML injection 100 mL (100 mLs Intravenous Contrast Given 05/12/22 0311)  ED Course/ Medical Decision Making/ A&P Clinical Course as of 05/12/22 1324  Fri May 12, 2022  0119 Given hx of ascending aortic aneurysm s/p repair, plan for CTA chest/abd/pelvis. This will assess for issues with aneurysmal repair, new aneurysm, aortic dissection as well as emergent abdominal pathology including, but not limited to, pSBO vs SBO vs ruptured viscous. [KH]  0308 Multiple gallstones noted on abdominal US without wall thickening or pericholecystic fluid. No sonographic Murphy's sign. With LFT abnormalities, suspect biliary colic/symptomatic cholelithiasis. Pending CTA to exclude other causes of pain. [KH]  [phone] Patient is pain free. CTA negative for aortic pathology. Gallstones, again, seen. No other acute process in the abdomen/pelvis. [KH]    Clinical Course User Index [KH] Antonietta Breach, PA-C   {   Click here for ABCD2, HEART and other calculatorsREFRESH  Note before signing :1}                          Medical Decision Making Amount and/or Complexity of Data Reviewed Labs: ordered. Radiology: ordered.  Risk OTC drugs. Prescription drug management.   This patient presents to the ED for concern of abdominal pain, this involves an extensive number of treatment options, and is a complaint that carries with it a high risk of complications and morbidity.  The differential diagnosis includes ***   Co morbidities that complicate the patient evaluation  ***   Additional history obtained:  Additional history obtained from *** External records from outside source obtained and reviewed including ***   Lab Tests:  I Ordered, and personally interpreted labs.  The pertinent results include:  ***   Imaging Studies ordered:  I ordered imaging studies including ***  I independently visualized and interpreted imaging which showed *** I agree with the radiologist interpretation   Cardiac Monitoring:  The patient was maintained on a cardiac monitor.  I personally viewed and interpreted the cardiac monitored which showed an underlying rhythm of: ***   Medicines ordered and prescription drug management:  I ordered medication including ***  for ***  Reevaluation of the patient after these medicines showed that the patient {resolved/improved/worsened:23923::"improved"} I have reviewed the patients home medicines and have made adjustments as needed   Test Considered:  ***   Critical Interventions:  ***   Consultations Obtained:  I requested consultation with the ***,  and discussed lab and imaging findings as well as pertinent plan - they recommend: ***   Problem List / ED Course:  ***   Reevaluation:  After the interventions noted above, I reevaluated the patient and found that they have :{resolved/improved/worsened:23923::"improved"}   Social Determinants of Health:  ***   Dispostion:  After consideration of  the diagnostic results and the patients response to treatment, I feel that the patent would benefit from ***.    {Document critical care time when appropriate:1} {Document review of labs and clinical decision tools ie heart score, Chads2Vasc2 etc:1}  {Document your independent review of radiology images, and any outside records:1} {Document your discussion with family members, caretakers, and with consultants:1} {Document social determinants of health affecting pt's care:1} {Document your decision making why or why not admission, treatments were needed:1} Final Clinical Impression(s) / ED Diagnoses Final diagnoses:  Biliary colic    Rx / DC Orders ED Discharge Orders          Ordered    oxyCODONE-acetaminophen (PERCOCET/ROXICET) 5-325 MG tablet  Every 6 hours PRN        05/12/22  0454    ondansetron (ZOFRAN-ODT) 4 MG disintegrating tablet  Every 8 hours PRN        05/12/22 0454    dicyclomine (BENTYL) 20 MG tablet  Every 12 hours PRN        05/12/22 0454

## 2022-05-12 NOTE — ED Notes (Signed)
Patient describes needing to pass gas or burp but unable to. Does not recall last time he did.

## 2022-05-12 NOTE — Telephone Encounter (Signed)
Transition Care Management Unsuccessful Follow-up Telephone Call  Date of discharge and from where:  [facility] 05/12/22  Attempts:  1st Attempt  Reason for unsuccessful TCM follow-up call:  Left voice message

## 2022-05-12 NOTE — Discharge Instructions (Addendum)
Your work up today is consistent with biliary colic.  We recommend limiting your consumption of fatty and fried foods as this may increase the likelihood of recurrent pain from biliary colic.  Should you develop acute pain, take Percocet and Bentyl as prescribed.  You may use Zofran for nausea.  We do advise you schedule follow-up with a general surgeon to have a discussion about elective gallbladder surgery.  Follow-up with your primary care doctor to have your liver function tests rechecked in approximately 1 week.  Return to the ED for new or concerning symptoms such as worsening pain, fever 100.4 F, if your skin turns yellow and becomes jaundiced, or if you start vomiting blood.

## 2022-05-12 NOTE — ED Triage Notes (Addendum)
BIB EMS from home for epigastric pain described as gas and pressure for a couple of hours, no relief. Hx intensive hx of surgeries in abdomen  Received 1xnitro, '6mg'$  morphine, 500cc fluid

## 2022-05-13 ENCOUNTER — Emergency Department (HOSPITAL_COMMUNITY): Payer: BC Managed Care – PPO

## 2022-05-13 ENCOUNTER — Other Ambulatory Visit: Payer: Self-pay

## 2022-05-13 ENCOUNTER — Inpatient Hospital Stay (HOSPITAL_COMMUNITY)
Admission: EM | Admit: 2022-05-13 | Discharge: 2022-05-18 | DRG: 418 | Disposition: A | Discharge status: Home / Self Care | Payer: BC Managed Care – PPO | Attending: Internal Medicine | Admitting: Internal Medicine

## 2022-05-13 DIAGNOSIS — Z807 Family history of other malignant neoplasms of lymphoid, hematopoietic and related tissues: Secondary | ICD-10-CM | POA: Diagnosis not present

## 2022-05-13 DIAGNOSIS — Z800 Family history of malignant neoplasm of digestive organs: Secondary | ICD-10-CM | POA: Diagnosis not present

## 2022-05-13 DIAGNOSIS — Z79899 Other long term (current) drug therapy: Secondary | ICD-10-CM

## 2022-05-13 DIAGNOSIS — G4700 Insomnia, unspecified: Secondary | ICD-10-CM | POA: Diagnosis present

## 2022-05-13 DIAGNOSIS — Z8249 Family history of ischemic heart disease and other diseases of the circulatory system: Secondary | ICD-10-CM | POA: Diagnosis not present

## 2022-05-13 DIAGNOSIS — Z87891 Personal history of nicotine dependence: Secondary | ICD-10-CM

## 2022-05-13 DIAGNOSIS — Z8679 Personal history of other diseases of the circulatory system: Secondary | ICD-10-CM | POA: Diagnosis not present

## 2022-05-13 DIAGNOSIS — I7121 Aneurysm of the ascending aorta, without rupture: Secondary | ICD-10-CM | POA: Diagnosis present

## 2022-05-13 DIAGNOSIS — G43909 Migraine, unspecified, not intractable, without status migrainosus: Secondary | ICD-10-CM | POA: Diagnosis present

## 2022-05-13 DIAGNOSIS — G4733 Obstructive sleep apnea (adult) (pediatric): Secondary | ICD-10-CM | POA: Diagnosis present

## 2022-05-13 DIAGNOSIS — D696 Thrombocytopenia, unspecified: Secondary | ICD-10-CM | POA: Diagnosis present

## 2022-05-13 DIAGNOSIS — I1310 Hypertensive heart and chronic kidney disease without heart failure, with stage 1 through stage 4 chronic kidney disease, or unspecified chronic kidney disease: Secondary | ICD-10-CM | POA: Diagnosis present

## 2022-05-13 DIAGNOSIS — K851 Biliary acute pancreatitis without necrosis or infection: Principal | ICD-10-CM | POA: Diagnosis present

## 2022-05-13 DIAGNOSIS — K8070 Calculus of gallbladder and bile duct without cholecystitis without obstruction: Secondary | ICD-10-CM | POA: Diagnosis not present

## 2022-05-13 DIAGNOSIS — D1809 Hemangioma of other sites: Secondary | ICD-10-CM | POA: Diagnosis present

## 2022-05-13 DIAGNOSIS — J40 Bronchitis, not specified as acute or chronic: Secondary | ICD-10-CM | POA: Diagnosis present

## 2022-05-13 DIAGNOSIS — I10 Essential (primary) hypertension: Secondary | ICD-10-CM | POA: Diagnosis present

## 2022-05-13 DIAGNOSIS — Z85038 Personal history of other malignant neoplasm of large intestine: Secondary | ICD-10-CM | POA: Diagnosis not present

## 2022-05-13 DIAGNOSIS — R7989 Other specified abnormal findings of blood chemistry: Secondary | ICD-10-CM | POA: Diagnosis present

## 2022-05-13 DIAGNOSIS — R7401 Elevation of levels of liver transaminase levels: Secondary | ICD-10-CM

## 2022-05-13 DIAGNOSIS — N182 Chronic kidney disease, stage 2 (mild): Secondary | ICD-10-CM | POA: Diagnosis present

## 2022-05-13 DIAGNOSIS — E785 Hyperlipidemia, unspecified: Secondary | ICD-10-CM | POA: Diagnosis present

## 2022-05-13 DIAGNOSIS — D751 Secondary polycythemia: Secondary | ICD-10-CM | POA: Diagnosis present

## 2022-05-13 DIAGNOSIS — E669 Obesity, unspecified: Secondary | ICD-10-CM | POA: Diagnosis present

## 2022-05-13 DIAGNOSIS — K760 Fatty (change of) liver, not elsewhere classified: Secondary | ICD-10-CM | POA: Diagnosis present

## 2022-05-13 DIAGNOSIS — K8012 Calculus of gallbladder with acute and chronic cholecystitis without obstruction: Secondary | ICD-10-CM | POA: Diagnosis present

## 2022-05-13 DIAGNOSIS — K219 Gastro-esophageal reflux disease without esophagitis: Secondary | ICD-10-CM | POA: Diagnosis present

## 2022-05-13 DIAGNOSIS — K660 Peritoneal adhesions (postprocedural) (postinfection): Secondary | ICD-10-CM | POA: Diagnosis present

## 2022-05-13 DIAGNOSIS — Z886 Allergy status to analgesic agent status: Secondary | ICD-10-CM

## 2022-05-13 DIAGNOSIS — N183 Chronic kidney disease, stage 3 unspecified: Secondary | ICD-10-CM

## 2022-05-13 DIAGNOSIS — M7711 Lateral epicondylitis, right elbow: Secondary | ICD-10-CM | POA: Diagnosis present

## 2022-05-13 DIAGNOSIS — Z83438 Family history of other disorder of lipoprotein metabolism and other lipidemia: Secondary | ICD-10-CM

## 2022-05-13 DIAGNOSIS — E782 Mixed hyperlipidemia: Secondary | ICD-10-CM | POA: Diagnosis present

## 2022-05-13 DIAGNOSIS — K7460 Unspecified cirrhosis of liver: Secondary | ICD-10-CM | POA: Diagnosis present

## 2022-05-13 DIAGNOSIS — Z885 Allergy status to narcotic agent status: Secondary | ICD-10-CM

## 2022-05-13 DIAGNOSIS — J4520 Mild intermittent asthma, uncomplicated: Secondary | ICD-10-CM | POA: Diagnosis present

## 2022-05-13 DIAGNOSIS — Z6830 Body mass index (BMI) 30.0-30.9, adult: Secondary | ICD-10-CM

## 2022-05-13 DIAGNOSIS — Z880 Allergy status to penicillin: Secondary | ICD-10-CM | POA: Diagnosis not present

## 2022-05-13 DIAGNOSIS — K8020 Calculus of gallbladder without cholecystitis without obstruction: Secondary | ICD-10-CM | POA: Diagnosis present

## 2022-05-13 DIAGNOSIS — R162 Hepatomegaly with splenomegaly, not elsewhere classified: Secondary | ICD-10-CM | POA: Diagnosis present

## 2022-05-13 DIAGNOSIS — R1011 Right upper quadrant pain: Secondary | ICD-10-CM

## 2022-05-13 DIAGNOSIS — R591 Generalized enlarged lymph nodes: Secondary | ICD-10-CM | POA: Diagnosis present

## 2022-05-13 DIAGNOSIS — R109 Unspecified abdominal pain: Secondary | ICD-10-CM

## 2022-05-13 DIAGNOSIS — N1831 Chronic kidney disease, stage 3a: Secondary | ICD-10-CM | POA: Diagnosis not present

## 2022-05-13 LAB — COMPREHENSIVE METABOLIC PANEL
ALT: 246 U/L — ABNORMAL HIGH (ref 0–44)
AST: 343 U/L — ABNORMAL HIGH (ref 15–41)
Albumin: 3.6 g/dL (ref 3.5–5.0)
Alkaline Phosphatase: 336 U/L — ABNORMAL HIGH (ref 38–126)
Anion gap: 10 (ref 5–15)
BUN: 14 mg/dL (ref 6–20)
CO2: 22 mmol/L (ref 22–32)
Calcium: 8.7 mg/dL — ABNORMAL LOW (ref 8.9–10.3)
Chloride: 104 mmol/L (ref 98–111)
Creatinine, Ser: 1.33 mg/dL — ABNORMAL HIGH (ref 0.61–1.24)
GFR, Estimated: >60 mL/min (ref >60)
Glucose, Bld: 106 mg/dL — ABNORMAL HIGH (ref 70–99)
Potassium: 3.8 mmol/L (ref 3.5–5.1)
Sodium: 136 mmol/L (ref 135–145)
Total Bilirubin: 8.7 mg/dL — ABNORMAL HIGH (ref 0.3–1.2)
Total Protein: 6.4 g/dL — ABNORMAL LOW (ref 6.5–8.1)

## 2022-05-13 LAB — URINALYSIS, ROUTINE W REFLEX MICROSCOPIC
Glucose, UA: NEGATIVE mg/dL
Hgb urine dipstick: NEGATIVE
Ketones, ur: NEGATIVE mg/dL
Leukocytes,Ua: NEGATIVE
Nitrite: NEGATIVE
Protein, ur: NEGATIVE mg/dL
Specific Gravity, Urine: 1.023 (ref 1.005–1.030)
pH: 5 (ref 5.0–8.0)

## 2022-05-13 LAB — CBC
HCT: 52.1 % — ABNORMAL HIGH (ref 39.0–52.0)
Hemoglobin: 17.3 g/dL — ABNORMAL HIGH (ref 13.0–17.0)
MCH: 27.8 pg (ref 26.0–34.0)
MCHC: 33.2 g/dL (ref 30.0–36.0)
MCV: 83.8 fL (ref 80.0–100.0)
Platelets: 142 10*3/uL — ABNORMAL LOW (ref 150–400)
RBC: 6.22 MIL/uL — ABNORMAL HIGH (ref 4.22–5.81)
RDW: 17.6 % — ABNORMAL HIGH (ref 11.5–15.5)
WBC: 7.9 10*3/uL (ref 4.0–10.5)
nRBC: 0 % (ref 0.0–0.2)

## 2022-05-13 LAB — TYPE AND SCREEN
ABO/RH(D): O POS
Antibody Screen: NEGATIVE

## 2022-05-13 LAB — PROTIME-INR
INR: 1.3 — ABNORMAL HIGH (ref 0.8–1.2)
Prothrombin Time: 15.6 seconds — ABNORMAL HIGH (ref 11.4–15.2)

## 2022-05-13 LAB — LIPASE, BLOOD: Lipase: 661 U/L — ABNORMAL HIGH (ref 11–51)

## 2022-05-13 MED ORDER — IPRATROPIUM-ALBUTEROL 0.5-2.5 (3) MG/3ML IN SOLN: 3.0000 mL | RESPIRATORY_TRACT | Status: DC | PRN

## 2022-05-13 MED ORDER — PANTOPRAZOLE SODIUM 40 MG IV SOLR
40.0000 mg | INTRAVENOUS | Status: DC
  Administered 2022-05-14 – 2022-05-17 (×4): 40 mg via INTRAVENOUS
  Filled 2022-05-13 (×5): qty 10

## 2022-05-13 MED ORDER — DICYCLOMINE HCL 10 MG PO CAPS
10.0000 mg | ORAL_CAPSULE | Freq: Once | ORAL | Status: AC
  Administered 2022-05-13: 10 mg via ORAL
  Filled 2022-05-13: qty 1

## 2022-05-13 MED ORDER — HYDROMORPHONE HCL 1 MG/ML IJ SOLN
1.0000 mg | Freq: Once | INTRAMUSCULAR | Status: AC
  Administered 2022-05-13: 1 mg via INTRAVENOUS
  Filled 2022-05-13: qty 1

## 2022-05-13 MED ORDER — ONDANSETRON HCL 4 MG PO TABS
4.0000 mg | ORAL_TABLET | Freq: Four times a day (QID) | ORAL | Status: DC | PRN
  Administered 2022-05-15 – 2022-05-17 (×2): 4 mg via ORAL
  Filled 2022-05-13 (×2): qty 1

## 2022-05-13 MED ORDER — CIPROFLOXACIN IN D5W 400 MG/200ML IV SOLN
400.0000 mg | Freq: Two times a day (BID) | INTRAVENOUS | Status: DC
  Administered 2022-05-13 – 2022-05-17 (×8): 400 mg via INTRAVENOUS
  Filled 2022-05-13 (×8): qty 200

## 2022-05-13 MED ORDER — OXYMETAZOLINE HCL 0.05 % NA SOLN: 1.0000 | Freq: Every day | NASAL | Status: AC | PRN

## 2022-05-13 MED ORDER — IRBESARTAN 150 MG PO TABS
150.0000 mg | ORAL_TABLET | Freq: Every day | ORAL | Status: DC
  Administered 2022-05-14 – 2022-05-18 (×5): 150 mg via ORAL
  Filled 2022-05-13 (×5): qty 1

## 2022-05-13 MED ORDER — LACTATED RINGERS IV SOLN: INTRAVENOUS | Status: DC

## 2022-05-13 MED ORDER — IOHEXOL 350 MG/ML SOLN
75.0000 mL | Freq: Once | INTRAVENOUS | Status: AC | PRN
  Administered 2022-05-13: 75 mL via INTRAVENOUS

## 2022-05-13 MED ORDER — SODIUM CHLORIDE 0.9 % IV SOLN: INTRAVENOUS | Status: AC

## 2022-05-13 MED ORDER — PANTOPRAZOLE SODIUM 40 MG IV SOLR
40.0000 mg | Freq: Once | INTRAVENOUS | Status: AC
  Administered 2022-05-13: 40 mg via INTRAVENOUS
  Filled 2022-05-13: qty 10

## 2022-05-13 MED ORDER — ZOLPIDEM TARTRATE 5 MG PO TABS
10.0000 mg | ORAL_TABLET | Freq: Every evening | ORAL | Status: DC | PRN
  Administered 2022-05-15 – 2022-05-17 (×3): 10 mg via ORAL
  Filled 2022-05-13 (×4): qty 2

## 2022-05-13 MED ORDER — METOPROLOL SUCCINATE ER 50 MG PO TB24
50.0000 mg | ORAL_TABLET | Freq: Every day | ORAL | Status: DC
  Administered 2022-05-14 – 2022-05-18 (×5): 50 mg via ORAL
  Filled 2022-05-13: qty 1
  Filled 2022-05-13: qty 2
  Filled 2022-05-13 (×2): qty 1
  Filled 2022-05-13: qty 2

## 2022-05-13 MED ORDER — IPRATROPIUM-ALBUTEROL 20-100 MCG/ACT IN AERS: 1.0000 | INHALATION_SPRAY | RESPIRATORY_TRACT | Status: DC | PRN

## 2022-05-13 MED ORDER — SODIUM CHLORIDE 0.9 % IV BOLUS
1000.0000 mL | Freq: Once | INTRAVENOUS | Status: AC
  Administered 2022-05-13: 1000 mL via INTRAVENOUS

## 2022-05-13 MED ORDER — ONDANSETRON HCL 4 MG/2ML IJ SOLN
4.0000 mg | Freq: Once | INTRAMUSCULAR | Status: AC
  Administered 2022-05-13: 4 mg via INTRAVENOUS
  Filled 2022-05-13: qty 2

## 2022-05-13 MED ORDER — ONDANSETRON HCL 4 MG/2ML IJ SOLN
4.0000 mg | Freq: Four times a day (QID) | INTRAMUSCULAR | Status: DC | PRN
  Administered 2022-05-13 – 2022-05-16 (×9): 4 mg via INTRAVENOUS
  Filled 2022-05-13 (×9): qty 2

## 2022-05-13 MED ORDER — METRONIDAZOLE 500 MG/100ML IV SOLN
500.0000 mg | Freq: Two times a day (BID) | INTRAVENOUS | Status: DC
  Administered 2022-05-13 – 2022-05-17 (×8): 500 mg via INTRAVENOUS
  Filled 2022-05-13 (×8): qty 100

## 2022-05-13 MED ORDER — HYDROMORPHONE HCL 1 MG/ML IJ SOLN
1.0000 mg | INTRAMUSCULAR | Status: DC | PRN
  Administered 2022-05-13 – 2022-05-17 (×26): 1 mg via INTRAVENOUS
  Filled 2022-05-13 (×29): qty 1

## 2022-05-13 NOTE — Assessment & Plan Note (Signed)
States he has had this forever  Will check B12/smear and HIV Does have NAFLD and could be secondary to this  Does not drink alcohol  Recommend outpatient heme f/u

## 2022-05-13 NOTE — Assessment & Plan Note (Addendum)
Stable  Continue home medication

## 2022-05-13 NOTE — ED Provider Triage Note (Signed)
Emergency Medicine Provider Triage Evaluation Note  JAMAI DOLCE , a 52 y.o. male  was evaluated in triage.  Pt complains of abdominal pain  Review of Systems  Positive: Abdominal pain  Negative: fever  Physical Exam  BP (!) 153/98 (BP Location: Right Arm)   Pulse 81   Temp 100.3 F (37.9 C) (Oral)   Resp (!) 24   Ht '6\' 2"'$  (1.88 m)   Wt 102 kg   SpO2 96%   BMI 28.87 kg/m  Gen:   Awake, no distress   Resp:  Normal effort  MSK:   Moves extremities without difficulty  Other:    Medical Decision Making  Medically screening exam initiated at 2:30 PM.  Appropriate orders placed.  KHALEL ALMS was informed that the remainder of the evaluation will be completed by another provider, this initial triage assessment does not replace that evaluation, and the importance of remaining in the ED until their evaluation is complete.     Fransico Meadow, [place] 05/13/22 1431

## 2022-05-13 NOTE — ED Provider Notes (Addendum)
[facility] Provider Note  CSN: [id number] Arrival date & time: 05/13/22 1408  Chief Complaint(s) Abdominal Pain  HPI Clayton Hernandez is a 52 y.o. male with past medical history as below, significant for send aortic aneurysm s/p repair, CKD, NASH, hypertension, GERD, HLD, asthma, sleep apnea who presents to the ED with complaint of right upper quadrant, epigastric pain.  Patient was evaluated here yesterday, workup notable for elevated creatinine, transaminitis, elevated bilirubin elevated alkaline phosphatase.  Patient had CT a dissection and right upper quadrant ultrasound which did show gallstones without cholecystitis, postsurgical changes from prior aortic surgery, cardiomegaly, bronchitis, distended stomach, hepatosplenomegaly and hepatic steatosis, hepatic and angioma, mesenteric lymphadenopathy.  Patient reports he felt better while he was in the ER but soon as he got home his symptoms returned and are worse.  He is having nausea, cramping, severe pain to epigastrium right upper quadrant.  Subjective fever, no chills.  Urinating less than normal.  Has been unable to eat over the past 24 hours secondary to discomfort.  Past Medical History Past Medical History:  Diagnosis Date   Ascending aortic aneurysm (HCC)    4.7 cm. 12/2018 imaging->stable. 02/2020 CT angio/aortoa slight progression->cardiol ref'd him to CT surg   Cancer [facility])    colon cancers per doctor's notes   Chronic renal insufficiency, stage 3 (moderate) (HCC)    GFR 55 ml/min   Cirrhosis, nonalcoholic (HCC)    NAFLD   Diverticulitis    Essential hypertension    Frequent headaches    GERD (gastroesophageal reflux disease)    History of colon cancer 2015   Surgery; but no chemo or rad was required.  Had a total of 5 surgeries, hx of colostomy, then an ileostomy.   Hx of migraines    Hyperlipemia, mixed    Insomnia    Lateral epicondylitis    Recurrent, right: steroid  injection by Dr. Junius Roads Sept and Nov 2020.   Mild intermittent asthma    Obesity, Class I, BMI 30-34.9    Sleep apnea    Patient Active Problem List   Diagnosis Date Noted   CKD (chronic kidney disease) stage 3, GFR 30-59 ml/min (HCC) 05/13/2022   GERD (gastroesophageal reflux disease) 05/13/2022   OSA (obstructive sleep apnea) 05/13/2022   History of colon cancer 05/13/2022   Cholelithiasis with concern for choledocholithiasis 05/13/2022   Polycythemia 05/13/2022   Thrombocytopenia ([place]) 05/13/2022   Palpitations 08/21/2020   Precordial chest pain 06/23/2020   Chronic fatigue 06/23/2020   S/P aortic aneurysm repair 04/01/2020   Dyslipidemia 03/25/2020   Ascending aortic aneurysm s/p repair 06/07/2019   Essential hypertension 06/07/2019   Home Medication(s) Prior to Admission medications   Medication Sig Start Date End Date Taking? Authorizing Provider  acetaminophen (TYLENOL) 500 MG tablet Take 1,000 mg by mouth every 6 (six) hours as needed for moderate pain.   Yes [provider]  cyanocobalamin (VITAMIN B12) 1000 MCG/ML injection Inject 1 mL (1,000 mcg total) into the muscle every 30 (thirty) days. 03/21/22  Yes McGowen, Adrian Blackwater, MD  dicyclomine (BENTYL) 20 MG tablet Take 1 tablet (20 mg total) by mouth every 12 (twelve) hours as needed (for abdominal pain). 05/12/22  Yes Antonietta Breach, PA-C  fenofibrate (TRICOR) 145 MG tablet Take 1 tablet (145 mg total) by mouth daily. 05/12/22  Yes [name], MD  icosapent Ethyl (VASCEPA) 1 g capsule Take 1 capsule (1 g total) by mouth 2 (two) times daily. 04/25/22  Yes  [name], MD  Ipratropium-Albuterol (COMBIVENT) 20-100 MCG/ACT AERS respimat Inhale 1 puff into the lungs 4 (four) times daily. Patient taking differently: Inhale 1 puff into the lungs every 4 (four) hours as needed for wheezing or shortness of breath. 05/07/20 05/13/22 Yes Wonda Olds, MD  metoprolol succinate (TOPROL-XL) 50 MG 24 hr tablet Take 1 tablet (50  mg total) by mouth daily. Take with or immediately following a meal. 05/12/22  Yes Hochrein, Jeneen Rinks, MD  Multiple Vitamin (MULTIVITAMIN WITH MINERALS) TABS tablet Take 1 tablet by mouth daily. Men's One A Day   Yes [provider]  omeprazole (PRILOSEC) 20 MG capsule Take 1 capsule (20 mg total) by mouth daily. 03/17/21  Yes [name], MD  ondansetron (ZOFRAN-ODT) 4 MG disintegrating tablet Take 1 tablet (4 mg total) by mouth every 8 (eight) hours as needed for nausea or vomiting. 05/12/22  Yes Antonietta Breach, PA-C  oxyCODONE-acetaminophen (PERCOCET/ROXICET) 5-325 MG tablet Take 1-2 tablets by mouth every 6 (six) hours as needed for severe pain. 05/12/22  Yes Antonietta Breach, PA-C  oxymetazoline (AFRIN) 0.05 % nasal spray Place 1 spray into both nostrils daily as needed for congestion.   Yes [provider]  rosuvastatin (CRESTOR) 20 MG tablet TAKE 1 TABLET BY MOUTH EVERY DAY 03/29/22  Yes [name], MD  valsartan (DIOVAN) 160 MG tablet Take 1 tablet (160 mg total) by mouth 2 (two) times daily. Patient taking differently: Take 160 mg by mouth daily. 04/25/22  Yes [name], MD  zolpidem (AMBIEN) 10 MG tablet Take 10 mg by mouth at bedtime as needed for sleep.   Yes [provider]                                                                                                                                    Past Surgical History Past Surgical History:  Procedure Laterality Date   ABI's  03/2019   Normal   CARDIAC CATHETERIZATION  03/30/2020   No CAD.   Cardiac rhythm monitoring  07/20/2020   ZIO patch x 3d-->no arrhythmias   COLON SURGERY  2015   2 cm colon ca excised.   COLONOSCOPY  many   he has had colonoscopy q35mosince 2015.  Most recent was approx 04/2018->normal.   COLOSTOMY  2015   diverticulitis    ILEOSTOMY  2016   RIGHT/LEFT HEART CATH AND CORONARY ANGIOGRAPHY N/A 03/30/2020   Procedure: RIGHT/LEFT HEART CATH AND CORONARY ANGIOGRAPHY;  Surgeon:  VJettie Booze MD;  [facility];  Service: Cardiovascular;  Laterality: N/A;   TEE WITHOUT CARDIOVERSION N/A 04/01/2020   Procedure: TRANSESOPHAGEAL ECHOCARDIOGRAM (TEE);  Surgeon: AWonda Olds MD;  [place] [facility]  Service: Open Heart Surgery;  Laterality: N/A;   THORACIC AORTIC ANEURYSM REPAIR N/A 04/01/2020   Procedure: THORACIC ASCENDING ANEURYSM REPAIR (AAA) USING HEMASHIELD PLATINUM 30 MM VASCULAR SHIELD.;  Surgeon: AWonda Olds MD;  Location: [facility];  Service: Open Heart Surgery;  Laterality: N/A;   THORACIC AORTOGRAM N/A 03/30/2020   Procedure: THORACIC AORTOGRAM;  Surgeon: Jettie Booze, MD;  Location: [place];  Service: Cardiovascular;  Laterality: N/A;   TRANSTHORACIC ECHOCARDIOGRAM  03/23/2020   49 mm ascend aort aneur.  EF 55-60%, grd II DD, valves normal.   Family History Family History  Problem Relation Age of Onset   Lymphoma Mother    Cancer Father        ureter   Stomach cancer Father    Heart disease Father 62       Stents   High blood pressure Father    High Cholesterol Father    Thyroid cancer Sister     Social History Social History   Tobacco Use   Smoking status: Former    Types: Cigarettes    Quit date: 12/31/1988    Years since quitting: 33.3   Smokeless tobacco: Never  Substance Use Topics   Alcohol use: Yes    Comment: rarely   Allergies Morphine and related, Penicillins, and Toradol [ketorolac tromethamine]  Review of Systems Review of Systems  Constitutional:  Positive for chills and fever.  HENT:  Negative for facial swelling and trouble swallowing.   Eyes:  Negative for photophobia and visual disturbance.  Respiratory:  Negative for cough and shortness of breath.   Cardiovascular:  Negative for chest pain and palpitations.  Gastrointestinal:  Positive for abdominal pain and nausea. Negative for diarrhea and vomiting.  Endocrine: Negative for polydipsia and polyuria.  Genitourinary:   Positive for decreased urine volume. Negative for difficulty urinating and hematuria.  Musculoskeletal:  Negative for gait problem and joint swelling.  Skin:  Negative for pallor and rash.  Neurological:  Negative for syncope and headaches.  Psychiatric/Behavioral:  Negative for agitation and confusion.     Physical Exam Vital Signs  I have reviewed the triage vital signs BP (!) 165/93   Pulse 97   Temp 98.7 F (37.1 C) (Oral)   Resp (!) 26   Ht '6\' 2"'$  (1.88 m)   Wt 102 kg   SpO2 93%   BMI 28.87 kg/m  Physical Exam Vitals and nursing note reviewed.  Constitutional:      General: He is not in acute distress.    Appearance: He is well-developed. He is not diaphoretic.     Comments: Appears uncomfortable  HENT:     Head: Normocephalic and atraumatic.     Right Ear: External ear normal.     Left Ear: External ear normal.     Mouth/Throat:     Mouth: Mucous membranes are moist.  Eyes:     General: Scleral icterus present.  Cardiovascular:     Rate and Rhythm: Normal rate and regular rhythm.     Pulses: Normal pulses.     Heart sounds: Normal heart sounds.  Pulmonary:     Effort: Pulmonary effort is normal. No respiratory distress.     Breath sounds: Normal breath sounds. No wheezing.  Abdominal:     General: Abdomen is flat.     Palpations: Abdomen is soft.     Tenderness: There is abdominal tenderness in the right upper quadrant and epigastric area. There is guarding. There is no rebound.       Comments: Not peritoneal Multiple surgical scars noted abdomen  Musculoskeletal:     Cervical back: No rigidity.     Right lower leg: No edema.  Left lower leg: No edema.  Skin:    General: Skin is warm and dry.     Capillary Refill: Capillary refill takes less than 2 seconds.  Neurological:     Mental Status: He is alert and oriented to person, place, and time.  Psychiatric:        Mood and Affect: Mood normal.        Behavior: Behavior normal.     ED Results and  Treatments Labs (all labs ordered are listed, but only abnormal results are displayed) Labs Reviewed  LIPASE, BLOOD - Abnormal; Notable for the following components:      Result Value   Lipase 661 (*)    All other components within normal limits  COMPREHENSIVE METABOLIC PANEL - Abnormal; Notable for the following components:   Glucose, Bld 106 (*)    Creatinine, Ser 1.33 (*)    Calcium 8.7 (*)    Total Protein 6.4 (*)    AST 343 (*)    ALT 246 (*)    Alkaline Phosphatase 336 (*)    Total Bilirubin 8.7 (*)    All other components within normal limits  CBC - Abnormal; Notable for the following components:   RBC 6.22 (*)    Hemoglobin 17.3 (*)    HCT 52.1 (*)    RDW 17.6 (*)    Platelets 142 (*)    All other components within normal limits  URINALYSIS, ROUTINE W REFLEX MICROSCOPIC - Abnormal; Notable for the following components:   Color, Urine AMBER (*)    Bilirubin Urine MODERATE (*)    All other components within normal limits  PROTIME-INR  COMPREHENSIVE METABOLIC PANEL  CBC  LIPASE, BLOOD  HIV ANTIBODY (ROUTINE TESTING W REFLEX)  DIFFERENTIAL  TECHNOLOGIST SMEAR REVIEW  TYPE AND SCREEN                                                                                                                          Radiology CT ABDOMEN PELVIS W CONTRAST  Result Date: 05/13/2022 CLINICAL DATA:  Epigastric abdominal pain. EXAM: CT ABDOMEN AND PELVIS WITH CONTRAST TECHNIQUE: Multidetector CT imaging of the abdomen and pelvis was performed using the standard protocol following bolus administration of intravenous contrast. RADIATION DOSE REDUCTION: This exam was performed according to the departmental dose-optimization program which includes automated exposure control, adjustment of the mA and/or kV according to patient size and/or use of iterative reconstruction technique. CONTRAST:  77m OMNIPAQUE IOHEXOL 350 MG/ML SOLN COMPARISON:  May 12, 2022. FINDINGS: Lower chest: No acute  abnormality. Hepatobiliary: Cholelithiasis and stable gallbladder distention is noted. No biliary dilatation is noted. Liver is unremarkable. Pancreas: Unremarkable. No pancreatic ductal dilatation or surrounding inflammatory changes. Spleen: Normal in size without focal abnormality. Adrenals/Urinary Tract: Adrenal glands appear normal. Stable small bilateral renal cysts are noted for which no further follow-up is required. No hydronephrosis or renal obstruction is noted. Urinary bladder is unremarkable. Stomach/Bowel: Stomach is within normal limits. Appendix appears normal. No  evidence of bowel wall thickening, distention, or inflammatory changes. Stable postsurgical changes involving distal ileum and sigmoid colon. Vascular/Lymphatic: No significant vascular findings are present. Stable minimally enlarged mesenteric lymph nodes which most likely are reactive in etiology. Reproductive: Prostate is unremarkable. Other: Small fat containing periumbilical hernia. No ascites is noted. Musculoskeletal: No acute or significant osseous findings. IMPRESSION: Mild cholelithiasis and gallbladder is distention is noted which is unchanged compared to prior exam performed yesterday. Biliary dilatation is noted. Gastric distention noted on prior exam of yesterday is resolved. Small bilateral renal cysts are noted which are unchanged compared to prior exam performed yesterday. No further follow-up is required Small fat containing periumbilical hernia is noted which is unchanged compared to prior exam performed yesterday. Electronically Signed   By: Marijo Conception M.D.   On: 05/13/2022 18:37    Pertinent labs & imaging results that were available during my care of the patient were reviewed by me and considered in my medical decision making (see MDM for details).  Medications Ordered in ED Medications  0.9 %  sodium chloride infusion ( Intravenous New Bag/Given 05/13/22 2106)  metoprolol succinate (TOPROL-XL) 24 hr tablet  50 mg (has no administration in time range)  irbesartan (AVAPRO) tablet 150 mg (has no administration in time range)  zolpidem (AMBIEN) tablet 10 mg (has no administration in time range)  oxymetazoline (AFRIN) 0.05 % nasal spray 1 spray (has no administration in time range)  ipratropium-albuterol (DUONEB) 0.5-2.5 (3) MG/3ML nebulizer solution 3 mL (has no administration in time range)  HYDROmorphone (DILAUDID) injection 1 mg (has no administration in time range)  ondansetron (ZOFRAN) tablet 4 mg (has no administration in time range)    Or  ondansetron (ZOFRAN) injection 4 mg (has no administration in time range)  pantoprazole (PROTONIX) injection 40 mg (has no administration in time range)  HYDROmorphone (DILAUDID) injection 1 mg (1 mg Intravenous Given 05/13/22 1504)  ondansetron (ZOFRAN) injection 4 mg (4 mg Intravenous Given 05/13/22 1501)  sodium chloride 0.9 % bolus 1,000 mL (0 mLs Intravenous Stopped 05/13/22 1803)  pantoprazole (PROTONIX) injection 40 mg (40 mg Intravenous Given 05/13/22 1603)  dicyclomine (BENTYL) capsule 10 mg (10 mg Oral Given 05/13/22 1604)  ondansetron (ZOFRAN) injection 4 mg (4 mg Intravenous Given 05/13/22 1644)  HYDROmorphone (DILAUDID) injection 1 mg (1 mg Intravenous Given 05/13/22 1807)  sodium chloride 0.9 % bolus 1,000 mL (0 mLs Intravenous Stopped 05/13/22 1953)  iohexol (OMNIPAQUE) 350 MG/ML injection 75 mL (75 mLs Intravenous Contrast Given 05/13/22 1822)  ondansetron (ZOFRAN) injection 4 mg (4 mg Intravenous Given 05/13/22 2000)  HYDROmorphone (DILAUDID) injection 1 mg (1 mg Intravenous Given 05/13/22 2105)                                                                                                                                     Procedures .Critical Care  Performed by: Jeanell Sparrow, DO Authorized by: Pearline Cables,  Arlan Organ, DO   Critical care provider statement:    Critical care time (minutes):  30   Critical care time was exclusive of:  Separately  billable procedures and treating other patients   Critical care was necessary to treat or prevent imminent or life-threatening deterioration of the following conditions: intractable pain.   Critical care was time spent personally by me on the following activities:  Development of treatment plan with patient or surrogate, discussions with consultants, evaluation of patient's response to treatment, examination of patient, ordering and review of laboratory studies, ordering and review of radiographic studies, ordering and performing treatments and interventions, pulse oximetry, re-evaluation of patient's condition, review of old charts and obtaining history from patient or surrogate   Care discussed with: admitting provider     (including critical care time)  Medical Decision Making / ED Course   MDM:  ERON STAAT is a 52 y.o. male with past medical history as below, significant for send aortic aneurysm s/p repair 2021, CKD, NASH, hypertension, GERD, HLD, asthma, sleep apnea, multiple abdominal surgeries who presents to the ED with complaint of right upper quadrant, epigastric pain.. The complaint involves an extensive differential diagnosis and also carries with it a high risk of complications and morbidity.  Serious etiology was considered. Ddx includes but is not limited to: Differential diagnosis includes but is not exclusive to acute cholecystitis, intrathoracic causes for epigastric abdominal pain, gastritis, duodenitis, pancreatitis, small bowel or large bowel obstruction, abdominal aortic aneurysm, hernia, gastritis, etc.  Patient was seen yesterday evening, he was noted to have gallstones and distended gallbladder on imaging,  On initial assessment the patient is: Appears uncomfortable, epigastric, right upper quad abdominal pain on exam, not peritoneal.  Obtain screening labs and give analgesia. Vital signs and nursing notes were reviewed  Clinical Course as of 05/13/22 2142  Sat  May 13, 2022  1623 Lipase(!): 661 Lipase was normal yest [SG]  1623 AST(!): 343 [SG]  1623 ALT(!): 246 [SG]  1623 Alkaline Phosphatase(!): 336 [SG]  1623 Total Bilirubin(!): 8.7 Worsening since yestd [SG]  7673 Patient worsening LFTs, worsening bili, concern for cholecystitis, obstructive biliary pathology.  Will repeat CT imaging given worsening clinical picture [SG]  1908 CTAP reviewed, cholelithiasis, no CBD dilation per radiology. [SG]  [id number] Spoke with Dr. Michaelle Birks, recommends MRCP, medical admission.  They will come see in consult. [SG]  2023 Spoke with hospitalist Dr. Rogers Blocker who acceptes patient for admission [SG]    Clinical Course User Index [SG] Jeanell Sparrow, DO   Patient admitted to [facility].  Symptoms improved.  He is HDS at time of admission.  Admitted to medical service with general surgery on consult pending MRCP findings.  Per surgery if patient does develop a fever recommend starting antibiotics for ?cholangitis.   Additional history obtained: -Additional history obtained from family -External records from outside source obtained and reviewed including: Chart review including previous notes, labs, imaging, consultation notes including recent ED visit, prior labs and imaging, home medications, OSH records   Lab Tests: -I ordered, reviewed, and interpreted labs.   The pertinent results include:   Labs Reviewed  LIPASE, BLOOD - Abnormal; Notable for the following components:      Result Value   Lipase 661 (*)    All other components within normal limits  COMPREHENSIVE METABOLIC PANEL - Abnormal; Notable for the following components:   Glucose, Bld 106 (*)    Creatinine, Ser 1.33 (*)    Calcium 8.7 (*)  Total Protein 6.4 (*)    AST 343 (*)    ALT 246 (*)    Alkaline Phosphatase 336 (*)    Total Bilirubin 8.7 (*)    All other components within normal limits  CBC - Abnormal; Notable for the following components:   RBC 6.22 (*)    Hemoglobin 17.3 (*)    HCT  52.1 (*)    RDW 17.6 (*)    Platelets 142 (*)    All other components within normal limits  URINALYSIS, ROUTINE W REFLEX MICROSCOPIC - Abnormal; Notable for the following components:   Color, Urine AMBER (*)    Bilirubin Urine MODERATE (*)    All other components within normal limits  PROTIME-INR  COMPREHENSIVE METABOLIC PANEL  CBC  LIPASE, BLOOD  HIV ANTIBODY (ROUTINE TESTING W REFLEX)  DIFFERENTIAL  TECHNOLOGIST SMEAR REVIEW  TYPE AND SCREEN    Notable for as above  EKG   EKG Interpretation  Date/Time:  Saturday May 13 2022 21:38:11 EST Ventricular Rate:  82 PR Interval:  165 QRS Duration: 83 QT Interval:  363 QTC Calculation: 424 R Axis:   37 Text Interpretation: Sinus rhythm Probable left atrial enlargement Borderline T abnormalities, anterior leads Confirmed by Wynona Dove ([id number]) on 05/13/2022 9:40:32 PM         Imaging Studies ordered: I ordered imaging studies including CT abdomen I independently visualized the following imaging with scope of interpretation limited to determining acute life threatening conditions related to emergency care: As above I independently visualized and interpreted imaging. I agree with the radiologist interpretation   Medicines ordered and prescription drug management: Meds ordered this encounter  Medications   HYDROmorphone (DILAUDID) injection 1 mg   ondansetron (ZOFRAN) injection 4 mg   sodium chloride 0.9 % bolus 1,000 mL   pantoprazole (PROTONIX) injection 40 mg   dicyclomine (BENTYL) capsule 10 mg   ondansetron (ZOFRAN) injection 4 mg   HYDROmorphone (DILAUDID) injection 1 mg   sodium chloride 0.9 % bolus 1,000 mL   iohexol (OMNIPAQUE) 350 MG/ML injection 75 mL   ondansetron (ZOFRAN) injection 4 mg   HYDROmorphone (DILAUDID) injection 1 mg   0.9 %  sodium chloride infusion   metoprolol succinate (TOPROL-XL) 24 hr tablet 50 mg    Take with or immediately following a meal.     irbesartan (AVAPRO) tablet 150 mg    zolpidem (AMBIEN) tablet 10 mg   DISCONTD: Ipratropium-Albuterol (COMBIVENT) respimat 1 puff   oxymetazoline (AFRIN) 0.05 % nasal spray 1 spray   ipratropium-albuterol (DUONEB) 0.5-2.5 (3) MG/3ML nebulizer solution 3 mL   HYDROmorphone (DILAUDID) injection 1 mg   DISCONTD: lactated ringers infusion   OR Linked Order Group    ondansetron (ZOFRAN) tablet 4 mg    ondansetron (ZOFRAN) injection 4 mg   pantoprazole (PROTONIX) injection 40 mg    -I have reviewed the patients home medicines and have made adjustments as needed   Consultations Obtained: I requested consultation with the gen surg,  and discussed lab and imaging findings as well as pertinent plan - they recommend: admit/mrcp   Cardiac Monitoring: The patient was maintained on a cardiac monitor.  I personally viewed and interpreted the cardiac monitored which showed an underlying rhythm of: nsr  Social Determinants of Health:  Diagnosis or treatment significantly limited by social determinants of health: former smoker   Reevaluation: After the interventions noted above, I reevaluated the patient and found that they have improved  Co morbidities that complicate the patient evaluation  Past  Medical History:  Diagnosis Date   Ascending aortic aneurysm ([place])    4.7 cm. 12/2018 imaging->stable. 02/2020 CT angio/aortoa slight progression->cardiol ref'd him to CT surg   Cancer [facility])    colon cancers per doctor's notes   Chronic renal insufficiency, stage 3 (moderate) (HCC)    GFR 55 ml/min   Cirrhosis, nonalcoholic (HCC)    NAFLD   Diverticulitis    Essential hypertension    Frequent headaches    GERD (gastroesophageal reflux disease)    History of colon cancer 2015   Surgery; but no chemo or rad was required.  Had a total of 5 surgeries, hx of colostomy, then an ileostomy.   Hx of migraines    Hyperlipemia, mixed    Insomnia    Lateral epicondylitis    Recurrent, right: steroid injection by Dr. Junius Roads Sept and Nov 2020.    Mild intermittent asthma    Obesity, Class I, BMI 30-34.9    Sleep apnea       Dispostion: Disposition decision including need for hospitalization was considered, and patient admitted to the hospital.    Final Clinical Impression(s) / ED Diagnoses Final diagnoses:  Symptomatic cholelithiasis  RUQ pain  Transaminitis  Hyperbilirubinemia  Intractable abdominal pain     This chart was dictated using voice recognition software.  Despite best efforts to proofread,  errors can occur which can change the documentation meaning.    Jeanell Sparrow, DO 05/13/22 2142    Jeanell Sparrow, DO 05/13/22 2142

## 2022-05-13 NOTE — Consult Note (Signed)
Clayton Hernandez Shade Flood May 31, 1970  [id number].    Requesting MD: Dr. Wynona Dove Chief Complaint/Reason for Consult: gallstones, elevated LFTs  HPI:  Mr. Muto is a 52 yo male who presented to the ED with worsening epigastric abdominal pain. The pain began yesterday, and he was seen in the ED yesterday at which time a RUQ [place] and CT scan showed gallstones, without signs of cholecystitis. Bilirubin was 2.4 with a mild elevation in transaminases. He was discharged with outpatient follow up, however his pain has persisted and he returned to the ED today. Tbili is now 8.7, with alk phos 336 and AST/ALT 343/246. Lipase is 661. A CT scan again showed gallstones without signs of acute cholecystitis. General surgery was consulted. ***  The patient has had multiple prior abdominal surgeries including a colonic resection in ***.   ROS: ROS  Family History  Problem Relation Age of Onset   Lymphoma Mother    Cancer Father        ureter   Stomach cancer Father    Heart disease Father 37       Stents   High blood pressure Father    High Cholesterol Father    Thyroid cancer Sister     Past Medical History:  Diagnosis Date   Ascending aortic aneurysm ([place])    4.7 cm. 12/2018 imaging->stable. 02/2020 CT angio/aortoa slight progression->cardiol ref'd him to CT surg   Cancer [facility])    colon cancers per doctor's notes   Chronic renal insufficiency, stage 3 (moderate) (HCC)    GFR 55 ml/min   Cirrhosis, nonalcoholic (HCC)    NAFLD   Diverticulitis    Essential hypertension    Frequent headaches    GERD (gastroesophageal reflux disease)    History of colon cancer 2015   Surgery; but no chemo or rad was required.  Had a total of 5 surgeries, hx of colostomy, then an ileostomy.   Hx of migraines    Hyperlipemia, mixed    Insomnia    Lateral epicondylitis    Recurrent, right: steroid injection by Dr. Junius Roads Sept and Nov 2020.   Mild intermittent asthma    Obesity, Class I, BMI 30-34.9     Sleep apnea     Past Surgical History:  Procedure Laterality Date   ABI's  03/2019   Normal   CARDIAC CATHETERIZATION  03/30/2020   No CAD.   Cardiac rhythm monitoring  07/20/2020   ZIO patch x 3d-->no arrhythmias   COLON SURGERY  2015   2 cm colon ca excised.   COLONOSCOPY  many   he has had colonoscopy q69mosince 2015.  Most recent was approx 04/2018->normal.   COLOSTOMY  2015   diverticulitis    ILEOSTOMY  2016   RIGHT/LEFT HEART CATH AND CORONARY ANGIOGRAPHY N/A 03/30/2020   Procedure: RIGHT/LEFT HEART CATH AND CORONARY ANGIOGRAPHY;  Surgeon: VJettie Booze MD;  [facility];  Service: Cardiovascular;  Laterality: N/A;   TEE WITHOUT CARDIOVERSION N/A 04/01/2020   Procedure: TRANSESOPHAGEAL ECHOCARDIOGRAM (TEE);  Surgeon: AWonda Olds MD;  [place] [facility]  Service: Open Heart Surgery;  Laterality: N/A;   THORACIC AORTIC ANEURYSM REPAIR N/A 04/01/2020   Procedure: THORACIC ASCENDING ANEURYSM REPAIR (AAA) USING HEMASHIELD PLATINUM 30 MM VASCULAR SHIELD.;  Surgeon: AWonda Olds MD;  [place] [facility]  Service: Open Heart Surgery;  Laterality: N/A;   THORACIC AORTOGRAM N/A 03/30/2020   Procedure: THORACIC AORTOGRAM;  Surgeon: VJettie Booze MD;  [facility];  Service: Cardiovascular;  Laterality: N/A;   TRANSTHORACIC ECHOCARDIOGRAM  03/23/2020   49 mm ascend aort aneur.  EF 55-60%, grd II DD, valves normal.    Social History:  reports that he quit smoking about 33 years ago. His smoking use included cigarettes. He has never used smokeless tobacco. He reports current alcohol use. No history on file for drug use.  Allergies:  Allergies  Allergen Reactions   Morphine And Related Other (See Comments)    "all opiates" - "they'll kill me"   Penicillins Anaphylaxis    "All cillins"   Toradol [Ketorolac Tromethamine] Other (See Comments)    Unknown reaction (possible rash)    (Not in a hospital admission)    Physical  Exam: Blood pressure (!) 157/94, pulse 89, temperature 100.3 F (37.9 C), temperature source Oral, resp. rate (!) 23, height '6\' 2"'$  (1.88 m), weight 102 kg, SpO2 98 %. ***  Results for orders placed or performed during the hospital encounter of 05/13/22 (from the past 48 hour(s))  Lipase, blood     Status: Abnormal   Collection Time: 05/13/22  2:39 PM  Result Value Ref Range   Lipase 661 (H) 11 - 51 U/L    Comment: RESULT CONFIRMED BY MANUAL DILUTION Performed at [facility] [address] [place], [place] [address]   Comprehensive metabolic panel     Status: Abnormal   Collection Time: 05/13/22  2:39 PM  Result Value Ref Range   Sodium 136 135 - 145 mmol/L   Potassium 3.8 3.5 - 5.1 mmol/L   Chloride 104 98 - 111 mmol/L   CO2 22 22 - 32 mmol/L   Glucose, Bld 106 (H) 70 - 99 mg/dL    Comment: Glucose reference range applies only to samples taken after fasting for at least 8 hours.   BUN 14 6 - 20 mg/dL   Creatinine, Ser 1.33 (H) 0.61 - 1.24 mg/dL   Calcium 8.7 (L) 8.9 - 10.3 mg/dL   Total Protein 6.4 (L) 6.5 - 8.1 g/dL   Albumin 3.6 3.5 - 5.0 g/dL   AST 343 (H) 15 - 41 U/L   ALT 246 (H) 0 - 44 U/L   Alkaline Phosphatase 336 (H) 38 - 126 U/L   Total Bilirubin 8.7 (H) 0.3 - 1.2 mg/dL    Comment: DELTA CHECK NOTED   GFR, Estimated >60 >60 mL/min    Comment: (NOTE) Calculated using the CKD-EPI Creatinine Equation (2021)    Anion gap 10 5 - 15    Comment: Performed at [facility], [place] [address] [place], [place] [address]  CBC     Status: Abnormal   Collection Time: 05/13/22  2:39 PM  Result Value Ref Range   WBC 7.9 4.0 - 10.5 K/uL   RBC 6.22 (H) 4.22 - 5.81 MIL/uL   Hemoglobin 17.3 (H) 13.0 - 17.0 g/dL   HCT 52.1 (H) 39.0 - 52.0 %   MCV 83.8 80.0 - 100.0 fL   MCH 27.8 26.0 - 34.0 pg   MCHC 33.2 30.0 - 36.0 g/dL   RDW 17.6 (H) 11.5 - 15.5 %   Platelets 142 (L) 150 - 400 K/uL   nRBC 0.0 0.0 - 0.2 %    Comment: Performed at [facility]  [address], [place],  [address]  Urinalysis, Routine w reflex microscopic -Urine, Clean Catch     Status: Abnormal   Collection Time: 05/13/22  2:49 PM  Result  Value Ref Range   Color, Urine AMBER (A) YELLOW    Comment: BIOCHEMICALS MAY BE AFFECTED BY COLOR   APPearance CLEAR CLEAR   Specific Gravity, Urine 1.023 1.005 - 1.030   pH 5.0 5.0 - 8.0   Glucose, UA NEGATIVE NEGATIVE mg/dL   Hgb urine dipstick NEGATIVE NEGATIVE   Bilirubin Urine MODERATE (A) NEGATIVE   Ketones, ur NEGATIVE NEGATIVE mg/dL   Protein, ur NEGATIVE NEGATIVE mg/dL   Nitrite NEGATIVE NEGATIVE   Leukocytes,Ua NEGATIVE NEGATIVE    Comment: Performed at [facility] [address], [place], [place] [address]   CT ABDOMEN PELVIS W CONTRAST  Result Date: 05/13/2022 CLINICAL DATA:  Epigastric abdominal pain. EXAM: CT ABDOMEN AND PELVIS WITH CONTRAST TECHNIQUE: Multidetector CT imaging of the abdomen and pelvis was performed using the standard protocol following bolus administration of intravenous contrast. RADIATION DOSE REDUCTION: This exam was performed according to the departmental dose-optimization program which includes automated exposure control, adjustment of the mA and/or kV according to patient size and/or use of iterative reconstruction technique. CONTRAST:  71m OMNIPAQUE IOHEXOL 350 MG/ML SOLN COMPARISON:  May 12, 2022. FINDINGS: Lower chest: No acute abnormality. Hepatobiliary: Cholelithiasis and stable gallbladder distention is noted. No biliary dilatation is noted. Liver is unremarkable. Pancreas: Unremarkable. No pancreatic ductal dilatation or surrounding inflammatory changes. Spleen: Normal in size without focal abnormality. Adrenals/Urinary Tract: Adrenal glands appear normal. Stable small bilateral renal cysts are noted for which no further follow-up is required. No hydronephrosis or renal obstruction is noted. Urinary bladder is unremarkable. Stomach/Bowel: Stomach is within normal limits. Appendix  appears normal. No evidence of bowel wall thickening, distention, or inflammatory changes. Stable postsurgical changes involving distal ileum and sigmoid colon. Vascular/Lymphatic: No significant vascular findings are present. Stable minimally enlarged mesenteric lymph nodes which most likely are reactive in etiology. Reproductive: Prostate is unremarkable. Other: Small fat containing periumbilical hernia. No ascites is noted. Musculoskeletal: No acute or significant osseous findings. IMPRESSION: Mild cholelithiasis and gallbladder is distention is noted which is unchanged compared to prior exam performed yesterday. Biliary dilatation is noted. Gastric distention noted on prior exam of yesterday is resolved. Small bilateral renal cysts are noted which are unchanged compared to prior exam performed yesterday. No further follow-up is required Small fat containing periumbilical hernia is noted which is unchanged compared to prior exam performed yesterday. Electronically Signed   By: JMarijo ConceptionM.D.   On: 05/13/2022 18:37   CT Angio Chest/Abd/Pel for Dissection W and/or Wo Contrast  Result Date: 05/12/2022 CLINICAL DATA:  Presents with epigastric pain and chest pain. History of 04/01/2020 ascending aortic aneurysm repair need to check for dissection or recurring aneurysm. EXAM: CT ANGIOGRAPHY CHEST, ABDOMEN AND PELVIS TECHNIQUE: Non-contrast CT of the chest was initially obtained. Multidetector CT imaging through the chest, abdomen and pelvis was performed using the standard protocol during bolus administration of intravenous contrast. Multiplanar reconstructed images and MIPs were obtained and reviewed to evaluate the vascular anatomy. RADIATION DOSE REDUCTION: This exam was performed according to the departmental dose-optimization program which includes automated exposure control, adjustment of the mA and/or kV according to patient size and/or use of iterative reconstruction technique. CONTRAST:  1088m OMNIPAQUE IOHEXOL 350 MG/ML SOLN COMPARISON:  Preoperative CTA chest 02/19/2020. FINDINGS: CTA CHEST FINDINGS Cardiovascular: There are no visible coronary or aortic calcifications. There are intact median sternotomy sutures, well-healed median sternotomy, and postsurgical changes compatible with interval sleeve repair of the previously noted 4.7 cm ascending aortic aneurysm. There is mild  descending aortic tortuosity. The great vessels are clear. There is no aortic dissection or stenosis. Post repair the aorta is within normal caliber limits except for a mild dilatation in the root at the sinuses of Valsalva where the diameter is 4.2 cm, previously 4.0 cm. There is mild panchamber cardiomegaly. No pericardial effusion is seen. The pulmonary veins are decompressed but there is prominence of the pulmonary trunk, which measures 5.6 cm consistent with arterial hypertension, previously measuring 3.1 cm. No arterial embolism is seen or findings of acute right heart strain. Mediastinum/Nodes: No enlarged mediastinal, hilar, or axillary lymph nodes. Thyroid gland, trachea, and esophagus demonstrate no significant findings. Mild lipomatosis is again noted in the superior mediastinum, increased. Asymmetric elevation of the right hemidiaphragm is unchanged. Lungs/Pleura: No pleural effusion, thickening or pneumothorax. There is mild posterior atelectasis. There is mild bronchial thickening diffusely without visible bronchial plugging. There are no pulmonary infiltrate or nodule. Musculoskeletal: No acute or significant osseous findings. No chest wall mass. Review of the MIP images confirms the above findings. CTA ABDOMEN AND PELVIS FINDINGS VASCULAR Aorta: Normal caliber aorta without aneurysm, dissection, vasculitis or significant stenosis. There are trace calcific plaques distally. Celiac: Normal. SMA: Normal. Renals: Both are single.  Both are normal. IMA: Normal. Inflow: Patent without evidence of aneurysm, dissection,  vasculitis or significant stenosis. There are minimal calcific plaques in both common iliac arteries. Veins: Unopacified and not evaluated. Review of the MIP images confirms the above findings. NON-VASCULAR Hepatobiliary: The liver is 18.5 cm in length with mild-to-moderate steatosis. A flash filling hemangioma, again measuring 1 cm, is redemonstrated in the posterior segment of the right lobe on 7:125. No other focal abnormality is seen. Gallbladder is dilated to 10.5 cm. There are multiple small stones posteriorly. Equivocal wall thickening on the coronal reformatting. Correlate clinically for cholecystitis. Ultrasound may be helpful. There is no biliary dilatation. Pancreas: No abnormality. Spleen: Mild splenomegaly, splenic AP diameter 14.7 cm, previously 13.9 cm. No mass. Adrenals/Urinary Tract: There is no adrenal mass. There is homogeneous thin walled 1.6 cm cyst in the posterior aspect of the right kidney. Hounsfield density is 8.3. No follow-up imaging is recommended. There is a 1.1 cm homogeneous cyst in the posterior upper left kidney, Hounsfield density is 2. No follow-up imaging is recommended. The rest of both kidneys enhance homogeneously. There is no urinary stone or obstruction. The bladder unremarkable for the degree of distention. Stomach/Bowel: Stomach distended with fluid and food products. No wall thickening or duodenal dilatation. This could reflect changes due to a recent ingestion or due to impaired gastric emptying. There is no small bowel obstruction or inflammation. There is postsurgical change of ileocecal junction compatible with a distal ileectomy and side-to-side anastomosis. The appendix remains and is normal caliber. There is diverticulosis. There is hazy reaction along side the junction of the descending and sigmoid colon which could be due to a mild diverticulitis or fat scarring from old diverticular disease. More distally there is a rectosigmoid colocolic surgical anastomosis  which is well patent. Lymphatic: There are multiple borderline to minimally prominent mesenteric lymph nodes in the root fat and a few in the right lower quadrant go Ward haziness in the mesenteric root fat. Query mesenteric adenitis/panniculitis or fibrosing mesenteritis. No further adenopathy. Reproductive: Prostate is unremarkable. Other: Small umbilical fat hernia. No incarcerated hernias. No free air, free fluid or hemorrhage Musculoskeletal: No acute or significant osseous findings. Review of the MIP images confirms the above findings. IMPRESSION: 1. No findings of acute aortic  syndrome. 2. Postsurgical changes of the ascending aorta with mild dilatation in the root at the sinuses of Valsalva where the diameter is 4.2 cm, previously 4.0 cm. The remainder is within normal caliber limits post repair. Recommend annual imaging followup by CTA or MRA. This recommendation follows 2010 ACCF/AHA/AATS/ACR/ASA/SCA/SCAI/SIR/STS/SVM Guidelines for the Diagnosis and Management of Patients with Thoracic Aortic Disease. Circulation. 2010; 121: Z610-R604. Aortic aneurysm NOS (ICD10-I71.9) 3. Cardiomegaly without evidence of CHF. 4. Prominent pulmonary trunk consistent with arterial hypertension, increased in prominence from 2021. 5. Bronchitis.  No visible focal pneumonia. 6. Distended gallbladder with cholelithiasis and equivocal wall thickening. Correlate clinically for acute cholecystitis. Ultrasound may be helpful. 7. Distended stomach with fluid and food products. This could be due to a recent ingestion or impaired gastric emptying. 8. Diverticulosis with hazy reaction along side the junction of the descending and sigmoid colon, which could be due to a mild diverticulitis or fat scarring from old diverticular disease. No free air or diverticular abscess. 9. Mild hepatosplenomegaly with mild hepatic steatosis and stable 1 cm flash filling hemangioma in the right lobe. 10. Small umbilical fat hernia. 11. Multiple  borderline to minimally prominent mesenteric nodes in the root fat and a few in the right lower quadrant with "misty" mesentery. Query mesenteric adenitis/panniculitis or fibrosing mesenteritis. Electronically Signed   By: Telford Nab M.D.   On: 05/12/2022 04:18   US Abdomen Limited  Result Date: 05/12/2022 CLINICAL DATA:  Right upper quadrant abdominal pain. EXAM: ULTRASOUND ABDOMEN LIMITED RIGHT UPPER QUADRANT COMPARISON:  None Available. FINDINGS: Gallbladder: There multiple gallstones. No gallbladder wall thickening or pericholecystic fluid. Negative sonographic Murphy's sign. Common bile duct: Diameter: 5 mm Liver: There is diffuse increased liver echogenicity most commonly seen in the setting of fatty infiltration. Superimposed inflammation or fibrosis is not excluded. Clinical correlation is recommended. Portal vein is patent on color Doppler imaging with normal direction of blood flow towards the liver. Other: None. IMPRESSION: 1. Cholelithiasis without sonographic evidence of acute cholecystitis. 2. Fatty liver. Electronically Signed   By: Anner Crete M.D.   On: 05/12/2022 02:55      Assessment/Plan 52 yo male with persistent epigastric abdominal pain, gallstones, and an acute rise in LFTs. I have reviewed his labs, imaging and notes. He has gallstones with mild prominence of the biliary tree on CT scan today. His bilirubin is up to 8.7, which is highly suspicious for choledocholithiasis. He does not have signs of acute cholecystitis on imaging.  - MRCP to evaluate for choledocholithiasis. If this is confirmed, consult GI for ERCP. - If patient has fevers, begin antibiotics for cholangitis. - Surgery will follow. Timing of cholecystectomy pending further workup of elevated LFTs.    Michaelle Birks, MD [facility] General, Hepatobiliary and Pancreatic Surgery 05/13/22 7:55 PM

## 2022-05-13 NOTE — Assessment & Plan Note (Signed)
Continue PPI, change to IV

## 2022-05-13 NOTE — Assessment & Plan Note (Signed)
Stable, continue to monitor

## 2022-05-13 NOTE — Assessment & Plan Note (Signed)
Done at [facility] today shows 4.0>4.2 Recommend yearly surveillance

## 2022-05-13 NOTE — Progress Notes (Signed)
Pharmacy Antibiotic Note  Clayton Hernandez is a 52 y.o. male admitted on 05/13/2022 with  intra-abdominal infection .  Pharmacy has been consulted for Cipro dosing. WBC WNL. Scr 1.33.   Plan: Cipro 400 mg IV q12h Flagyl per MD Trend WBC, temp, renal function  F/U infectious work-up  Height: '6\' 2"'$  (188 cm) Weight: 102 kg (224 lb 13.9 oz) IBW/kg (Calculated) : 82.2  Temp (24hrs), Avg:99.6 F (37.6 C), Min:98.7 F (37.1 C), Max:100.3 F (37.9 C)  Recent Labs  Lab 05/12/22 0040 05/12/22 0122 05/13/22 1439  WBC 8.9  --  7.9  CREATININE 1.50* 1.40* 1.33*    Estimated Creatinine Clearance: 81.9 mL/min (A) (by C-G formula based on SCr of 1.33 mg/dL (H)).    Allergies  Allergen Reactions   Morphine And Related Other (See Comments)    "all opiates" - "they'll kill me"   Penicillins Anaphylaxis    "All cillins"   Toradol [Ketorolac Tromethamine] Other (See Comments)    Unknown reaction (possible rash)    Narda Bonds, PharmD, BCPS Clinical Pharmacist Phone: [phone]

## 2022-05-13 NOTE — Assessment & Plan Note (Addendum)
52 year old presenting with acute onset of abdominal pain, jaundice, nausea found to have cholelithiasis with high suspicion for choledocholithiasis  -admit to med-surg -general surgery consulted -CT shows no cholecystitis, but mild cholelithiasis and gallbladder distention. Biliary dilation noted. Concern for stone in bile duct with his jaundice and t.bili to 8.7. lipase at 661.  -MRCP pending -if +, will need GI consult for ERCP -continue NPO except for ice chips and meds -IVF with NS -pain medication with dilaudid as he is in significant pain  -fever to 100.3 on arrival and diaphoretic, start cipro and flagyl to cover and can de escalate if cholecystitis ruled out. Anaphylaxis allergy to PCN

## 2022-05-13 NOTE — Assessment & Plan Note (Signed)
States he has had this "forever" Never seen a hematologist for his polycythemia or thrombocytopenia.  ? Secondary to OSA vs. Pulmonary disease  Continue to trend, recommend f/u with heme/onc outpatient

## 2022-05-13 NOTE — H&P (Signed)
History and Physical    Patient: Clayton Hernandez WCB:[id number] DOB: 31-May-1970 DOA: 05/13/2022 DOS: the patient was seen and examined on 05/13/2022 PCP: [name], MD  Patient coming from: Home - lives with his wife and son.    Chief Complaint: abdominal pain   HPI: Clayton Hernandez is a 52 y.o. male with medical history significant of CKD stage 3, NAFLD, HTN, GERD, hx of colon cancer, HLD, OSA, ascending aortic aneurysm who presented to ED with complaints of abdominal pain.  Came to ED yesterday and diagnosed with gallstones and had outpatient f/u with surgery.  He states he had acute pain yesterday that started at 5pm yesterday around his belly button.  He had associated nausea, no vomiting. Pain 10/10, intensity comes and goes but it's constant pain. The pain became so intense at midnight and they came to ED and he was discharged home.  He went home and woke up this morning and his urine was brown. He then had 5 BMs that were hard and discolored. He then had some blood in his urine and then the pain started to come and caused him to double over and EMS was called.    Denies any fever, but has had chills, vision changes/headaches, chest pain or palpitations, shortness of breath or cough,dysuria or leg swelling.    He does not smoke or drink alcohol.   ER Course:  vitals: temp: 100.3, bp: 153/98, HR: 81, RR: 24, oxygen: 96% on RA Pertinent labs: hgb: 17.3, creatinine: 1.33, AST: 343, ALT: 336, t.bili: 8.7, lipase: 661 CT abdo/pelvis: mild cholelithiasis and gallbladder is distended. Unchanged from yesterday. Biliary dilatation.  In ED: general surgery consulted. Given pain medication, 1L IVF bolus, protonix and tRH asked to admit.     Review of Systems: As mentioned in the history of present illness. All other systems reviewed and are negative. Past Medical History:  Diagnosis Date   Ascending aortic aneurysm (HCC)    4.7 cm. 12/2018 imaging->stable. 02/2020 CT  angio/aortoa slight progression->cardiol ref'd him to CT surg   Cancer [facility])    colon cancers per doctor's notes   Chronic renal insufficiency, stage 3 (moderate) (HCC)    GFR 55 ml/min   Cirrhosis, nonalcoholic (HCC)    NAFLD   Diverticulitis    Essential hypertension    Frequent headaches    GERD (gastroesophageal reflux disease)    History of colon cancer 2015   Surgery; but no chemo or rad was required.  Had a total of 5 surgeries, hx of colostomy, then an ileostomy.   Hx of migraines    Hyperlipemia, mixed    Insomnia    Lateral epicondylitis    Recurrent, right: steroid injection by Dr. Junius Roads Sept and Nov 2020.   Mild intermittent asthma    Obesity, Class I, BMI 30-34.9    Sleep apnea    Past Surgical History:  Procedure Laterality Date   ABI's  03/2019   Normal   CARDIAC CATHETERIZATION  03/30/2020   No CAD.   Cardiac rhythm monitoring  07/20/2020   ZIO patch x 3d-->no arrhythmias   COLON SURGERY  2015   2 cm colon ca excised.   COLONOSCOPY  many   he has had colonoscopy q81mosince 2015.  Most recent was approx 04/2018->normal.   COLOSTOMY  2015   diverticulitis    ILEOSTOMY  2016   RIGHT/LEFT HEART CATH AND CORONARY ANGIOGRAPHY N/A 03/30/2020   Procedure: RIGHT/LEFT HEART CATH AND CORONARY ANGIOGRAPHY;  Surgeon:  Jettie Booze, MD;  Location: [facility];  Service: Cardiovascular;  Laterality: N/A;   TEE WITHOUT CARDIOVERSION N/A 04/01/2020   Procedure: TRANSESOPHAGEAL ECHOCARDIOGRAM (TEE);  Surgeon: Wonda Olds, MD;  Location: [place];  Service: Open Heart Surgery;  Laterality: N/A;   THORACIC AORTIC ANEURYSM REPAIR N/A 04/01/2020   Procedure: THORACIC ASCENDING ANEURYSM REPAIR (AAA) USING HEMASHIELD PLATINUM 30 MM VASCULAR SHIELD.;  Surgeon: Wonda Olds, MD;  Location: [place];  Service: Open Heart Surgery;  Laterality: N/A;   THORACIC AORTOGRAM N/A 03/30/2020   Procedure: THORACIC AORTOGRAM;  Surgeon: Jettie Booze, MD;  Location: [facility];  Service: Cardiovascular;  Laterality: N/A;   TRANSTHORACIC ECHOCARDIOGRAM  03/23/2020   49 mm ascend aort aneur.  EF 55-60%, grd II DD, valves normal.   Social History:  reports that he quit smoking about 33 years ago. His smoking use included cigarettes. He has never used smokeless tobacco. He reports current alcohol use. No history on file for drug use.  Allergies  Allergen Reactions   Morphine And Related Other (See Comments)    "all opiates" - "they'll kill me"   Penicillins Anaphylaxis    "All cillins"   Toradol [Ketorolac Tromethamine] Other (See Comments)    Unknown reaction (possible rash)    Family History  Problem Relation Age of Onset   Lymphoma Mother    Cancer Father        ureter   Stomach cancer Father    Heart disease Father 34       Stents   High blood pressure Father    High Cholesterol Father    Thyroid cancer Sister     Prior to Admission medications   Medication Sig Start Date End Date Taking? Authorizing Provider  acetaminophen (TYLENOL) 500 MG tablet Take 1,000 mg by mouth every 6 (six) hours as needed for moderate pain.   Yes [provider]  cyanocobalamin (VITAMIN B12) 1000 MCG/ML injection Inject 1 mL (1,000 mcg total) into the muscle every 30 (thirty) days. 03/21/22  Yes McGowen, Adrian Blackwater, MD  dicyclomine (BENTYL) 20 MG tablet Take 1 tablet (20 mg total) by mouth every 12 (twelve) hours as needed (for abdominal pain). 05/12/22  Yes Antonietta Breach, PA-C  fenofibrate (TRICOR) 145 MG tablet Take 1 tablet (145 mg total) by mouth daily. 05/12/22  Yes [name], MD  icosapent Ethyl (VASCEPA) 1 g capsule Take 1 capsule (1 g total) by mouth 2 (two) times daily. 04/25/22  Yes [name], MD  Ipratropium-Albuterol (COMBIVENT) 20-100 MCG/ACT AERS respimat Inhale 1 puff into the lungs 4 (four) times daily. Patient taking differently: Inhale 1 puff into the lungs every 4 (four) hours as needed for wheezing or shortness of breath.  05/07/20 05/13/22 Yes Wonda Olds, MD  metoprolol succinate (TOPROL-XL) 50 MG 24 hr tablet Take 1 tablet (50 mg total) by mouth daily. Take with or immediately following a meal. 05/12/22  Yes Hochrein, Jeneen Rinks, MD  Multiple Vitamin (MULTIVITAMIN WITH MINERALS) TABS tablet Take 1 tablet by mouth daily. Men's One A Day   Yes [provider]  omeprazole (PRILOSEC) 20 MG capsule Take 1 capsule (20 mg total) by mouth daily. 03/17/21  Yes [name], MD  ondansetron (ZOFRAN-ODT) 4 MG disintegrating tablet Take 1 tablet (4 mg total) by mouth every 8 (eight) hours as needed for nausea or vomiting. 05/12/22  Yes Antonietta Breach, PA-C  oxyCODONE-acetaminophen (PERCOCET/ROXICET) 5-325 MG tablet Take 1-2 tablets by mouth every 6 (  six) hours as needed for severe pain. 05/12/22  Yes Antonietta Breach, PA-C  oxymetazoline (AFRIN) 0.05 % nasal spray Place 1 spray into both nostrils daily as needed for congestion.   Yes [provider]  rosuvastatin (CRESTOR) 20 MG tablet TAKE 1 TABLET BY MOUTH EVERY DAY 03/29/22  Yes [name], MD  valsartan (DIOVAN) 160 MG tablet Take 1 tablet (160 mg total) by mouth 2 (two) times daily. Patient taking differently: Take 160 mg by mouth daily. 04/25/22  Yes [name], MD  zolpidem (AMBIEN) 10 MG tablet Take 10 mg by mouth at bedtime as needed for sleep.   Yes [provider]    Physical Exam: Vitals:   05/13/22 1930 05/13/22 1945 05/13/22 1954 05/13/22 2123  BP: (!) 145/87 (!) 165/93    Pulse: 92 97    Resp: 20 (!) 26    Temp:   99.7 F (37.6 C) 98.7 F (37.1 C)  TempSrc:   Oral Oral  SpO2:  93%    Weight:      Height:       General:  Appears uncomfortable and in pain. Mildly diaphoretic.  Eyes:  PERRL, EOMI, normal lids, iris. +scleral icterus  ENT:  grossly normal hearing, lips & tongue, mmm; appropriate dentition Neck:  no LAD, masses or thyromegaly; no carotid bruits Cardiovascular:  RRR, no m/r/g. No LE edema.  Respiratory:   CTA  bilaterally with no wheezes/rales/rhonchi.  Normal respiratory effort. Abdomen:  soft, TTP in RUQ/epigastric and umbilical. +murphy sign. No rebound or guarding.  Back:   normal alignment, no CVAT Skin:  no rash or induration seen on limited exam. Jaundiced.  Musculoskeletal:  grossly normal tone BUE/BLE, good ROM, no bony abnormality Lower extremity:  No LE edema.  Limited foot exam with no ulcerations.  2+ distal pulses. Psychiatric:  grossly normal mood and affect, speech fluent and appropriate, AOx3 Neurologic:  CN 2-12 grossly intact, moves all extremities in coordinated fashion, sensation intact   Radiological Exams on Admission: Independently reviewed - see discussion in A/P where applicable  CT ABDOMEN PELVIS W CONTRAST  Result Date: 05/13/2022 CLINICAL DATA:  Epigastric abdominal pain. EXAM: CT ABDOMEN AND PELVIS WITH CONTRAST TECHNIQUE: Multidetector CT imaging of the abdomen and pelvis was performed using the standard protocol following bolus administration of intravenous contrast. RADIATION DOSE REDUCTION: This exam was performed according to the departmental dose-optimization program which includes automated exposure control, adjustment of the mA and/or kV according to patient size and/or use of iterative reconstruction technique. CONTRAST:  35m OMNIPAQUE IOHEXOL 350 MG/ML SOLN COMPARISON:  May 12, 2022. FINDINGS: Lower chest: No acute abnormality. Hepatobiliary: Cholelithiasis and stable gallbladder distention is noted. No biliary dilatation is noted. Liver is unremarkable. Pancreas: Unremarkable. No pancreatic ductal dilatation or surrounding inflammatory changes. Spleen: Normal in size without focal abnormality. Adrenals/Urinary Tract: Adrenal glands appear normal. Stable small bilateral renal cysts are noted for which no further follow-up is required. No hydronephrosis or renal obstruction is noted. Urinary bladder is unremarkable. Stomach/Bowel: Stomach is within normal limits.  Appendix appears normal. No evidence of bowel wall thickening, distention, or inflammatory changes. Stable postsurgical changes involving distal ileum and sigmoid colon. Vascular/Lymphatic: No significant vascular findings are present. Stable minimally enlarged mesenteric lymph nodes which most likely are reactive in etiology. Reproductive: Prostate is unremarkable. Other: Small fat containing periumbilical hernia. No ascites is noted. Musculoskeletal: No acute or significant osseous findings. IMPRESSION: Mild cholelithiasis and gallbladder is distention is noted which is unchanged compared to prior exam  performed yesterday. Biliary dilatation is noted. Gastric distention noted on prior exam of yesterday is resolved. Small bilateral renal cysts are noted which are unchanged compared to prior exam performed yesterday. No further follow-up is required Small fat containing periumbilical hernia is noted which is unchanged compared to prior exam performed yesterday. Electronically Signed   By: Marijo Conception M.D.   On: 05/13/2022 18:37   CT Angio Chest/Abd/Pel for Dissection W and/or Wo Contrast  Result Date: 05/12/2022 CLINICAL DATA:  Presents with epigastric pain and chest pain. History of 04/01/2020 ascending aortic aneurysm repair need to check for dissection or recurring aneurysm. EXAM: CT ANGIOGRAPHY CHEST, ABDOMEN AND PELVIS TECHNIQUE: Non-contrast CT of the chest was initially obtained. Multidetector CT imaging through the chest, abdomen and pelvis was performed using the standard protocol during bolus administration of intravenous contrast. Multiplanar reconstructed images and MIPs were obtained and reviewed to evaluate the vascular anatomy. RADIATION DOSE REDUCTION: This exam was performed according to the departmental dose-optimization program which includes automated exposure control, adjustment of the mA and/or kV according to patient size and/or use of iterative reconstruction technique. CONTRAST:   157m OMNIPAQUE IOHEXOL 350 MG/ML SOLN COMPARISON:  Preoperative CTA chest 02/19/2020. FINDINGS: CTA CHEST FINDINGS Cardiovascular: There are no visible coronary or aortic calcifications. There are intact median sternotomy sutures, well-healed median sternotomy, and postsurgical changes compatible with interval sleeve repair of the previously noted 4.7 cm ascending aortic aneurysm. There is mild descending aortic tortuosity. The great vessels are clear. There is no aortic dissection or stenosis. Post repair the aorta is within normal caliber limits except for a mild dilatation in the root at the sinuses of Valsalva where the diameter is 4.2 cm, previously 4.0 cm. There is mild panchamber cardiomegaly. No pericardial effusion is seen. The pulmonary veins are decompressed but there is prominence of the pulmonary trunk, which measures 5.6 cm consistent with arterial hypertension, previously measuring 3.1 cm. No arterial embolism is seen or findings of acute right heart strain. Mediastinum/Nodes: No enlarged mediastinal, hilar, or axillary lymph nodes. Thyroid gland, trachea, and esophagus demonstrate no significant findings. Mild lipomatosis is again noted in the superior mediastinum, increased. Asymmetric elevation of the right hemidiaphragm is unchanged. Lungs/Pleura: No pleural effusion, thickening or pneumothorax. There is mild posterior atelectasis. There is mild bronchial thickening diffusely without visible bronchial plugging. There are no pulmonary infiltrate or nodule. Musculoskeletal: No acute or significant osseous findings. No chest wall mass. Review of the MIP images confirms the above findings. CTA ABDOMEN AND PELVIS FINDINGS VASCULAR Aorta: Normal caliber aorta without aneurysm, dissection, vasculitis or significant stenosis. There are trace calcific plaques distally. Celiac: Normal. SMA: Normal. Renals: Both are single.  Both are normal. IMA: Normal. Inflow: Patent without evidence of aneurysm,  dissection, vasculitis or significant stenosis. There are minimal calcific plaques in both common iliac arteries. Veins: Unopacified and not evaluated. Review of the MIP images confirms the above findings. NON-VASCULAR Hepatobiliary: The liver is 18.5 cm in length with mild-to-moderate steatosis. A flash filling hemangioma, again measuring 1 cm, is redemonstrated in the posterior segment of the right lobe on 7:125. No other focal abnormality is seen. Gallbladder is dilated to 10.5 cm. There are multiple small stones posteriorly. Equivocal wall thickening on the coronal reformatting. Correlate clinically for cholecystitis. Ultrasound may be helpful. There is no biliary dilatation. Pancreas: No abnormality. Spleen: Mild splenomegaly, splenic AP diameter 14.7 cm, previously 13.9 cm. No mass. Adrenals/Urinary Tract: There is no adrenal mass. There is homogeneous thin walled 1.6 cm  cyst in the posterior aspect of the right kidney. Hounsfield density is 8.3. No follow-up imaging is recommended. There is a 1.1 cm homogeneous cyst in the posterior upper left kidney, Hounsfield density is 2. No follow-up imaging is recommended. The rest of both kidneys enhance homogeneously. There is no urinary stone or obstruction. The bladder unremarkable for the degree of distention. Stomach/Bowel: Stomach distended with fluid and food products. No wall thickening or duodenal dilatation. This could reflect changes due to a recent ingestion or due to impaired gastric emptying. There is no small bowel obstruction or inflammation. There is postsurgical change of ileocecal junction compatible with a distal ileectomy and side-to-side anastomosis. The appendix remains and is normal caliber. There is diverticulosis. There is hazy reaction along side the junction of the descending and sigmoid colon which could be due to a mild diverticulitis or fat scarring from old diverticular disease. More distally there is a rectosigmoid colocolic surgical  anastomosis which is well patent. Lymphatic: There are multiple borderline to minimally prominent mesenteric lymph nodes in the root fat and a few in the right lower quadrant go Ward haziness in the mesenteric root fat. Query mesenteric adenitis/panniculitis or fibrosing mesenteritis. No further adenopathy. Reproductive: Prostate is unremarkable. Other: Small umbilical fat hernia. No incarcerated hernias. No free air, free fluid or hemorrhage Musculoskeletal: No acute or significant osseous findings. Review of the MIP images confirms the above findings. IMPRESSION: 1. No findings of acute aortic syndrome. 2. Postsurgical changes of the ascending aorta with mild dilatation in the root at the sinuses of Valsalva where the diameter is 4.2 cm, previously 4.0 cm. The remainder is within normal caliber limits post repair. Recommend annual imaging followup by CTA or MRA. This recommendation follows 2010 ACCF/AHA/AATS/ACR/ASA/SCA/SCAI/SIR/STS/SVM Guidelines for the Diagnosis and Management of Patients with Thoracic Aortic Disease. Circulation. 2010; 121: K481-E563. Aortic aneurysm NOS (ICD10-I71.9) 3. Cardiomegaly without evidence of CHF. 4. Prominent pulmonary trunk consistent with arterial hypertension, increased in prominence from 2021. 5. Bronchitis.  No visible focal pneumonia. 6. Distended gallbladder with cholelithiasis and equivocal wall thickening. Correlate clinically for acute cholecystitis. Ultrasound may be helpful. 7. Distended stomach with fluid and food products. This could be due to a recent ingestion or impaired gastric emptying. 8. Diverticulosis with hazy reaction along side the junction of the descending and sigmoid colon, which could be due to a mild diverticulitis or fat scarring from old diverticular disease. No free air or diverticular abscess. 9. Mild hepatosplenomegaly with mild hepatic steatosis and stable 1 cm flash filling hemangioma in the right lobe. 10. Small umbilical fat hernia. 11.  Multiple borderline to minimally prominent mesenteric nodes in the root fat and a few in the right lower quadrant with "misty" mesentery. Query mesenteric adenitis/panniculitis or fibrosing mesenteritis. Electronically Signed   By: Telford Nab M.D.   On: 05/12/2022 04:18   US Abdomen Limited  Result Date: 05/12/2022 CLINICAL DATA:  Right upper quadrant abdominal pain. EXAM: ULTRASOUND ABDOMEN LIMITED RIGHT UPPER QUADRANT COMPARISON:  None Available. FINDINGS: Gallbladder: There multiple gallstones. No gallbladder wall thickening or pericholecystic fluid. Negative sonographic Murphy's sign. Common bile duct: Diameter: 5 mm Liver: There is diffuse increased liver echogenicity most commonly seen in the setting of fatty infiltration. Superimposed inflammation or fibrosis is not excluded. Clinical correlation is recommended. Portal vein is patent on color Doppler imaging with normal direction of blood flow towards the liver. Other: None. IMPRESSION: 1. Cholelithiasis without sonographic evidence of acute cholecystitis. 2. Fatty liver. Electronically Signed   By: Milas Hock  Radparvar M.D.   On: 05/12/2022 02:55    EKG: pending   Labs on Admission: I have personally reviewed the available labs and imaging studies at the time of the admission.  Pertinent labs:   hgb: 17.3,  creatinine: 1.33,  AST: 343,  ALT: 336,  t.bili: 8.7,  lipase: 661  Assessment and Plan: Principal Problem:   Cholelithiasis with concern for choledocholithiasis Active Problems:   Polycythemia   Thrombocytopenia (HCC)   Ascending aortic aneurysm s/p repair   Essential hypertension   CKD (chronic kidney disease) stage 3, GFR 30-59 ml/min (HCC)   GERD (gastroesophageal reflux disease)   Dyslipidemia   OSA (obstructive sleep apnea)   History of colon cancer    Assessment and Plan: * Cholelithiasis with concern for choledocholithiasis 52 year old presenting with acute onset of abdominal pain, jaundice, nausea found to have  cholelithiasis with high suspicion for choledocholithiasis  -admit to med-surg -general surgery consulted -CT shows no cholecystitis, but mild cholelithiasis and gallbladder distention. Biliary dilation noted. Concern for stone in bile duct with his jaundice and t.bili to 8.7. lipase at 661.  -MRCP pending -if +, will need GI consult for ERCP -continue NPO except for ice chips and meds -IVF with NS -pain medication with dilaudid as he is in significant pain  -fever to 100.3 on arrival and diaphoretic, start cipro and flagyl to cover and can de escalate if cholecystitis ruled out. Anaphylaxis allergy to PCN     Polycythemia States he has had this "forever" Never seen a hematologist for his polycythemia or thrombocytopenia.  ? Secondary to OSA vs. Pulmonary disease  Continue to trend, recommend f/u with heme/onc outpatient   Thrombocytopenia ([place]) States he has had this forever  Will check B12/smear and HIV Does have NAFLD and could be secondary to this  Does not drink alcohol  Recommend outpatient heme f/u     Ascending aortic aneurysm s/p repair Done at [facility]  CT today shows 4.0>4.2 Recommend yearly surveillance   Essential hypertension Stable  Continue home medication   CKD (chronic kidney disease) stage 3, GFR 30-59 ml/min (HCC) Stable, continue to monitor   GERD (gastroesophageal reflux disease) Continue PPI, change to IV   Dyslipidemia Hold statin with elevated liver enzymes   OSA (obstructive sleep apnea) Continue cpap     Advance Care Planning:   Code Status: Full Code   Consults: general surgery   DVT Prophylaxis: SCDs  Family Communication: wife at bedside    Severity of Illness: The appropriate patient status for this patient is INPATIENT. Inpatient status is judged to be reasonable and necessary in order to provide the required intensity of service to ensure the patient's safety. The patient's presenting symptoms, physical exam findings, and  initial radiographic and laboratory data in the context of their chronic comorbidities is felt to place them at high risk for further clinical deterioration. Furthermore, it is not anticipated that the patient will be medically stable for discharge from the hospital within 2 midnights of admission.   * I certify that at the point of admission it is my clinical judgment that the patient will require inpatient hospital care spanning beyond 2 midnights from the point of admission due to high intensity of service, high risk for further deterioration and high frequency of surveillance required.*  Author: Orma Flaming, MD 05/13/2022 10:19 PM  For on call review [email or web address].

## 2022-05-13 NOTE — Assessment & Plan Note (Signed)
Hold statin with elevated liver enzymes  °

## 2022-05-13 NOTE — ED Triage Notes (Signed)
Patient Clayton Hernandez from home for evaluation of abdominal pain, here yesterday for same and diagnosed with gallstones and referred to general surgery. Patient returns stating pain is worse than yesterday, patient is alert, oriented, and in no apparent distress at this time.

## 2022-05-13 NOTE — Assessment & Plan Note (Signed)
Continue cpap.  

## 2022-05-14 ENCOUNTER — Inpatient Hospital Stay (HOSPITAL_COMMUNITY): Payer: BC Managed Care – PPO

## 2022-05-14 DIAGNOSIS — I7121 Aneurysm of the ascending aorta, without rupture: Secondary | ICD-10-CM | POA: Diagnosis not present

## 2022-05-14 DIAGNOSIS — D696 Thrombocytopenia, unspecified: Secondary | ICD-10-CM

## 2022-05-14 DIAGNOSIS — D751 Secondary polycythemia: Secondary | ICD-10-CM | POA: Diagnosis not present

## 2022-05-14 DIAGNOSIS — N1831 Chronic kidney disease, stage 3a: Secondary | ICD-10-CM

## 2022-05-14 DIAGNOSIS — E785 Hyperlipidemia, unspecified: Secondary | ICD-10-CM

## 2022-05-14 DIAGNOSIS — K851 Biliary acute pancreatitis without necrosis or infection: Secondary | ICD-10-CM

## 2022-05-14 DIAGNOSIS — Z85038 Personal history of other malignant neoplasm of large intestine: Secondary | ICD-10-CM

## 2022-05-14 DIAGNOSIS — K8070 Calculus of gallbladder and bile duct without cholecystitis without obstruction: Secondary | ICD-10-CM | POA: Diagnosis not present

## 2022-05-14 DIAGNOSIS — G4733 Obstructive sleep apnea (adult) (pediatric): Secondary | ICD-10-CM

## 2022-05-14 DIAGNOSIS — I10 Essential (primary) hypertension: Secondary | ICD-10-CM

## 2022-05-14 LAB — COMPREHENSIVE METABOLIC PANEL
ALT: 196 U/L — ABNORMAL HIGH (ref 0–44)
AST: 241 U/L — ABNORMAL HIGH (ref 15–41)
Albumin: 3 g/dL — ABNORMAL LOW (ref 3.5–5.0)
Alkaline Phosphatase: 286 U/L — ABNORMAL HIGH (ref 38–126)
Anion gap: 11 (ref 5–15)
BUN: 12 mg/dL (ref 6–20)
CO2: 20 mmol/L — ABNORMAL LOW (ref 22–32)
Calcium: 8.3 mg/dL — ABNORMAL LOW (ref 8.9–10.3)
Chloride: 105 mmol/L (ref 98–111)
Creatinine, Ser: 1.44 mg/dL — ABNORMAL HIGH (ref 0.61–1.24)
GFR, Estimated: 58 mL/min — ABNORMAL LOW (ref >60)
Glucose, Bld: 112 mg/dL — ABNORMAL HIGH (ref 70–99)
Potassium: 3.9 mmol/L (ref 3.5–5.1)
Sodium: 136 mmol/L (ref 135–145)
Total Bilirubin: 8.1 mg/dL — ABNORMAL HIGH (ref 0.3–1.2)
Total Protein: 6 g/dL — ABNORMAL LOW (ref 6.5–8.1)

## 2022-05-14 LAB — CBC
HCT: 47 % (ref 39.0–52.0)
Hemoglobin: 15.6 g/dL (ref 13.0–17.0)
MCH: 28 pg (ref 26.0–34.0)
MCHC: 33.2 g/dL (ref 30.0–36.0)
MCV: 84.4 fL (ref 80.0–100.0)
Platelets: 123 10*3/uL — ABNORMAL LOW (ref 150–400)
RBC: 5.57 MIL/uL (ref 4.22–5.81)
RDW: 17.1 % — ABNORMAL HIGH (ref 11.5–15.5)
WBC: 6.1 10*3/uL (ref 4.0–10.5)
nRBC: 0 % (ref 0.0–0.2)

## 2022-05-14 LAB — LIPASE, BLOOD: Lipase: 380 U/L — ABNORMAL HIGH (ref 11–51)

## 2022-05-14 LAB — HIV ANTIBODY (ROUTINE TESTING W REFLEX): HIV Screen 4th Generation wRfx: NONREACTIVE

## 2022-05-14 MED ORDER — LORAZEPAM 2 MG/ML IJ SOLN
1.0000 mg | Freq: Once | INTRAMUSCULAR | Status: AC
  Administered 2022-05-14: 1 mg via INTRAVENOUS
  Filled 2022-05-14: qty 1

## 2022-05-14 MED ORDER — SODIUM CHLORIDE 0.9 % IV SOLN: INTRAVENOUS | Status: AC

## 2022-05-14 MED ORDER — GADOBUTROL 1 MMOL/ML IV SOLN
10.0000 mL | Freq: Once | INTRAVENOUS | Status: AC | PRN
  Administered 2022-05-14: 10 mL via INTRAVENOUS

## 2022-05-14 NOTE — ED Notes (Signed)
Patient transported to MRI. IV fluids paused and necklace removed.

## 2022-05-14 NOTE — Progress Notes (Signed)
PROGRESS NOTE    Clayton Hernandez  VFI:[id number] DOB: 28-Sep-1970 DOA: 05/13/2022 PCP: [name], MD   Brief Narrative:  The patient is a 52 year old overweight Caucasian male with a past medical history significant for but not limited to stage IIIa chronic kidney disease, NAFLD, hypertension, GERD, history of colon cancer, hyperlipidemia, OSA, ascending aortic aneurysm as well as other comorbidities who presented to the ED with complaint of abdominal pain.  Came to the ED yesterday and was diagnosed with gallstones and had outpatient follow-up with surgery but he worsened and had acute pain started around 5 PM and so he presented to the ED.  He had associated nausea, but no vomiting.  Pain was 10 out of 10 in severity with intensity coming on but he states that it is a constant pain.  The patient woke up yesterday morning and his urine was brown and he had 5 movements that were hard and discolored and he had noticed some blood in his urine and continued to have pain that caused him to double over so EMS was called.  He was admitted for further evaluation and a CT scan of the abdomen pelvis was done which showed mild cholelithiasis and gallbladder distention which was unchanged from the day before.  She did not biliary distention as well.  General surgery was consulted and given IV fluids and MRCP was done.  General surgery planning on cholecystectomy once his gallstone pancreatitis improves.  General surgery feels that this is likely be complicated surgery given the extent of his previous abdominal surgeries and recommending conservative management at this time with bowel rest and fluid hydration.  Assessment and Plan: * Cholelithiasis with concern for choledocholithiasis Acute Pancreatitis  -52 year old presenting with acute onset of abdominal pain, jaundice, nausea found to have cholelithiasis with high suspicion for choledocholithiasis  -Admit to med-surg -General surgery consulted -CT  shows no cholecystitis, but mild cholelithiasis and gallbladder distention. Biliary dilation noted. Concern for stone in bile duct with his jaundice and t.bili to 8.7. lipase at 661.  -MRCP done and showed "Motion degraded images. Cholelithiasis, with mild gallbladder distension and mild gallbladder wall thickening. No definite pericholecystic inflammatory changes to suggest early acute cholecystitis. No intrahepatic or extrahepatic ductal dilatation. Common duct measures 5 mm. No choledocholithiasis is seen. -if +, will need GI consult for ERCP -continue NPO except for ice chips and meds -IVF with NS -pain medication with dilaudid as he is in significant pain  -fever to 100.3 on arrival and diaphoretic, start cipro and flagyl to cover and can de escalate if cholecystitis ruled out. Anaphylaxis allergy to PCN   Erythrocytosis -States he has had this "forever" Never seen a hematologist for his polycythemia or thrombocytopenia.  ? Secondary to OSA vs. Pulmonary disease  -Hgb/Hct Trend: Recent Labs  Lab 04/24/22 0931 05/12/22 0040 05/12/22 0040 05/12/22 0122 05/13/22 1439 05/14/22 0218  HGB 18.0* 15.7  --  16.0 17.3* 15.6  HCT 56.5* 48.8   < > 47.0 52.1* 47.0  MCV 83 84.6   < >  --  83.8 84.4   < > = values in this interval not displayed.  -Recommend f/u with heme/onc outpatient   Ascending aortic aneurysm s/p repair -Done at [facility] today shows 4.0>4.2 -Recommend yearly surveillance   Essential hypertension -Stable  -C/w irbesartan 150 mg p.o. daily, metoprolol succinate 50 mL p.o. daily -Continue to monitor blood pressures per protocol -Last BP reading was 151/97  GERD (gastroesophageal reflux disease) -Continue  PPI with Pantoprazole but changed to IV   Dyslipidemia -Continue to Hold statin with elevated liver enzymes   OSA (obstructive sleep apnea) -Continue CPAP  CKD (chronic kidney disease) stage 3, GFR 30-59 ml/min (HCC) Metabolic Acidosis  -BUN/Cr  Trend: Recent Labs  Lab 04/24/22 0931 05/12/22 0040 05/12/22 0122 05/13/22 1439 05/14/22 0218  BUN 23 19 24* 14 12  CREATININE 1.42* 1.50* 1.40* 1.33* 1.44*  -Avoid Nephrotoxic Medications, Contrast Dyes, Hypotension and Dehydration to Ensure Adequate Renal Perfusion and will need to Renally Adjust Meds -Patient has a Metabolic Acidosis with a CO2 with 20, AG of 11, and Chloride Level of 105 -Continue to Monitor and Trend Renal Function carefully and repeat CMP in the AM   Thrombocytopenia -Platelet Count Trend: Recent Labs  Lab 04/24/22 0931 05/12/22 0040 05/13/22 1439 05/14/22 0218  PLT 132* 132* 142* 123*  -States that this is Chronic -Will check B12/smear and HIV -Does have NAFLD and could be secondary to this  -Does not drink alcohol  -Recommend outpatient heme f/u   Hyperbilirbuinemia Abnormal LFTs -LFT and Bilirubin Trend: Recent Labs  Lab 04/24/22 0931 04/24/22 0931 05/12/22 0040 05/13/22 1439 05/14/22 0218  AST 33  --  223* 343* 241*  ALT 24  --  104* 246* 196*  BILITOT 0.7   < > 2.4* 8.7* 8.1*   < > = values in this interval not displayed.  -Continue to monitor and trend hepatic function panel carefully and he had an MRCP as above -Repeat CMP in the a.m.  Hypoalbuminemia -Patient's Albumin Level Trend Recent Labs  Lab 04/24/22 0931 05/12/22 0040 05/13/22 1439 05/14/22 0218  ALBUMIN 4.5 3.5 3.6 3.0*  -Continue to Monitor and Trend and repeat CMP in the AM  Overweight -Complicates overall prognosis and care -Estimated body mass index is 28.87 kg/m as calculated from the following:   Height as of this encounter: '6\' 2"'$  (1.88 m).   Weight as of this encounter: 102 kg.  -Weight Loss and Dietary Counseling given  DVT prophylaxis: SCDs Start: 05/13/22 2108    Code Status: Full Code Family Communication: Discussed with wife at bedside  Disposition Plan:  Level of care: Med-Surg Status is: Inpatient Remains inpatient appropriate because: Needs  further clinical improvement and likely will get a cholecystectomy this admission   Consultants:  General surgery  Procedures:  As delineated as above  Antimicrobials:  Anti-infectives (From admission, onward)    Start     Dose/Rate Route Frequency Ordered Stop   05/13/22 2230  metroNIDAZOLE (FLAGYL) IVPB 500 mg        500 mg 100 mL/hr over 60 Minutes Intravenous Every 12 hours 05/13/22 2218     05/13/22 2230  ciprofloxacin (CIPRO) IVPB 400 mg        400 mg 200 mL/hr over 60 Minutes Intravenous Every 12 hours 05/13/22 2229         Subjective: Seen and examined at bedside and he states that he continues to have some abdominal discomfort and pain.  States that his eyes have been yellow.  Feels nauseous somewhat.  No chest pain or discomfort.  No other concerns or complaints at this time.  Objective: Vitals:   05/14/22 0000 05/14/22 0058 05/14/22 0230 05/14/22 0505  BP: (!) 148/98  (!) 146/109 (!) 164/98  Pulse: 72  79 73  Resp: 20  (!) 22 18  Temp:  99 F (37.2 C)  98.3 F (36.8 C)  TempSrc:  Oral  Oral  SpO2: 92%  96%  93%  Weight:      Height:       No intake or output data in the 24 hours ending 05/14/22 0846 Filed Weights   05/13/22 1422  Weight: 102 kg   Examination: Physical Exam:  Constitutional: WN/WD overweight Caucasian male who appears uncomfortable Eyes: Sclera are icteric Respiratory: Diminished to auscultation bilaterally with coarse breath sounds, no wheezing, rales, rhonchi or crackles. Normal respiratory effort and patient is not tachypenic. No accessory muscle use.  Unlabored breathing Cardiovascular: RRR, no murmurs / rubs / gallops. S1 and S2 auscultated.  Mild extremity edema.  Abdomen: Soft, tender to palpate and is distended secondary to body habitus.  Bowel sounds positive.  GU: Deferred. Musculoskeletal: No clubbing / cyanosis of digits/nails. No joint deformity in the upper and lower extremities.  Skin: No rashes, lesions, ulcers on limited  skin evaluation. No induration; Warm and dry.  Neurologic: CN 2-12 grossly intact with no focal deficits. Romberg sign cerebellar reflexes not assessed.  Psychiatric: Normal judgment and insight. Alert and oriented x 3.  Slightly anxious mood  Data Reviewed: I have personally reviewed following labs and imaging studies  CBC: Recent Labs  Lab 05/12/22 0040 05/12/22 0122 05/13/22 1439 05/14/22 0218  WBC 8.9  --  7.9 6.1  NEUTROABS 7.2  --   --   --   HGB 15.7 16.0 17.3* 15.6  HCT 48.8 47.0 52.1* 47.0  MCV 84.6  --  83.8 84.4  PLT 132*  --  142* 267*   Basic Metabolic Panel: Recent Labs  Lab 05/12/22 0040 05/12/22 0122 05/13/22 1439 05/14/22 0218  NA 138 141 136 136  K 3.9 4.0 3.8 3.9  CL 106 107 104 105  CO2 22  --  22 20*  GLUCOSE 186* 156* 106* 112*  BUN 19 24* 14 12  CREATININE 1.50* 1.40* 1.33* 1.44*  CALCIUM 8.6*  --  8.7* 8.3*   GFR: Estimated Creatinine Clearance: 75.6 mL/min (A) (by C-G formula based on SCr of 1.44 mg/dL (H)). Liver Function Tests: Recent Labs  Lab 05/12/22 0040 05/13/22 1439 05/14/22 0218  AST 223* 343* 241*  ALT 104* 246* 196*  ALKPHOS 186* 336* 286*  BILITOT 2.4* 8.7* 8.1*  PROT 6.1* 6.4* 6.0*  ALBUMIN 3.5 3.6 3.0*   Recent Labs  Lab 05/12/22 0040 05/13/22 1439 05/14/22 0218  LIPASE 46 661* 380*   No results for input(s): "AMMONIA" in the last 168 hours. Coagulation Profile: Recent Labs  Lab 05/13/22 2102  INR 1.3*   Cardiac Enzymes: No results for input(s): "CKTOTAL", "CKMB", "CKMBINDEX", "TROPONINI" in the last 168 hours. BNP (last 3 results) No results for input(s): "PROBNP" in the last 8760 hours. HbA1C: No results for input(s): "HGBA1C" in the last 72 hours. CBG: No results for input(s): "GLUCAP" in the last 168 hours. Lipid Profile: No results for input(s): "CHOL", "HDL", "LDLCALC", "TRIG", "CHOLHDL", "LDLDIRECT" in the last 72 hours. Thyroid Function Tests: No results for input(s): "TSH", "T4TOTAL", "FREET4",  "T3FREE", "THYROIDAB" in the last 72 hours. Anemia Panel: No results for input(s): "VITAMINB12", "FOLATE", "FERRITIN", "TIBC", "IRON", "RETICCTPCT" in the last 72 hours. Sepsis Labs: No results for input(s): "PROCALCITON", "LATICACIDVEN" in the last 168 hours.  No results found for this or any previous visit (from the past 240 hour(s)).   Radiology Studies: MR ABDOMEN MRCP W WO CONTAST  Result Date: 05/14/2022 CLINICAL DATA:  Cholelithiasis, evaluate for choledocholithiasis EXAM: MRI ABDOMEN WITHOUT AND WITH CONTRAST (INCLUDING MRCP) TECHNIQUE: Multiplanar multisequence MR imaging of the abdomen  was performed both before and after the administration of intravenous contrast. Heavily T2-weighted images of the biliary and pancreatic ducts were obtained, and three-dimensional MRCP images were rendered by post processing. CONTRAST:  78m GADAVIST GADOBUTROL 1 MMOL/ML IV SOLN COMPARISON:  CT abdomen/pelvis dated 05/13/2022 FINDINGS: Motion degraded images. Lower chest: Lung bases are clear. Hepatobiliary: Liver is within normal limits. Gallbladder is notable for layering gallstones (series 3/image 29), mild gallbladder distension, and mild gallbladder wall thickening. No definite pericholecystic inflammatory changes. Mild periportal edema. No intrahepatic or extrahepatic ductal dilatation. Common duct measures 5 mm. No choledocholithiasis is seen. Pancreas:  Within normal limits. Spleen:  Within normal limits. Adrenals/Urinary Tract:  Adrenal glands are within normal limits. Left renal sinus cysts. Right upper pole renal cysts measuring up to 18 mm (series 3/image 34), simple, benign (Bosniak I). No follow-up is recommended. No hydronephrosis. Stomach/Bowel: Stomach is within normal limits. Visualized bowel is unremarkable. Vascular/Lymphatic:  No evidence of abdominal aortic aneurysm. No suspicious abdominal lymphadenopathy. Other:  No abdominal ascites. Musculoskeletal: No focal osseous lesions. IMPRESSION:  Motion degraded images. Cholelithiasis, with mild gallbladder distension and mild gallbladder wall thickening. No definite pericholecystic inflammatory changes to suggest early acute cholecystitis. No intrahepatic or extrahepatic ductal dilatation. Common duct measures 5 mm. No choledocholithiasis is seen. Electronically Signed   By: SJulian HyM.D.   On: 05/14/2022 02:34   CT ABDOMEN PELVIS W CONTRAST  Result Date: 05/13/2022 CLINICAL DATA:  Epigastric abdominal pain. EXAM: CT ABDOMEN AND PELVIS WITH CONTRAST TECHNIQUE: Multidetector CT imaging of the abdomen and pelvis was performed using the standard protocol following bolus administration of intravenous contrast. RADIATION DOSE REDUCTION: This exam was performed according to the departmental dose-optimization program which includes automated exposure control, adjustment of the mA and/or kV according to patient size and/or use of iterative reconstruction technique. CONTRAST:  794mOMNIPAQUE IOHEXOL 350 MG/ML SOLN COMPARISON:  May 12, 2022. FINDINGS: Lower chest: No acute abnormality. Hepatobiliary: Cholelithiasis and stable gallbladder distention is noted. No biliary dilatation is noted. Liver is unremarkable. Pancreas: Unremarkable. No pancreatic ductal dilatation or surrounding inflammatory changes. Spleen: Normal in size without focal abnormality. Adrenals/Urinary Tract: Adrenal glands appear normal. Stable small bilateral renal cysts are noted for which no further follow-up is required. No hydronephrosis or renal obstruction is noted. Urinary bladder is unremarkable. Stomach/Bowel: Stomach is within normal limits. Appendix appears normal. No evidence of bowel wall thickening, distention, or inflammatory changes. Stable postsurgical changes involving distal ileum and sigmoid colon. Vascular/Lymphatic: No significant vascular findings are present. Stable minimally enlarged mesenteric lymph nodes which most likely are reactive in etiology.  Reproductive: Prostate is unremarkable. Other: Small fat containing periumbilical hernia. No ascites is noted. Musculoskeletal: No acute or significant osseous findings. IMPRESSION: Mild cholelithiasis and gallbladder is distention is noted which is unchanged compared to prior exam performed yesterday. Biliary dilatation is noted. Gastric distention noted on prior exam of yesterday is resolved. Small bilateral renal cysts are noted which are unchanged compared to prior exam performed yesterday. No further follow-up is required Small fat containing periumbilical hernia is noted which is unchanged compared to prior exam performed yesterday. Electronically Signed   By: JaMarijo Conception.D.   On: 05/13/2022 18:37    Scheduled Meds:  irbesartan  150 mg Oral Daily   metoprolol succinate  50 mg Oral Daily   pantoprazole (PROTONIX) IV  40 mg Intravenous Q24H   Continuous Infusions:  ciprofloxacin Stopped (05/14/22 0003)   metronidazole Stopped (05/14/22 0003)    LOS: 1  day   Raiford Noble, DO [facility] Available via Epic secure chat 7am-7pm After these hours, please refer to coverage provider listed on [email or web address] 05/14/2022, 8:46 AM

## 2022-05-14 NOTE — Progress Notes (Signed)
Subjective/Chief Complaint: Complains of abd pain   Objective: Vital signs in last 24 hours: Temp:  [98.3 F (36.8 C)-100.3 F (37.9 C)] 98.3 F (36.8 C) (01/28 0505) Pulse Rate:  [72-97] 73 (01/28 0505) Resp:  [18-29] 18 (01/28 0505) BP: (145-165)/(87-109) 164/98 (01/28 0505) SpO2:  [92 %-98 %] 93 % (01/28 0505) Weight:  [102 kg] 102 kg (01/27 1422)    Intake/Output from previous day: No intake/output data recorded. Intake/Output this shift: No intake/output data recorded.  General appearance: alert and cooperative Resp: clear to auscultation bilaterally Cardio: regular rate and rhythm GI: moderate diffuse tenderness. Multiple well healed scars on abd  Lab Results:  Recent Labs    05/13/22 1439 05/14/22 0218  WBC 7.9 6.1  HGB 17.3* 15.6  HCT 52.1* 47.0  PLT 142* 123*   BMET Recent Labs    05/13/22 1439 05/14/22 0218  NA 136 136  K 3.8 3.9  CL 104 105  CO2 22 20*  GLUCOSE 106* 112*  BUN 14 12  CREATININE 1.33* 1.44*  CALCIUM 8.7* 8.3*   PT/INR Recent Labs    05/13/22 2102  LABPROT 15.6*  INR 1.3*   ABG No results for input(s): "PHART", "HCO3" in the last 72 hours.  Invalid input(s): "PCO2", "PO2"  Studies/Results: MR ABDOMEN MRCP W WO CONTAST  Result Date: 05/14/2022 CLINICAL DATA:  Cholelithiasis, evaluate for choledocholithiasis EXAM: MRI ABDOMEN WITHOUT AND WITH CONTRAST (INCLUDING MRCP) TECHNIQUE: Multiplanar multisequence MR imaging of the abdomen was performed both before and after the administration of intravenous contrast. Heavily T2-weighted images of the biliary and pancreatic ducts were obtained, and three-dimensional MRCP images were rendered by post processing. CONTRAST:  32m GADAVIST GADOBUTROL 1 MMOL/ML IV SOLN COMPARISON:  CT abdomen/pelvis dated 05/13/2022 FINDINGS: Motion degraded images. Lower chest: Lung bases are clear. Hepatobiliary: Liver is within normal limits. Gallbladder is notable for layering gallstones (series  3/image 29), mild gallbladder distension, and mild gallbladder wall thickening. No definite pericholecystic inflammatory changes. Mild periportal edema. No intrahepatic or extrahepatic ductal dilatation. Common duct measures 5 mm. No choledocholithiasis is seen. Pancreas:  Within normal limits. Spleen:  Within normal limits. Adrenals/Urinary Tract:  Adrenal glands are within normal limits. Left renal sinus cysts. Right upper pole renal cysts measuring up to 18 mm (series 3/image 34), simple, benign (Bosniak I). No follow-up is recommended. No hydronephrosis. Stomach/Bowel: Stomach is within normal limits. Visualized bowel is unremarkable. Vascular/Lymphatic:  No evidence of abdominal aortic aneurysm. No suspicious abdominal lymphadenopathy. Other:  No abdominal ascites. Musculoskeletal: No focal osseous lesions. IMPRESSION: Motion degraded images. Cholelithiasis, with mild gallbladder distension and mild gallbladder wall thickening. No definite pericholecystic inflammatory changes to suggest early acute cholecystitis. No intrahepatic or extrahepatic ductal dilatation. Common duct measures 5 mm. No choledocholithiasis is seen. Electronically Signed   By: SJulian HyM.D.   On: 05/14/2022 02:34   CT ABDOMEN PELVIS W CONTRAST  Result Date: 05/13/2022 CLINICAL DATA:  Epigastric abdominal pain. EXAM: CT ABDOMEN AND PELVIS WITH CONTRAST TECHNIQUE: Multidetector CT imaging of the abdomen and pelvis was performed using the standard protocol following bolus administration of intravenous contrast. RADIATION DOSE REDUCTION: This exam was performed according to the departmental dose-optimization program which includes automated exposure control, adjustment of the mA and/or kV according to patient size and/or use of iterative reconstruction technique. CONTRAST:  734mOMNIPAQUE IOHEXOL 350 MG/ML SOLN COMPARISON:  May 12, 2022. FINDINGS: Lower chest: No acute abnormality. Hepatobiliary: Cholelithiasis and stable  gallbladder distention is noted. No biliary dilatation is noted.  Liver is unremarkable. Pancreas: Unremarkable. No pancreatic ductal dilatation or surrounding inflammatory changes. Spleen: Normal in size without focal abnormality. Adrenals/Urinary Tract: Adrenal glands appear normal. Stable small bilateral renal cysts are noted for which no further follow-up is required. No hydronephrosis or renal obstruction is noted. Urinary bladder is unremarkable. Stomach/Bowel: Stomach is within normal limits. Appendix appears normal. No evidence of bowel wall thickening, distention, or inflammatory changes. Stable postsurgical changes involving distal ileum and sigmoid colon. Vascular/Lymphatic: No significant vascular findings are present. Stable minimally enlarged mesenteric lymph nodes which most likely are reactive in etiology. Reproductive: Prostate is unremarkable. Other: Small fat containing periumbilical hernia. No ascites is noted. Musculoskeletal: No acute or significant osseous findings. IMPRESSION: Mild cholelithiasis and gallbladder is distention is noted which is unchanged compared to prior exam performed yesterday. Biliary dilatation is noted. Gastric distention noted on prior exam of yesterday is resolved. Small bilateral renal cysts are noted which are unchanged compared to prior exam performed yesterday. No further follow-up is required Small fat containing periumbilical hernia is noted which is unchanged compared to prior exam performed yesterday. Electronically Signed   By: Marijo Conception M.D.   On: 05/13/2022 18:37    Anti-infectives: Anti-infectives (From admission, onward)    Start     Dose/Rate Route Frequency Ordered Stop   05/13/22 2230  metroNIDAZOLE (FLAGYL) IVPB 500 mg        500 mg 100 mL/hr over 60 Minutes Intravenous Every 12 hours 05/13/22 2218     05/13/22 2230  ciprofloxacin (CIPRO) IVPB 400 mg        400 mg 200 mL/hr over 60 Minutes Intravenous Every 12 hours 05/13/22 2229          Assessment/Plan: s/p * No surgery found * Continue bowel rest Gallstone pancreatitis. MRCP neg for cbd stone. Follow lft's Will benefit from having gb removed during this hospitalization so this doesn't happen again. Likely to be complicated surgery given the extent of his previous abd surgeries Will follow  LOS: 1 day    [name] 05/14/2022

## 2022-05-15 ENCOUNTER — Encounter (HOSPITAL_COMMUNITY): Payer: Self-pay | Admitting: Family Medicine

## 2022-05-15 DIAGNOSIS — I7121 Aneurysm of the ascending aorta, without rupture: Secondary | ICD-10-CM | POA: Diagnosis not present

## 2022-05-15 DIAGNOSIS — K8070 Calculus of gallbladder and bile duct without cholecystitis without obstruction: Secondary | ICD-10-CM | POA: Diagnosis not present

## 2022-05-15 DIAGNOSIS — E785 Hyperlipidemia, unspecified: Secondary | ICD-10-CM | POA: Diagnosis not present

## 2022-05-15 DIAGNOSIS — D696 Thrombocytopenia, unspecified: Secondary | ICD-10-CM | POA: Diagnosis not present

## 2022-05-15 LAB — CBC
HCT: 46.2 % (ref 39.0–52.0)
Hemoglobin: 14.9 g/dL (ref 13.0–17.0)
MCH: 27.2 pg (ref 26.0–34.0)
MCHC: 32.3 g/dL (ref 30.0–36.0)
MCV: 84.3 fL (ref 80.0–100.0)
Platelets: 130 10*3/uL — ABNORMAL LOW (ref 150–400)
RBC: 5.48 MIL/uL (ref 4.22–5.81)
RDW: 16.9 % — ABNORMAL HIGH (ref 11.5–15.5)
WBC: 6.4 10*3/uL (ref 4.0–10.5)
nRBC: 0 % (ref 0.0–0.2)

## 2022-05-15 LAB — COMPREHENSIVE METABOLIC PANEL
ALT: 127 U/L — ABNORMAL HIGH (ref 0–44)
AST: 99 U/L — ABNORMAL HIGH (ref 15–41)
Albumin: 2.9 g/dL — ABNORMAL LOW (ref 3.5–5.0)
Alkaline Phosphatase: 304 U/L — ABNORMAL HIGH (ref 38–126)
Anion gap: 17 — ABNORMAL HIGH (ref 5–15)
BUN: 13 mg/dL (ref 6–20)
CO2: 19 mmol/L — ABNORMAL LOW (ref 22–32)
Calcium: 9 mg/dL (ref 8.9–10.3)
Chloride: 99 mmol/L (ref 98–111)
Creatinine, Ser: 1.31 mg/dL — ABNORMAL HIGH (ref 0.61–1.24)
GFR, Estimated: >60 mL/min (ref >60)
Glucose, Bld: 90 mg/dL (ref 70–99)
Potassium: 3.9 mmol/L (ref 3.5–5.1)
Sodium: 135 mmol/L (ref 135–145)
Total Bilirubin: 4.2 mg/dL — ABNORMAL HIGH (ref 0.3–1.2)
Total Protein: 5.8 g/dL — ABNORMAL LOW (ref 6.5–8.1)

## 2022-05-15 LAB — LIPASE, BLOOD: Lipase: 71 U/L — ABNORMAL HIGH (ref 11–51)

## 2022-05-15 MED ORDER — HYDRALAZINE HCL 20 MG/ML IJ SOLN
10.0000 mg | Freq: Four times a day (QID) | INTRAMUSCULAR | Status: DC | PRN
  Administered 2022-05-15: 10 mg via INTRAVENOUS
  Filled 2022-05-15: qty 1

## 2022-05-15 NOTE — ED Notes (Signed)
ED TO INPATIENT HANDOFF REPORT  ED Nurse Name and Phone #: Hinda Lenis Name/Age/Gender Clayton Hernandez 52 y.o. male Room/Bed: 041C/041C  Code Status   Code Status: Full Code  Home/SNF/Other Home Patient oriented to: self, place, time, and situation Is this baseline? Yes   Triage Complete: Triage complete  Chief Complaint Cholelithiasis [K80.20]  Triage Note Patient BIB [facility] from home for evaluation of abdominal pain, here yesterday for same and diagnosed with gallstones and referred to general surgery. Patient returns stating pain is worse than yesterday, patient is alert, oriented, and in no apparent distress at this time.   Allergies Allergies  Allergen Reactions   Morphine And Related Other (See Comments)    "all opiates" - "they'll kill me"   Penicillins Anaphylaxis    "All cillins"   Toradol [Ketorolac Tromethamine] Other (See Comments)    Unknown reaction (possible rash)    Level of Care/Admitting Diagnosis ED Disposition     ED Disposition  Admit   Condition  --   [place]: [facility] [[id number]]  Level of Care: Med-Surg [16]  May admit patient to [name] or [facility] if equivalent level of care is available:: No  Covid Evaluation: Asymptomatic - no recent exposure (last 10 days) testing not required  Diagnosis: Cholelithiasis [[id number]]  Admitting Physician: Orma Flaming [[id number]]  Attending Physician: Orma Flaming [[id number]]  Certification:: I certify this patient will need inpatient services for at least 2 midnights  Estimated Length of Stay: 3          B Medical/Surgery History Past Medical History:  Diagnosis Date   Ascending aortic aneurysm ([place])    4.7 cm. 12/2018 imaging->stable. 02/2020 CT angio/aortoa slight progression->cardiol ref'd him to CT surg   Cancer [facility])    colon cancers per doctor's notes   Chronic renal insufficiency, stage 3 (moderate) (HCC)    GFR 55 ml/min   Cirrhosis,  nonalcoholic (HCC)    NAFLD   Diverticulitis    Essential hypertension    Frequent headaches    GERD (gastroesophageal reflux disease)    History of colon cancer 2015   Surgery; but no chemo or rad was required.  Had a total of 5 surgeries, hx of colostomy, then an ileostomy.   Hx of migraines    Hyperlipemia, mixed    Insomnia    Lateral epicondylitis    Recurrent, right: steroid injection by Dr. Junius Roads Sept and Nov 2020.   Mild intermittent asthma    Obesity, Class I, BMI 30-34.9    Sleep apnea    Past Surgical History:  Procedure Laterality Date   ABI's  03/2019   Normal   CARDIAC CATHETERIZATION  03/30/2020   No CAD.   Cardiac rhythm monitoring  07/20/2020   ZIO patch x 3d-->no arrhythmias   COLON SURGERY  2015   2 cm colon ca excised.   COLONOSCOPY  many   he has had colonoscopy q46mosince 2015.  Most recent was approx 04/2018->normal.   COLOSTOMY  2015   diverticulitis    ILEOSTOMY  2016   RIGHT/LEFT HEART CATH AND CORONARY ANGIOGRAPHY N/A 03/30/2020   Procedure: RIGHT/LEFT HEART CATH AND CORONARY ANGIOGRAPHY;  Surgeon: VJettie Booze MD;  [facility];  Service: Cardiovascular;  Laterality: N/A;   TEE WITHOUT CARDIOVERSION N/A 04/01/2020   Procedure: TRANSESOPHAGEAL ECHOCARDIOGRAM (TEE);  Surgeon: AWonda Olds MD;  Location: [facility]  Service: Open Heart Surgery;  Laterality: N/A;  THORACIC AORTIC ANEURYSM REPAIR N/A 04/01/2020   Procedure: THORACIC ASCENDING ANEURYSM REPAIR (AAA) USING HEMASHIELD PLATINUM 30 MM VASCULAR SHIELD.;  Surgeon: Wonda Olds, MD;  Location: [place];  Service: Open Heart Surgery;  Laterality: N/A;   THORACIC AORTOGRAM N/A 03/30/2020   Procedure: THORACIC AORTOGRAM;  Surgeon: Jettie Booze, MD;  Location: [facility];  Service: Cardiovascular;  Laterality: N/A;   TRANSTHORACIC ECHOCARDIOGRAM  03/23/2020   49 mm ascend aort aneur.  EF 55-60%, grd II DD, valves normal.     A IV  Location/Drains/Wounds Patient Lines/Drains/Airways Status     Active Line/Drains/Airways     Name Placement date Placement time Site Days   Peripheral IV 05/13/22 20 G Left Antecubital 05/13/22  --  Antecubital  2   Peripheral IV 05/13/22 20 G Right Antecubital 05/13/22  2250  Antecubital  2            Intake/Output Last 24 hours  Intake/Output Summary (Last 24 hours) at 05/15/2022 1118 Last data filed at 05/14/2022 1317 Gross per 24 hour  Intake 300 ml  Output --  Net 300 ml    Labs/Imaging Results for orders placed or performed during the hospital encounter of 05/13/22 (from the past 48 hour(s))  Lipase, blood     Status: Abnormal   Collection Time: 05/13/22  2:39 PM  Result Value Ref Range   Lipase 661 (H) 11 - 51 U/L    Comment: RESULT CONFIRMED BY MANUAL DILUTION Performed at [facility], [place] [address] [place], [place] [address]   Comprehensive metabolic panel     Status: Abnormal   Collection Time: 05/13/22  2:39 PM  Result Value Ref Range   Sodium 136 135 - 145 mmol/L   Potassium 3.8 3.5 - 5.1 mmol/L   Chloride 104 98 - 111 mmol/L   CO2 22 22 - 32 mmol/L   Glucose, Bld 106 (H) 70 - 99 mg/dL    Comment: Glucose reference range applies only to samples taken after fasting for at least 8 hours.   BUN 14 6 - 20 mg/dL   Creatinine, Ser 1.33 (H) 0.61 - 1.24 mg/dL   Calcium 8.7 (L) 8.9 - 10.3 mg/dL   Total Protein 6.4 (L) 6.5 - 8.1 g/dL   Albumin 3.6 3.5 - 5.0 g/dL   AST 343 (H) 15 - 41 U/L   ALT 246 (H) 0 - 44 U/L   Alkaline Phosphatase 336 (H) 38 - 126 U/L   Total Bilirubin 8.7 (H) 0.3 - 1.2 mg/dL    Comment: DELTA CHECK NOTED   GFR, Estimated >60 >60 mL/min    Comment: (NOTE) Calculated using the CKD-EPI Creatinine Equation (2021)    Anion gap 10 5 - 15    Comment: Performed at [facility], [place] [address], [place], [place] [address]  CBC     Status: Abnormal   Collection Time: 05/13/22  2:39 PM  Result Value Ref Range   WBC 7.9 4.0 - 10.5  K/uL   RBC 6.22 (H) 4.22 - 5.81 MIL/uL   Hemoglobin 17.3 (H) 13.0 - 17.0 g/dL   HCT 52.1 (H) 39.0 - 52.0 %   MCV 83.8 80.0 - 100.0 fL   MCH 27.8 26.0 - 34.0 pg   MCHC 33.2 30.0 - 36.0 g/dL   RDW 17.6 (H) 11.5 - 15.5 %   Platelets 142 (L) 150 - 400 K/uL   nRBC 0.0 0.0 - 0.2 %    Comment: Performed  at [facility], [place] [address], [place], [place] [address]  Urinalysis, Routine w reflex microscopic -Urine, Clean Catch     Status: Abnormal   Collection Time: 05/13/22  2:49 PM  Result Value Ref Range   Color, Urine AMBER (A) YELLOW    Comment: BIOCHEMICALS MAY BE AFFECTED BY COLOR   APPearance CLEAR CLEAR   Specific Gravity, Urine 1.023 1.005 - 1.030   pH 5.0 5.0 - 8.0   Glucose, UA NEGATIVE NEGATIVE mg/dL   Hgb urine dipstick NEGATIVE NEGATIVE   Bilirubin Urine MODERATE (A) NEGATIVE   Ketones, ur NEGATIVE NEGATIVE mg/dL   Protein, ur NEGATIVE NEGATIVE mg/dL   Nitrite NEGATIVE NEGATIVE   Leukocytes,Ua NEGATIVE NEGATIVE    Comment: Performed at [facility], [place] [address], [place], [place] [address]  Type and screen [place]     Status: None   Collection Time: 05/13/22  9:02 PM  Result Value Ref Range   ABO/RH(D) O POS    Antibody Screen NEG    Sample Expiration      05/16/2022,2359 Performed at [facility], [place] [address], [place], [place] [address]   Protime-INR     Status: Abnormal   Collection Time: 05/13/22  9:02 PM  Result Value Ref Range   Prothrombin Time 15.6 (H) 11.4 - 15.2 seconds   INR 1.3 (H) 0.8 - 1.2    Comment: (NOTE) INR goal varies based on device and disease states. Performed at [facility], [place] [address] [place], [place] [address]   HIV Antibody (routine testing w rflx)     Status: None   Collection Time: 05/13/22 10:58 PM  Result Value Ref Range   HIV Screen 4th Generation wRfx Non Reactive Non Reactive    Comment: Performed at [facility], [place] [address], [place], [place] [address]   Comprehensive metabolic panel     Status: Abnormal   Collection Time: 05/14/22  2:18 AM  Result Value Ref Range   Sodium 136 135 - 145 mmol/L   Potassium 3.9 3.5 - 5.1 mmol/L   Chloride 105 98 - 111 mmol/L   CO2 20 (L) 22 - 32 mmol/L   Glucose, Bld 112 (H) 70 - 99 mg/dL    Comment: Glucose reference range applies only to samples taken after fasting for at least 8 hours.   BUN 12 6 - 20 mg/dL   Creatinine, Ser 1.44 (H) 0.61 - 1.24 mg/dL   Calcium 8.3 (L) 8.9 - 10.3 mg/dL   Total Protein 6.0 (L) 6.5 - 8.1 g/dL   Albumin 3.0 (L) 3.5 - 5.0 g/dL   AST 241 (H) 15 - 41 U/L   ALT 196 (H) 0 - 44 U/L   Alkaline Phosphatase 286 (H) 38 - 126 U/L   Total Bilirubin 8.1 (H) 0.3 - 1.2 mg/dL   GFR, Estimated 58 (L) >60 mL/min    Comment: (NOTE) Calculated using the CKD-EPI Creatinine Equation (2021)    Anion gap 11 5 - 15    Comment: Performed at [place] [address], [place] [address]  CBC     Status: Abnormal   Collection Time: 05/14/22  2:18 AM  Result Value Ref Range   WBC 6.1 4.0 - 10.5 K/uL   RBC 5.57 4.22 - 5.81 MIL/uL   Hemoglobin 15.6 13.0 - 17.0 g/dL   HCT 47.0 39.0 - 52.0 %   MCV 84.4 80.0 - 100.0 fL  MCH 28.0 26.0 - 34.0 pg   MCHC 33.2 30.0 - 36.0 g/dL   RDW 17.1 (H) 11.5 - 15.5 %   Platelets 123 (L) 150 - 400 K/uL    Comment: REPEATED TO VERIFY   nRBC 0.0 0.0 - 0.2 %    Comment: Performed at [facility], [place] [address], [place], [place] [address]  Lipase, blood     Status: Abnormal   Collection Time: 05/14/22  2:18 AM  Result Value Ref Range   Lipase 380 (H) 11 - 51 U/L    Comment: RESULTS CONFIRMED BY MANUAL DILUTION Performed at [facility], [place] [address], [place], [place] [address]   Comprehensive metabolic panel     Status: Abnormal   Collection Time: 05/15/22  8:06 AM  Result Value Ref Range   Sodium 135 135 - 145 mmol/L   Potassium 3.9 3.5 - 5.1 mmol/L   Chloride 99 98 - 111 mmol/L   CO2 19 (L) 22 - 32 mmol/L   Glucose,  Bld 90 70 - 99 mg/dL    Comment: Glucose reference range applies only to samples taken after fasting for at least 8 hours.   BUN 13 6 - 20 mg/dL   Creatinine, Ser 1.31 (H) 0.61 - 1.24 mg/dL   Calcium 9.0 8.9 - 10.3 mg/dL   Total Protein 5.8 (L) 6.5 - 8.1 g/dL   Albumin 2.9 (L) 3.5 - 5.0 g/dL   AST 99 (H) 15 - 41 U/L   ALT 127 (H) 0 - 44 U/L   Alkaline Phosphatase 304 (H) 38 - 126 U/L   Total Bilirubin 4.2 (H) 0.3 - 1.2 mg/dL   GFR, Estimated >60 >60 mL/min    Comment: (NOTE) Calculated using the CKD-EPI Creatinine Equation (2021)    Anion gap 17 (H) 5 - 15    Comment: Performed at [facility], [place] [address] [place], [place] [address]  CBC     Status: Abnormal   Collection Time: 05/15/22  8:06 AM  Result Value Ref Range   WBC 6.4 4.0 - 10.5 K/uL   RBC 5.48 4.22 - 5.81 MIL/uL   Hemoglobin 14.9 13.0 - 17.0 g/dL   HCT 46.2 39.0 - 52.0 %   MCV 84.3 80.0 - 100.0 fL   MCH 27.2 26.0 - 34.0 pg   MCHC 32.3 30.0 - 36.0 g/dL   RDW 16.9 (H) 11.5 - 15.5 %   Platelets 130 (L) 150 - 400 K/uL   nRBC 0.0 0.0 - 0.2 %    Comment: Performed at [facility], [place] [address] [place], [place] [address]  Lipase, blood     Status: Abnormal   Collection Time: 05/15/22  8:12 AM  Result Value Ref Range   Lipase 71 (H) 11 - 51 U/L    Comment: Performed at [place] [address], [place], [place] [address]   MR ABDOMEN MRCP W WO CONTAST  Result Date: 05/14/2022 CLINICAL DATA:  Cholelithiasis, evaluate for choledocholithiasis EXAM: MRI ABDOMEN WITHOUT AND WITH CONTRAST (INCLUDING MRCP) TECHNIQUE: Multiplanar multisequence MR imaging of the abdomen was performed both before and after the administration of intravenous contrast. Heavily T2-weighted images of the biliary and pancreatic ducts were obtained, and three-dimensional MRCP images were rendered by post processing. CONTRAST:  83m GADAVIST GADOBUTROL 1 MMOL/ML IV SOLN COMPARISON:  CT abdomen/pelvis dated 05/13/2022 FINDINGS:  Motion degraded images. Lower chest: Lung bases are clear. Hepatobiliary: Liver is within normal limits. Gallbladder  is notable for layering gallstones (series 3/image 29), mild gallbladder distension, and mild gallbladder wall thickening. No definite pericholecystic inflammatory changes. Mild periportal edema. No intrahepatic or extrahepatic ductal dilatation. Common duct measures 5 mm. No choledocholithiasis is seen. Pancreas:  Within normal limits. Spleen:  Within normal limits. Adrenals/Urinary Tract:  Adrenal glands are within normal limits. Left renal sinus cysts. Right upper pole renal cysts measuring up to 18 mm (series 3/image 34), simple, benign (Bosniak I). No follow-up is recommended. No hydronephrosis. Stomach/Bowel: Stomach is within normal limits. Visualized bowel is unremarkable. Vascular/Lymphatic:  No evidence of abdominal aortic aneurysm. No suspicious abdominal lymphadenopathy. Other:  No abdominal ascites. Musculoskeletal: No focal osseous lesions. IMPRESSION: Motion degraded images. Cholelithiasis, with mild gallbladder distension and mild gallbladder wall thickening. No definite pericholecystic inflammatory changes to suggest early acute cholecystitis. No intrahepatic or extrahepatic ductal dilatation. Common duct measures 5 mm. No choledocholithiasis is seen. Electronically Signed   By: Julian Hy M.D.   On: 05/14/2022 02:34   CT ABDOMEN PELVIS W CONTRAST  Result Date: 05/13/2022 CLINICAL DATA:  Epigastric abdominal pain. EXAM: CT ABDOMEN AND PELVIS WITH CONTRAST TECHNIQUE: Multidetector CT imaging of the abdomen and pelvis was performed using the standard protocol following bolus administration of intravenous contrast. RADIATION DOSE REDUCTION: This exam was performed according to the departmental dose-optimization program which includes automated exposure control, adjustment of the mA and/or kV according to patient size and/or use of iterative reconstruction technique. CONTRAST:   33m OMNIPAQUE IOHEXOL 350 MG/ML SOLN COMPARISON:  May 12, 2022. FINDINGS: Lower chest: No acute abnormality. Hepatobiliary: Cholelithiasis and stable gallbladder distention is noted. No biliary dilatation is noted. Liver is unremarkable. Pancreas: Unremarkable. No pancreatic ductal dilatation or surrounding inflammatory changes. Spleen: Normal in size without focal abnormality. Adrenals/Urinary Tract: Adrenal glands appear normal. Stable small bilateral renal cysts are noted for which no further follow-up is required. No hydronephrosis or renal obstruction is noted. Urinary bladder is unremarkable. Stomach/Bowel: Stomach is within normal limits. Appendix appears normal. No evidence of bowel wall thickening, distention, or inflammatory changes. Stable postsurgical changes involving distal ileum and sigmoid colon. Vascular/Lymphatic: No significant vascular findings are present. Stable minimally enlarged mesenteric lymph nodes which most likely are reactive in etiology. Reproductive: Prostate is unremarkable. Other: Small fat containing periumbilical hernia. No ascites is noted. Musculoskeletal: No acute or significant osseous findings. IMPRESSION: Mild cholelithiasis and gallbladder is distention is noted which is unchanged compared to prior exam performed yesterday. Biliary dilatation is noted. Gastric distention noted on prior exam of yesterday is resolved. Small bilateral renal cysts are noted which are unchanged compared to prior exam performed yesterday. No further follow-up is required Small fat containing periumbilical hernia is noted which is unchanged compared to prior exam performed yesterday. Electronically Signed   By: JMarijo ConceptionM.D.   On: 05/13/2022 18:37    Pending Labs Unresulted Labs (From admission, onward)     Start     Ordered   05/16/22 0500  CBC  Tomorrow morning,   R        05/15/22 0821   05/16/22 0500  Comprehensive metabolic panel  Tomorrow morning,   R        05/15/22  0821   05/16/22 0500  Lipase, blood  Tomorrow morning,   R        05/15/22 0821   05/13/22 2310  Differential  Once,   AD        05/13/22 2310   05/13/22 2310  Technologist smear review  Once,   AD        05/13/22 2310            Vitals/Pain Today's Vitals   05/15/22 0605 05/15/22 0605 05/15/22 0830 05/15/22 0857  BP:   (!) 168/101 (!) 190/99  Pulse:    65  Resp:   11 19  Temp:    97.7 F (36.5 C)  TempSrc:    Oral  SpO2:    94%  Weight:      Height:      PainSc: Asleep Asleep      Isolation Precautions No active isolations  Medications Medications  0.9 %  sodium chloride infusion (0 mLs Intravenous Stopped 05/14/22 0624)  metoprolol succinate (TOPROL-XL) 24 hr tablet 50 mg (50 mg Oral Given 05/15/22 1031)  irbesartan (AVAPRO) tablet 150 mg (150 mg Oral Given 05/15/22 1031)  zolpidem (AMBIEN) tablet 10 mg (has no administration in time range)  oxymetazoline (AFRIN) 0.05 % nasal spray 1 spray (has no administration in time range)  ipratropium-albuterol (DUONEB) 0.5-2.5 (3) MG/3ML nebulizer solution 3 mL (has no administration in time range)  HYDROmorphone (DILAUDID) injection 1 mg (1 mg Intravenous Given 05/15/22 1039)  ondansetron (ZOFRAN) tablet 4 mg ( Oral See Alternative 05/15/22 1037)    Or  ondansetron (ZOFRAN) injection 4 mg (4 mg Intravenous Given 05/15/22 1037)  pantoprazole (PROTONIX) injection 40 mg (40 mg Intravenous Given 05/15/22 1035)  metroNIDAZOLE (FLAGYL) IVPB 500 mg (500 mg Intravenous New Bag/Given 05/15/22 1034)  ciprofloxacin (CIPRO) IVPB 400 mg (400 mg Intravenous New Bag/Given 05/15/22 1045)  0.9 %  sodium chloride infusion ( Intravenous New Bag/Given 05/14/22 1851)  HYDROmorphone (DILAUDID) injection 1 mg (1 mg Intravenous Given 05/13/22 1504)  ondansetron (ZOFRAN) injection 4 mg (4 mg Intravenous Given 05/13/22 1501)  sodium chloride 0.9 % bolus 1,000 mL (0 mLs Intravenous Stopped 05/13/22 1803)  pantoprazole (PROTONIX) injection 40 mg (40 mg Intravenous  Given 05/13/22 1603)  dicyclomine (BENTYL) capsule 10 mg (10 mg Oral Given 05/13/22 1604)  ondansetron (ZOFRAN) injection 4 mg (4 mg Intravenous Given 05/13/22 1644)  HYDROmorphone (DILAUDID) injection 1 mg (1 mg Intravenous Given 05/13/22 1807)  sodium chloride 0.9 % bolus 1,000 mL (0 mLs Intravenous Stopped 05/13/22 1953)  iohexol (OMNIPAQUE) 350 MG/ML injection 75 mL (75 mLs Intravenous Contrast Given 05/13/22 1822)  ondansetron (ZOFRAN) injection 4 mg (4 mg Intravenous Given 05/13/22 2000)  HYDROmorphone (DILAUDID) injection 1 mg (1 mg Intravenous Given 05/13/22 2105)  LORazepam (ATIVAN) injection 1 mg (1 mg Intravenous Given 05/14/22 0115)  gadobutrol (GADAVIST) 1 MMOL/ML injection 10 mL (10 mLs Intravenous Contrast Given 05/14/22 0210)    Mobility walks     Focused Assessments Gastro/pain   R Recommendations: See Admitting Provider Note  Report given to:   Additional Notes: Patient is lovely, AOX4, walky/talky, able to answer all questions and verbalize needs

## 2022-05-15 NOTE — Plan of Care (Signed)

## 2022-05-15 NOTE — Progress Notes (Signed)
[facility] Progress Note     Subjective: CC-  Abdominal pain unchanged, continues to have epigastric pain requiring pain medication.  Objective: Vital signs in last 24 hours: Temp:  [98 F (36.7 C)-98.7 F (37.1 C)] 98.6 F (37 C) (01/29 0342) Pulse Rate:  [64-85] 84 (01/29 0342) Resp:  [12-19] 19 (01/29 0342) BP: (140-180)/(83-114) 143/83 (01/29 0342) SpO2:  [94 %-98 %] 97 % (01/29 0342) FiO2 (%):  [21 %] 21 % (01/29 0021)    Intake/Output from previous day: 01/28 0701 - 01/29 0700 In: 300 [IV Piggyback:300] Out: -  Intake/Output this shift: No intake/output data recorded.  PE: Gen:  Alert, NAD, pleasant Abd: soft, ND, mild epigastric TTP without rebound or guarding  Lab Results:  Recent Labs    05/13/22 1439 05/14/22 0218  WBC 7.9 6.1  HGB 17.3* 15.6  HCT 52.1* 47.0  PLT 142* 123*   BMET Recent Labs    05/13/22 1439 05/14/22 0218  NA 136 136  K 3.8 3.9  CL 104 105  CO2 22 20*  GLUCOSE 106* 112*  BUN 14 12  CREATININE 1.33* 1.44*  CALCIUM 8.7* 8.3*   PT/INR Recent Labs    05/13/22 2102  LABPROT 15.6*  INR 1.3*   CMP     Component Value Date/Time   NA 136 05/14/2022 0218   NA 140 04/24/2022 0931   K 3.9 05/14/2022 0218   CL 105 05/14/2022 0218   CO2 20 (L) 05/14/2022 0218   GLUCOSE 112 (H) 05/14/2022 0218   BUN 12 05/14/2022 0218   BUN 23 04/24/2022 0931   CREATININE 1.44 (H) 05/14/2022 0218   CALCIUM 8.3 (L) 05/14/2022 0218   PROT 6.0 (L) 05/14/2022 0218   PROT 7.1 04/24/2022 0931   ALBUMIN 3.0 (L) 05/14/2022 0218   ALBUMIN 4.5 04/24/2022 0931   AST 241 (H) 05/14/2022 0218   ALT 196 (H) 05/14/2022 0218   ALKPHOS 286 (H) 05/14/2022 0218   BILITOT 8.1 (H) 05/14/2022 0218   BILITOT 0.7 04/24/2022 0931   GFRNONAA 58 (L) 05/14/2022 0218   GFRAA 69 03/26/2020 1154   Lipase     Component Value Date/Time   LIPASE 380 (H) 05/14/2022 0218       Studies/Results: MR ABDOMEN MRCP W WO CONTAST  Result Date:  05/14/2022 CLINICAL DATA:  Cholelithiasis, evaluate for choledocholithiasis EXAM: MRI ABDOMEN WITHOUT AND WITH CONTRAST (INCLUDING MRCP) TECHNIQUE: Multiplanar multisequence MR imaging of the abdomen was performed both before and after the administration of intravenous contrast. Heavily T2-weighted images of the biliary and pancreatic ducts were obtained, and three-dimensional MRCP images were rendered by post processing. CONTRAST:  26m GADAVIST GADOBUTROL 1 MMOL/ML IV SOLN COMPARISON:  CT abdomen/pelvis dated 05/13/2022 FINDINGS: Motion degraded images. Lower chest: Lung bases are clear. Hepatobiliary: Liver is within normal limits. Gallbladder is notable for layering gallstones (series 3/image 29), mild gallbladder distension, and mild gallbladder wall thickening. No definite pericholecystic inflammatory changes. Mild periportal edema. No intrahepatic or extrahepatic ductal dilatation. Common duct measures 5 mm. No choledocholithiasis is seen. Pancreas:  Within normal limits. Spleen:  Within normal limits. Adrenals/Urinary Tract:  Adrenal glands are within normal limits. Left renal sinus cysts. Right upper pole renal cysts measuring up to 18 mm (series 3/image 34), simple, benign (Bosniak I). No follow-up is recommended. No hydronephrosis. Stomach/Bowel: Stomach is within normal limits. Visualized bowel is unremarkable. Vascular/Lymphatic:  No evidence of abdominal aortic aneurysm. No suspicious abdominal lymphadenopathy. Other:  No abdominal ascites. Musculoskeletal: No focal osseous lesions.  IMPRESSION: Motion degraded images. Cholelithiasis, with mild gallbladder distension and mild gallbladder wall thickening. No definite pericholecystic inflammatory changes to suggest early acute cholecystitis. No intrahepatic or extrahepatic ductal dilatation. Common duct measures 5 mm. No choledocholithiasis is seen. Electronically Signed   By: Julian Hy M.D.   On: 05/14/2022 02:34   CT ABDOMEN PELVIS W  CONTRAST  Result Date: 05/13/2022 CLINICAL DATA:  Epigastric abdominal pain. EXAM: CT ABDOMEN AND PELVIS WITH CONTRAST TECHNIQUE: Multidetector CT imaging of the abdomen and pelvis was performed using the standard protocol following bolus administration of intravenous contrast. RADIATION DOSE REDUCTION: This exam was performed according to the departmental dose-optimization program which includes automated exposure control, adjustment of the mA and/or kV according to patient size and/or use of iterative reconstruction technique. CONTRAST:  79m OMNIPAQUE IOHEXOL 350 MG/ML SOLN COMPARISON:  May 12, 2022. FINDINGS: Lower chest: No acute abnormality. Hepatobiliary: Cholelithiasis and stable gallbladder distention is noted. No biliary dilatation is noted. Liver is unremarkable. Pancreas: Unremarkable. No pancreatic ductal dilatation or surrounding inflammatory changes. Spleen: Normal in size without focal abnormality. Adrenals/Urinary Tract: Adrenal glands appear normal. Stable small bilateral renal cysts are noted for which no further follow-up is required. No hydronephrosis or renal obstruction is noted. Urinary bladder is unremarkable. Stomach/Bowel: Stomach is within normal limits. Appendix appears normal. No evidence of bowel wall thickening, distention, or inflammatory changes. Stable postsurgical changes involving distal ileum and sigmoid colon. Vascular/Lymphatic: No significant vascular findings are present. Stable minimally enlarged mesenteric lymph nodes which most likely are reactive in etiology. Reproductive: Prostate is unremarkable. Other: Small fat containing periumbilical hernia. No ascites is noted. Musculoskeletal: No acute or significant osseous findings. IMPRESSION: Mild cholelithiasis and gallbladder is distention is noted which is unchanged compared to prior exam performed yesterday. Biliary dilatation is noted. Gastric distention noted on prior exam of yesterday is resolved. Small bilateral  renal cysts are noted which are unchanged compared to prior exam performed yesterday. No further follow-up is required Small fat containing periumbilical hernia is noted which is unchanged compared to prior exam performed yesterday. Electronically Signed   By: JMarijo ConceptionM.D.   On: 05/13/2022 18:37    Anti-infectives: Anti-infectives (From admission, onward)    Start     Dose/Rate Route Frequency Ordered Stop   05/13/22 2230  metroNIDAZOLE (FLAGYL) IVPB 500 mg        500 mg 100 mL/hr over 60 Minutes Intravenous Every 12 hours 05/13/22 2218     05/13/22 2230  ciprofloxacin (CIPRO) IVPB 400 mg        400 mg 200 mL/hr over 60 Minutes Intravenous Every 12 hours 05/13/22 2229          Assessment/Plan Gallstone pancreatitis Elevated LFTs, hyperbilirubinemia  - MRCP negative for choledocholithiasis, no intrahepatic or extrahepatic ductal dilatation - Labs pending this morning but patient has ongoing epigastric abdominal pain and tenderness. Continue supportive care for pancreatitis, NPO, IVF. Plan for lap chole this admission once pancreatitis improves, but he is not ready for OR today. Repeat labs in AM.  ID - cipro/flagyl FEN - IVF, NPO VTE - SCDs, ok for chemical dvt ppx from surgical standpoint Foley - none  Hx Ascending aortic aneurysm s/p repair  HTN HLD GERD OSA CKD-III Thrombocytopenia  I reviewed hospitalist notes, last 24 h vitals and pain scores, last 48 h intake and output, last 24 h labs and trends, and last 24 h imaging results.    LOS: 2 days    BWellington Hampshire PA-C  [facility] 05/15/2022, 8:19 AM Please see Amion for pager number during day hours 7:00am-4:30pm

## 2022-05-15 NOTE — ED Notes (Signed)
Patient left the floor with staff in stable condition, no s/s of distress.

## 2022-05-15 NOTE — Progress Notes (Signed)
Visit made to patients room to assist with CPAP.  Patient stated his son brought his home unit in today and he don't want to wear it tonight.  Patient states he don't wear it every night even when at home.  Patient advised if he changes his mind to have RN call RT.

## 2022-05-15 NOTE — Progress Notes (Signed)
PROGRESS NOTE    Clayton Hernandez  HEN:[id number] DOB: [birth year] DOA: 05/13/2022 PCP: [name], MD   Brief Narrative:  The patient is a 53 year old overweight Caucasian male with a past medical history significant for but not limited to stage IIIa chronic kidney disease, NAFLD, hypertension, GERD, history of colon cancer, hyperlipidemia, OSA, ascending aortic aneurysm as well as other comorbidities who presented to the ED with complaint of abdominal pain.  Came to the ED yesterday and was diagnosed with gallstones and had outpatient follow-up with surgery but he worsened and had acute pain started around 5 PM and so he presented to the ED.  He had associated nausea, but no vomiting.  Pain was 10 out of 10 in severity with intensity coming on but he states that it is a constant pain.  The patient woke up yesterday morning and his urine was brown and he had 5 movements that were hard and discolored and he had noticed some blood in his urine and continued to have pain that caused him to double over so EMS was called.  He was admitted for further evaluation and a CT scan of the abdomen pelvis was done which showed mild cholelithiasis and gallbladder distention which was unchanged from the day before.  She did not biliary distention as well.  General surgery was consulted and given IV fluids and MRCP was done.  General surgery planning on cholecystectomy once his gallstone pancreatitis improves.  General surgery feels that this is likely be complicated surgery given the extent of his previous abdominal surgeries and recommending conservative management at this time with bowel rest and fluid hydration.   Currently they are recommending bowel rest and fluid hydration as well as pain control and recommending pursuing a laparoscopic cholecystectomy once LFTs and lipase normalize.   Assessment and Plan: * Cholelithiasis with concern for choledocholithiasis Acute Pancreatitis  -52 year old  presenting with acute onset of abdominal pain, jaundice, nausea found to have cholelithiasis with high suspicion for choledocholithiasis  -Admit to med-surg -General surgery consulted -CT shows no cholecystitis, but mild cholelithiasis and gallbladder distention. Biliary dilation noted. Concern for stone in bile duct with his jaundice and t.bili to 8.7. lipase at 661. -Lipase now trended down to 71  -MRCP done and showed "Motion degraded images. Cholelithiasis, with mild gallbladder distension and mild gallbladder wall thickening. No definite pericholecystic inflammatory changes to suggest early acute cholecystitis. No intrahepatic or extrahepatic ductal dilatation. Common duct measures 5 mm. No choledocholithiasis is seen. -if +, will need GI consult for ERCP -continue NPO except for ice chips and meds -IVF with NS -pain medication with dilaudid as he is in significant pain  -fever to 100.3 on arrival and diaphoretic, start cipro and flagyl to cover and can de escalate if cholecystitis ruled out. Anaphylaxis allergy to PCN  -Neurosurgery team has evaluated and recommending continue bowel rest and IV fluid hydration and allowing LFTs and lipase are normal And planning for laparoscopic cholecystectomy   Erythrocytosis -States he has had this "forever" Never seen a hematologist for his polycythemia or thrombocytopenia.  ? Secondary to OSA vs. Pulmonary disease  -Hgb/Hct Trend: Recent Labs  Lab 04/24/22 0931 05/12/22 0040 05/12/22 0040 05/12/22 0122 05/13/22 1439 05/14/22 0218 05/15/22 0806  HGB 18.0* 15.7  --  16.0 17.3* 15.6 14.9  HCT 56.5* 48.8   < > 47.0 52.1* 47.0 46.2  MCV 83 84.6   < >  --  83.8 84.4 84.3   < > = values  in this interval not displayed.  -Recommend f/u with heme/onc outpatient    Ascending aortic aneurysm s/p repair -Done at [facility]  -CT done this Hospitalization shows 4.0>4.2 -Recommend yearly surveillance    Essential hypertension -Stable  -C/w irbesartan  150 mg p.o. daily, metoprolol succinate 50 mL p.o. daily -Continue to monitor blood pressures per protocol -Last BP reading was elevated at 172/101 -Added IV Hydralazine 10 mg q6hprn for SBP >160 or DBP>100   GERD (gastroesophageal reflux disease) -Continue PPI with Pantoprazole but changed to IV    Dyslipidemia -Continue to Hold statin with elevated liver enzymes    OSA (obstructive sleep apnea) -Continue CPAP   CKD (chronic kidney disease) stage 3, GFR 30-59 ml/min (HCC) Metabolic Acidosis  -BUN/Cr Trend: Recent Labs  Lab 04/24/22 0931 05/12/22 0040 05/12/22 0122 05/13/22 1439 05/14/22 0218 05/15/22 0806  BUN 23 19 24* '14 12 13  '$ CREATININE 1.42* 1.50* 1.40* 1.33* 1.44* 1.31*  -Avoid Nephrotoxic Medications, Contrast Dyes, Hypotension and Dehydration to Ensure Adequate Renal Perfusion and will need to Renally Adjust Meds -Patient has a Metabolic Acidosis with a CO2 with 19, AG of 17, and Chloride Level of 99 -Continue to Monitor and Trend Renal Function carefully and repeat CMP in the AM    Thrombocytopenia -Platelet Count Trend: Recent Labs  Lab 04/24/22 0931 05/12/22 0040 05/13/22 1439 05/14/22 0218 05/15/22 0806  PLT 132* 132* 142* 123* 130*  -States that this is Chronic -Will check B12/smear and HIV -Does have NAFLD and could be secondary to this  -Does not drink alcohol  -Recommend outpatient heme f/u    Hyperbilirbuinemia Abnormal LFTs -LFT and Bilirubin Trend: Recent Labs  Lab 04/24/22 0931 04/24/22 0931 05/12/22 0040 05/13/22 1439 05/14/22 0218 05/15/22 0806  AST 33  --  223* 343* 241* 99*  ALT 24  --  104* 246* 196* 127*  BILITOT 0.7   < > 2.4* 8.7* 8.1* 4.2*   < > = values in this interval not displayed.  -Continue to monitor and trend hepatic function panel carefully and he had an MRCP as above -Repeat CMP in the a.m.   Hypoalbuminemia -Patient's Albumin Level Trend Recent Labs  Lab 04/24/22 0931 05/12/22 0040 05/13/22 1439  05/14/22 0218 05/15/22 0806  ALBUMIN 4.5 3.5 3.6 3.0* 2.9*  -Continue to Monitor and Trend and repeat CMP in the AM   Overweight -Complicates overall prognosis and care -Estimated body mass index is 28.87 kg/m as calculated from the following:   Height as of this encounter: '6\' 2"'$  (1.88 m).   Weight as of this encounter: 102 kg.  -Weight Loss and Dietary Counseling given  DVT prophylaxis: SCDs Start: 05/13/22 2108    Code Status: Full Code Family Communication: No family currently at bedside  Disposition Plan:  Level of care: Med-Surg Status is: Inpatient Remains inpatient appropriate because: Needs further clinical improvement and clearance by general surgery and anticipating surgical intervention prior to discharge   Consultants:  General surgery  Procedures:  As delineated as above  Antimicrobials:  Anti-infectives (From admission, onward)    Start     Dose/Rate Route Frequency Ordered Stop   05/13/22 2230  metroNIDAZOLE (FLAGYL) IVPB 500 mg        500 mg 100 mL/hr over 60 Minutes Intravenous Every 12 hours 05/13/22 2218     05/13/22 2230  ciprofloxacin (CIPRO) IVPB 400 mg        400 mg 200 mL/hr over 60 Minutes Intravenous Every 12 hours 05/13/22 2229  Subjective: Seen and examined at bedside and he states that he is still having some abdominal discomfort but thinks he is doing better.  No nausea or vomiting.  No chest pain or shortness of breath.  Happy that his LFTs and his bilirubin are trending down.  No other concerns or complaints at this time.  Objective: Vitals:   05/15/22 0342 05/15/22 0830 05/15/22 0857 05/15/22 1527  BP: (!) 143/83 (!) 168/101 (!) 190/99 (!) 172/101  Pulse: 84  65 67  Resp: '19 11 19 18  '$ Temp: 98.6 F (37 C)  97.7 F (36.5 C) 98.2 F (36.8 C)  TempSrc: Oral  Oral Oral  SpO2: 97%  94% 98%  Weight:      Height:       No intake or output data in the 24 hours ending 05/15/22 1558 Filed Weights   05/13/22 1422  Weight:  102 kg   Examination: Physical Exam:  Constitutional: WN/WD overweight Caucasian male who appears a little bit more comfortable than yesterday Respiratory: Diminished to auscultation bilaterally with some coarse breath sounds, no wheezing, rales, rhonchi or crackles. Normal respiratory effort and patient is not tachypenic. No accessory muscle use.  Unlabored breathing Cardiovascular: RRR, no murmurs / rubs / gallops. S1 and S2 auscultated. No extremity edema.  Abdomen: Soft, tender to palpate, distended secondary to body habitus bowel sounds positive.  GU: Deferred. Musculoskeletal: No clubbing / cyanosis of digits/nails. No joint deformity upper and lower extremities.  Skin: No rashes, lesions, ulcers lon a limited skin evaluation. No induration; Warm and dry.  Neurologic: CN 2-12 grossly intact with no focal deficits. Romberg sign and cerebellar reflexes not assessed.  Psychiatric: Normal judgment and insight. Alert and oriented x 3. Normal mood and appropriate affect.   Data Reviewed: I have personally reviewed following labs and imaging studies  CBC: Recent Labs  Lab 05/12/22 0040 05/12/22 0122 05/13/22 1439 05/14/22 0218 05/15/22 0806  WBC 8.9  --  7.9 6.1 6.4  NEUTROABS 7.2  --   --   --   --   HGB 15.7 16.0 17.3* 15.6 14.9  HCT 48.8 47.0 52.1* 47.0 46.2  MCV 84.6  --  83.8 84.4 84.3  PLT 132*  --  142* 123* 465*   Basic Metabolic Panel: Recent Labs  Lab 05/12/22 0040 05/12/22 0122 05/13/22 1439 05/14/22 0218 05/15/22 0806  NA 138 141 136 136 135  K 3.9 4.0 3.8 3.9 3.9  CL 106 107 104 105 99  CO2 22  --  22 20* 19*  GLUCOSE 186* 156* 106* 112* 90  BUN 19 24* '14 12 13  '$ CREATININE 1.50* 1.40* 1.33* 1.44* 1.31*  CALCIUM 8.6*  --  8.7* 8.3* 9.0   GFR: Estimated Creatinine Clearance: 83.1 mL/min (A) (by C-G formula based on SCr of 1.31 mg/dL (H)). Liver Function Tests: Recent Labs  Lab 05/12/22 0040 05/13/22 1439 05/14/22 0218 05/15/22 0806  AST 223* 343*  241* 99*  ALT 104* 246* 196* 127*  ALKPHOS 186* 336* 286* 304*  BILITOT 2.4* 8.7* 8.1* 4.2*  PROT 6.1* 6.4* 6.0* 5.8*  ALBUMIN 3.5 3.6 3.0* 2.9*   Recent Labs  Lab 05/12/22 0040 05/13/22 1439 05/14/22 0218 05/15/22 0812  LIPASE 46 661* 380* 71*   No results for input(s): "AMMONIA" in the last 168 hours. Coagulation Profile: Recent Labs  Lab 05/13/22 2102  INR 1.3*   Cardiac Enzymes: No results for input(s): "CKTOTAL", "CKMB", "CKMBINDEX", "TROPONINI" in the last 168 hours. BNP (last 3 results)  No results for input(s): "PROBNP" in the last 8760 hours. HbA1C: No results for input(s): "HGBA1C" in the last 72 hours. CBG: No results for input(s): "GLUCAP" in the last 168 hours. Lipid Profile: No results for input(s): "CHOL", "HDL", "LDLCALC", "TRIG", "CHOLHDL", "LDLDIRECT" in the last 72 hours. Thyroid Function Tests: No results for input(s): "TSH", "T4TOTAL", "FREET4", "T3FREE", "THYROIDAB" in the last 72 hours. Anemia Panel: No results for input(s): "VITAMINB12", "FOLATE", "FERRITIN", "TIBC", "IRON", "RETICCTPCT" in the last 72 hours. Sepsis Labs: No results for input(s): "PROCALCITON", "LATICACIDVEN" in the last 168 hours.  No results found for this or any previous visit (from the past 240 hour(s)).   Radiology Studies: MR ABDOMEN MRCP W WO CONTAST  Result Date: 05/14/2022 CLINICAL DATA:  Cholelithiasis, evaluate for choledocholithiasis EXAM: MRI ABDOMEN WITHOUT AND WITH CONTRAST (INCLUDING MRCP) TECHNIQUE: Multiplanar multisequence MR imaging of the abdomen was performed both before and after the administration of intravenous contrast. Heavily T2-weighted images of the biliary and pancreatic ducts were obtained, and three-dimensional MRCP images were rendered by post processing. CONTRAST:  38m GADAVIST GADOBUTROL 1 MMOL/ML IV SOLN COMPARISON:  CT abdomen/pelvis dated 05/13/2022 FINDINGS: Motion degraded images. Lower chest: Lung bases are clear. Hepatobiliary: Liver is  within normal limits. Gallbladder is notable for layering gallstones (series 3/image 29), mild gallbladder distension, and mild gallbladder wall thickening. No definite pericholecystic inflammatory changes. Mild periportal edema. No intrahepatic or extrahepatic ductal dilatation. Common duct measures 5 mm. No choledocholithiasis is seen. Pancreas:  Within normal limits. Spleen:  Within normal limits. Adrenals/Urinary Tract:  Adrenal glands are within normal limits. Left renal sinus cysts. Right upper pole renal cysts measuring up to 18 mm (series 3/image 34), simple, benign (Bosniak I). No follow-up is recommended. No hydronephrosis. Stomach/Bowel: Stomach is within normal limits. Visualized bowel is unremarkable. Vascular/Lymphatic:  No evidence of abdominal aortic aneurysm. No suspicious abdominal lymphadenopathy. Other:  No abdominal ascites. Musculoskeletal: No focal osseous lesions. IMPRESSION: Motion degraded images. Cholelithiasis, with mild gallbladder distension and mild gallbladder wall thickening. No definite pericholecystic inflammatory changes to suggest early acute cholecystitis. No intrahepatic or extrahepatic ductal dilatation. Common duct measures 5 mm. No choledocholithiasis is seen. Electronically Signed   By: SJulian HyM.D.   On: 05/14/2022 02:34   CT ABDOMEN PELVIS W CONTRAST  Result Date: 05/13/2022 CLINICAL DATA:  Epigastric abdominal pain. EXAM: CT ABDOMEN AND PELVIS WITH CONTRAST TECHNIQUE: Multidetector CT imaging of the abdomen and pelvis was performed using the standard protocol following bolus administration of intravenous contrast. RADIATION DOSE REDUCTION: This exam was performed according to the departmental dose-optimization program which includes automated exposure control, adjustment of the mA and/or kV according to patient size and/or use of iterative reconstruction technique. CONTRAST:  724mOMNIPAQUE IOHEXOL 350 MG/ML SOLN COMPARISON:  May 12, 2022. FINDINGS:  Lower chest: No acute abnormality. Hepatobiliary: Cholelithiasis and stable gallbladder distention is noted. No biliary dilatation is noted. Liver is unremarkable. Pancreas: Unremarkable. No pancreatic ductal dilatation or surrounding inflammatory changes. Spleen: Normal in size without focal abnormality. Adrenals/Urinary Tract: Adrenal glands appear normal. Stable small bilateral renal cysts are noted for which no further follow-up is required. No hydronephrosis or renal obstruction is noted. Urinary bladder is unremarkable. Stomach/Bowel: Stomach is within normal limits. Appendix appears normal. No evidence of bowel wall thickening, distention, or inflammatory changes. Stable postsurgical changes involving distal ileum and sigmoid colon. Vascular/Lymphatic: No significant vascular findings are present. Stable minimally enlarged mesenteric lymph nodes which most likely are reactive in etiology. Reproductive: Prostate is unremarkable. Other: Small  fat containing periumbilical hernia. No ascites is noted. Musculoskeletal: No acute or significant osseous findings. IMPRESSION: Mild cholelithiasis and gallbladder is distention is noted which is unchanged compared to prior exam performed yesterday. Biliary dilatation is noted. Gastric distention noted on prior exam of yesterday is resolved. Small bilateral renal cysts are noted which are unchanged compared to prior exam performed yesterday. No further follow-up is required Small fat containing periumbilical hernia is noted which is unchanged compared to prior exam performed yesterday. Electronically Signed   By: Marijo Conception M.D.   On: 05/13/2022 18:37    Scheduled Meds:  irbesartan  150 mg Oral Daily   metoprolol succinate  50 mg Oral Daily   pantoprazole (PROTONIX) IV  40 mg Intravenous Q24H   Continuous Infusions:  sodium chloride 75 mL/hr at 05/14/22 1851   ciprofloxacin 400 mg (05/15/22 1045)   metronidazole Stopped (05/15/22 1155)    LOS: 2 days    Raiford Noble, DO [facility] Available via Epic secure chat 7am-7pm After these hours, please refer to coverage provider listed on [email or web address] 05/15/2022, 3:58 PM

## 2022-05-16 ENCOUNTER — Encounter (HOSPITAL_COMMUNITY): Payer: Self-pay | Admitting: Family Medicine

## 2022-05-16 ENCOUNTER — Inpatient Hospital Stay (HOSPITAL_COMMUNITY): Payer: BC Managed Care – PPO

## 2022-05-16 ENCOUNTER — Inpatient Hospital Stay (HOSPITAL_COMMUNITY): Payer: BC Managed Care – PPO | Admitting: Certified Registered"

## 2022-05-16 ENCOUNTER — Encounter (HOSPITAL_COMMUNITY)
Admission: EM | Disposition: A | Discharge status: Home / Self Care | Payer: Self-pay | Source: Home / Self Care | Attending: Internal Medicine

## 2022-05-16 ENCOUNTER — Other Ambulatory Visit: Payer: Self-pay

## 2022-05-16 DIAGNOSIS — K8070 Calculus of gallbladder and bile duct without cholecystitis without obstruction: Secondary | ICD-10-CM | POA: Diagnosis not present

## 2022-05-16 DIAGNOSIS — D696 Thrombocytopenia, unspecified: Secondary | ICD-10-CM | POA: Diagnosis not present

## 2022-05-16 DIAGNOSIS — I7121 Aneurysm of the ascending aorta, without rupture: Secondary | ICD-10-CM | POA: Diagnosis not present

## 2022-05-16 DIAGNOSIS — E785 Hyperlipidemia, unspecified: Secondary | ICD-10-CM | POA: Diagnosis not present

## 2022-05-16 HISTORY — PX: CHOLECYSTECTOMY: SHX55

## 2022-05-16 LAB — CBC WITH DIFFERENTIAL/PLATELET
Abs Immature Granulocytes: 0.03 10*3/uL (ref 0.00–0.07)
Basophils Absolute: 0 10*3/uL (ref 0.0–0.1)
Basophils Relative: 0 %
Eosinophils Absolute: 0 10*3/uL (ref 0.0–0.5)
Eosinophils Relative: 1 %
HCT: 43.6 % (ref 39.0–52.0)
Hemoglobin: 15 g/dL (ref 13.0–17.0)
Immature Granulocytes: 1 %
Lymphocytes Relative: 15 %
Lymphs Abs: 1 10*3/uL (ref 0.7–4.0)
MCH: 27.9 pg (ref 26.0–34.0)
MCHC: 34.4 g/dL (ref 30.0–36.0)
MCV: 81.2 fL (ref 80.0–100.0)
Monocytes Absolute: 0.8 10*3/uL (ref 0.1–1.0)
Monocytes Relative: 11 %
Neutro Abs: 4.8 10*3/uL (ref 1.7–7.7)
Neutrophils Relative %: 72 %
Platelets: 145 10*3/uL — ABNORMAL LOW (ref 150–400)
RBC: 5.37 MIL/uL (ref 4.22–5.81)
RDW: 16.9 % — ABNORMAL HIGH (ref 11.5–15.5)
WBC: 6.6 10*3/uL (ref 4.0–10.5)
nRBC: 0 % (ref 0.0–0.2)

## 2022-05-16 LAB — COMPREHENSIVE METABOLIC PANEL
ALT: 90 U/L — ABNORMAL HIGH (ref 0–44)
AST: 62 U/L — ABNORMAL HIGH (ref 15–41)
Albumin: 2.8 g/dL — ABNORMAL LOW (ref 3.5–5.0)
Alkaline Phosphatase: 290 U/L — ABNORMAL HIGH (ref 38–126)
Anion gap: 11 (ref 5–15)
BUN: 18 mg/dL (ref 6–20)
CO2: 22 mmol/L (ref 22–32)
Calcium: 9 mg/dL (ref 8.9–10.3)
Chloride: 102 mmol/L (ref 98–111)
Creatinine, Ser: 1.19 mg/dL (ref 0.61–1.24)
GFR, Estimated: >60 mL/min (ref >60)
Glucose, Bld: 94 mg/dL (ref 70–99)
Potassium: 3.6 mmol/L (ref 3.5–5.1)
Sodium: 135 mmol/L (ref 135–145)
Total Bilirubin: 3.4 mg/dL — ABNORMAL HIGH (ref 0.3–1.2)
Total Protein: 5.8 g/dL — ABNORMAL LOW (ref 6.5–8.1)

## 2022-05-16 LAB — PHOSPHORUS: Phosphorus: 3.3 mg/dL (ref 2.5–4.6)

## 2022-05-16 LAB — MAGNESIUM: Magnesium: 1.8 mg/dL (ref 1.7–2.4)

## 2022-05-16 LAB — LIPASE, BLOOD: Lipase: 79 U/L — ABNORMAL HIGH (ref 11–51)

## 2022-05-16 SURGERY — LAPAROSCOPIC CHOLECYSTECTOMY WITH INTRAOPERATIVE CHOLANGIOGRAM
Anesthesia: General

## 2022-05-16 MED ORDER — ROCURONIUM BROMIDE 10 MG/ML (PF) SYRINGE
PREFILLED_SYRINGE | INTRAVENOUS | Status: AC
  Filled 2022-05-16: qty 10

## 2022-05-16 MED ORDER — OXYCODONE HCL 5 MG PO TABS: 5.0000 mg | ORAL_TABLET | Freq: Once | ORAL | Status: DC | PRN

## 2022-05-16 MED ORDER — PROMETHAZINE HCL 25 MG/ML IJ SOLN
6.2500 mg | INTRAMUSCULAR | Status: DC | PRN
  Administered 2022-05-16: 12.5 mg via INTRAVENOUS

## 2022-05-16 MED ORDER — MIDAZOLAM HCL 2 MG/2ML IJ SOLN
INTRAMUSCULAR | Status: DC | PRN
  Administered 2022-05-16: 2 mg via INTRAVENOUS

## 2022-05-16 MED ORDER — ACETAMINOPHEN 10 MG/ML IV SOLN
INTRAVENOUS | Status: DC | PRN
  Administered 2022-05-16: 1000 mg via INTRAVENOUS

## 2022-05-16 MED ORDER — SODIUM CHLORIDE 0.9 % IR SOLN
Status: DC | PRN
  Administered 2022-05-16: 1000 mL

## 2022-05-16 MED ORDER — FENTANYL CITRATE (PF) 250 MCG/5ML IJ SOLN
INTRAMUSCULAR | Status: AC
  Filled 2022-05-16: qty 5

## 2022-05-16 MED ORDER — FENTANYL CITRATE (PF) 100 MCG/2ML IJ SOLN
INTRAMUSCULAR | Status: DC | PRN
  Administered 2022-05-16: 100 ug via INTRAVENOUS
  Administered 2022-05-16: 150 ug via INTRAVENOUS
  Administered 2022-05-16: 100 ug via INTRAVENOUS

## 2022-05-16 MED ORDER — LIDOCAINE 2% (20 MG/ML) 5 ML SYRINGE
INTRAMUSCULAR | Status: AC
  Filled 2022-05-16: qty 5

## 2022-05-16 MED ORDER — DEXAMETHASONE SODIUM PHOSPHATE 10 MG/ML IJ SOLN
INTRAMUSCULAR | Status: DC | PRN
  Administered 2022-05-16: 8 mg via INTRAVENOUS

## 2022-05-16 MED ORDER — BUPIVACAINE-EPINEPHRINE 0.5% -1:200000 IJ SOLN
INTRAMUSCULAR | Status: DC | PRN
  Administered 2022-05-16: 20 mL

## 2022-05-16 MED ORDER — BUPIVACAINE-EPINEPHRINE (PF) 0.5% -1:200000 IJ SOLN
INTRAMUSCULAR | Status: AC
  Filled 2022-05-16: qty 30

## 2022-05-16 MED ORDER — MEPERIDINE HCL 25 MG/ML IJ SOLN: 6.2500 mg | INTRAMUSCULAR | Status: DC | PRN

## 2022-05-16 MED ORDER — HYDROMORPHONE HCL 1 MG/ML IJ SOLN
INTRAMUSCULAR | Status: AC
  Administered 2022-05-16: 1 mg via INTRAVENOUS
  Filled 2022-05-16: qty 1

## 2022-05-16 MED ORDER — PROPOFOL 10 MG/ML IV BOLUS
INTRAVENOUS | Status: AC
  Filled 2022-05-16: qty 20

## 2022-05-16 MED ORDER — HYDROMORPHONE HCL 1 MG/ML IJ SOLN
0.2500 mg | INTRAMUSCULAR | Status: DC | PRN
  Administered 2022-05-16 (×2): 0.5 mg via INTRAVENOUS

## 2022-05-16 MED ORDER — LACTATED RINGERS IV SOLN: INTRAVENOUS | Status: DC | PRN

## 2022-05-16 MED ORDER — SODIUM CHLORIDE 0.9 % IV SOLN
INTRAVENOUS | Status: DC | PRN
  Administered 2022-05-16: 2 mL

## 2022-05-16 MED ORDER — OXYCODONE HCL 5 MG PO TABS: 5.0000 mg | ORAL_TABLET | ORAL | Status: DC | PRN

## 2022-05-16 MED ORDER — ONDANSETRON HCL 4 MG/2ML IJ SOLN
INTRAMUSCULAR | Status: DC | PRN
  Administered 2022-05-16: 4 mg via INTRAVENOUS

## 2022-05-16 MED ORDER — DEXAMETHASONE SODIUM PHOSPHATE 10 MG/ML IJ SOLN
INTRAMUSCULAR | Status: AC
  Filled 2022-05-16: qty 1

## 2022-05-16 MED ORDER — SUGAMMADEX SODIUM 200 MG/2ML IV SOLN
INTRAVENOUS | Status: DC | PRN
  Administered 2022-05-16: 200 mg via INTRAVENOUS

## 2022-05-16 MED ORDER — OXYCODONE HCL 5 MG/5ML PO SOLN: 5.0000 mg | Freq: Once | ORAL | Status: DC | PRN

## 2022-05-16 MED ORDER — MIDAZOLAM HCL 2 MG/2ML IJ SOLN: 0.5000 mg | Freq: Once | INTRAMUSCULAR | Status: DC | PRN

## 2022-05-16 MED ORDER — ACETAMINOPHEN 325 MG PO TABS: 650.0000 mg | ORAL_TABLET | Freq: Four times a day (QID) | ORAL | Status: DC | PRN

## 2022-05-16 MED ORDER — ONDANSETRON HCL 4 MG/2ML IJ SOLN
INTRAMUSCULAR | Status: AC
  Filled 2022-05-16: qty 2

## 2022-05-16 MED ORDER — CHLORHEXIDINE GLUCONATE 0.12 % MT SOLN
OROMUCOSAL | Status: AC
  Filled 2022-05-16: qty 15

## 2022-05-16 MED ORDER — LIDOCAINE 2% (20 MG/ML) 5 ML SYRINGE
INTRAMUSCULAR | Status: DC | PRN
  Administered 2022-05-16: 20 mg via INTRAVENOUS

## 2022-05-16 MED ORDER — PROMETHAZINE HCL 25 MG/ML IJ SOLN
INTRAMUSCULAR | Status: AC
  Filled 2022-05-16: qty 1

## 2022-05-16 MED ORDER — ACETAMINOPHEN 10 MG/ML IV SOLN
INTRAVENOUS | Status: AC
  Filled 2022-05-16: qty 100

## 2022-05-16 MED ORDER — ROCURONIUM BROMIDE 10 MG/ML (PF) SYRINGE
PREFILLED_SYRINGE | INTRAVENOUS | Status: DC | PRN
  Administered 2022-05-16: 60 mg via INTRAVENOUS
  Administered 2022-05-16 (×2): 20 mg via INTRAVENOUS

## 2022-05-16 MED ORDER — HYDROMORPHONE HCL 1 MG/ML IJ SOLN
INTRAMUSCULAR | Status: AC
  Filled 2022-05-16: qty 1

## 2022-05-16 MED ORDER — 0.9 % SODIUM CHLORIDE (POUR BTL) OPTIME
TOPICAL | Status: DC | PRN
  Administered 2022-05-16: 1000 mL

## 2022-05-16 MED ORDER — PROPOFOL 10 MG/ML IV BOLUS
INTRAVENOUS | Status: DC | PRN
  Administered 2022-05-16: 120 mg via INTRAVENOUS

## 2022-05-16 MED ORDER — MIDAZOLAM HCL 2 MG/2ML IJ SOLN
INTRAMUSCULAR | Status: AC
  Filled 2022-05-16: qty 2

## 2022-05-16 SURGICAL SUPPLY — 35 items
APPLIER CLIP 5 13 M/L LIGAMAX5 (MISCELLANEOUS) ×1
BAG COUNTER SPONGE SURGICOUNT (BAG) ×1 IMPLANT
BLADE CLIPPER SURG (BLADE) IMPLANT
CANISTER SUCT 3000ML PPV (MISCELLANEOUS) ×1 IMPLANT
CATH REDDICK CHOLANGI 4FR 50CM (CATHETERS) ×1 IMPLANT
CHLORAPREP W/TINT 26 (MISCELLANEOUS) ×1 IMPLANT
CLIP APPLIE 5 13 M/L LIGAMAX5 (MISCELLANEOUS) ×1 IMPLANT
COVER MAYO STAND STRL (DRAPES) ×1 IMPLANT
COVER SURGICAL LIGHT HANDLE (MISCELLANEOUS) ×1 IMPLANT
DERMABOND ADVANCED .7 DNX12 (GAUZE/BANDAGES/DRESSINGS) ×1 IMPLANT
DRAPE C-ARM 42X120 X-RAY (DRAPES) ×1 IMPLANT
ELECT REM PT RETURN 9FT ADLT (ELECTROSURGICAL) ×1
ELECTRODE REM PT RTRN 9FT ADLT (ELECTROSURGICAL) ×1 IMPLANT
GLOVE BIO SURGEON STRL SZ7.5 (GLOVE) ×1 IMPLANT
GOWN STRL REUS W/ TWL LRG LVL3 (GOWN DISPOSABLE) ×3 IMPLANT
GOWN STRL REUS W/TWL LRG LVL3 (GOWN DISPOSABLE) ×3
IV CATH 14GX2 1/4 (CATHETERS) ×1 IMPLANT
KIT BASIN OR (CUSTOM PROCEDURE TRAY) ×1 IMPLANT
KIT TURNOVER KIT B (KITS) ×1 IMPLANT
NS IRRIG 1000ML POUR BTL (IV SOLUTION) ×1 IMPLANT
PAD ARMBOARD 7.5X6 YLW CONV (MISCELLANEOUS) ×1 IMPLANT
SCISSORS LAP 5X35 DISP (ENDOMECHANICALS) ×1 IMPLANT
SET IRRIG TUBING LAPAROSCOPIC (IRRIGATION / IRRIGATOR) ×1 IMPLANT
SET TUBE SMOKE EVAC HIGH FLOW (TUBING) ×1 IMPLANT
SLEEVE Z-THREAD 5X100MM (TROCAR) ×2 IMPLANT
SPECIMEN JAR SMALL (MISCELLANEOUS) ×1 IMPLANT
SUT MNCRL AB 4-0 PS2 18 (SUTURE) ×1 IMPLANT
SYS BAG RETRIEVAL 10MM (BASKET) ×1
SYSTEM BAG RETRIEVAL 10MM (BASKET) ×1 IMPLANT
TOWEL GREEN STERILE (TOWEL DISPOSABLE) ×1 IMPLANT
TOWEL GREEN STERILE FF (TOWEL DISPOSABLE) ×1 IMPLANT
TRAY LAPAROSCOPIC MC (CUSTOM PROCEDURE TRAY) ×1 IMPLANT
TROCAR BALLN 12MMX100 BLUNT (TROCAR) ×1 IMPLANT
TROCAR Z-THREAD OPTICAL 5X100M (TROCAR) ×1 IMPLANT
WATER STERILE IRR 1000ML POUR (IV SOLUTION) ×1 IMPLANT

## 2022-05-16 NOTE — Progress Notes (Signed)
Pt refusing cpap for the night.

## 2022-05-16 NOTE — H&P (View-Only) (Signed)
[facility] Progress Note     Subjective: States abdominal is much improved and is only taking the IV pain meds due to worry about it coming back. Eating ice without pain. No n/v. ambulating  Objective: Vital signs in last 24 hours: Temp:  [97.7 F (36.5 C)-98.3 F (36.8 C)] 98.3 F (36.8 C) (01/30 0742) Pulse Rate:  [65-80] 75 (01/30 0742) Resp:  [18-19] 18 (01/30 0742) BP: (146-190)/(90-101) 164/94 (01/30 0742) SpO2:  [93 %-99 %] 93 % (01/30 0742)    Intake/Output from previous day: 01/29 0701 - 01/30 0700 In: 797.7 [IV Piggyback:797.7] Out: -  Intake/Output this shift: No intake/output data recorded.  PE: Gen:  Alert, NAD, pleasant Abd: soft, ND, no epigastric TTP   Lab Results:  Recent Labs    05/15/22 0806 05/16/22 0712  WBC 6.4 6.6  HGB 14.9 15.0  HCT 46.2 43.6  PLT 130* 145*    BMET Recent Labs    05/15/22 0806 05/16/22 0712  NA 135 135  K 3.9 3.6  CL 99 102  CO2 19* 22  GLUCOSE 90 94  BUN 13 18  CREATININE 1.31* 1.19  CALCIUM 9.0 9.0    PT/INR Recent Labs    05/13/22 2102  LABPROT 15.6*  INR 1.3*    CMP     Component Value Date/Time   NA 135 05/16/2022 0712   NA 140 04/24/2022 0931   K 3.6 05/16/2022 0712   CL 102 05/16/2022 0712   CO2 22 05/16/2022 0712   GLUCOSE 94 05/16/2022 0712   BUN 18 05/16/2022 0712   BUN 23 04/24/2022 0931   CREATININE 1.19 05/16/2022 0712   CALCIUM 9.0 05/16/2022 0712   PROT 5.8 (L) 05/16/2022 0712   PROT 7.1 04/24/2022 0931   ALBUMIN 2.8 (L) 05/16/2022 0712   ALBUMIN 4.5 04/24/2022 0931   AST 62 (H) 05/16/2022 0712   ALT 90 (H) 05/16/2022 0712   ALKPHOS 290 (H) 05/16/2022 0712   BILITOT 3.4 (H) 05/16/2022 0712   BILITOT 0.7 04/24/2022 0931   GFRNONAA >60 05/16/2022 0712   GFRAA 69 03/26/2020 1154   Lipase     Component Value Date/Time   LIPASE 79 (H) 05/16/2022 0712       Studies/Results: No results found.  Anti-infectives: Anti-infectives (From admission, onward)     Start     Dose/Rate Route Frequency Ordered Stop   05/13/22 2230  metroNIDAZOLE (FLAGYL) IVPB 500 mg        500 mg 100 mL/hr over 60 Minutes Intravenous Every 12 hours 05/13/22 2218     05/13/22 2230  ciprofloxacin (CIPRO) IVPB 400 mg        400 mg 200 mL/hr over 60 Minutes Intravenous Every 12 hours 05/13/22 2229          Assessment/Plan Gallstone pancreatitis Elevated LFTs, hyperbilirubinemia  - MRCP negative for choledocholithiasis, no intrahepatic or extrahepatic ductal dilatation - LFTs remain elevated but down trending. T bili now 3.4 (4.2) - lipase down from admission - taking a lot of IV pain medications but pain is actually improving - continue NPO for now as may proceed OR today. Otherwise can have CLD and NPO MN for possible OR tomorrow  I have explained the procedure, risks, and aftercare of cholecystectomy.  Risks include but are not limited to bleeding, infection, wound problems, anesthesia, diarrhea, bile leak, injury to common bile duct/liver/intestine.  He seems to understand and agrees to proceed.   ID - cipro/flagyl FEN - IVF, NPO VTE -  SCDs, ok for chemical dvt ppx from surgical standpoint Foley - none  Hx Ascending aortic aneurysm s/p repair  HTN HLD GERD OSA CKD-III Thrombocytopenia  I reviewed hospitalist notes, last 24 h vitals and pain scores, last 48 h intake and output, last 24 h labs and trends, and last 24 h imaging results.    LOS: 3 days    [facility] 05/16/2022, 8:36 AM Please see Amion for pager number during day hours 7:00am-4:30pm

## 2022-05-16 NOTE — Progress Notes (Signed)
PROGRESS NOTE    Clayton Hernandez  AOZ:[id number] DOB: 06/09/1970 DOA: 05/13/2022 PCP: [name], MD   Brief Narrative:  The patient is a 52 year old overweight Caucasian male with a past medical history significant for but not limited to stage IIIa chronic kidney disease, NAFLD, hypertension, GERD, history of colon cancer, hyperlipidemia, OSA, ascending aortic aneurysm as well as other comorbidities who presented to the ED with complaint of abdominal pain.  Came to the ED yesterday and was diagnosed with gallstones and had outpatient follow-up with surgery but he worsened and had acute pain started around 5 PM and so he presented to the ED.  He had associated nausea, but no vomiting.  Pain was 10 out of 10 in severity with intensity coming on but he states that it is a constant pain.  The patient woke up yesterday morning and his urine was brown and he had 5 movements that were hard and discolored and he had noticed some blood in his urine and continued to have pain that caused him to double over so EMS was called.  He was admitted for further evaluation and a CT scan of the abdomen pelvis was done which showed mild cholelithiasis and gallbladder distention which was unchanged from the day before.  She did not biliary distention as well.  General surgery was consulted and given IV fluids and MRCP was done.  General surgery planning on cholecystectomy once his gallstone pancreatitis improves.  General surgery feels that this is likely be complicated surgery given the extent of his previous abdominal surgeries and recommending conservative management at this time with bowel rest and fluid hydration.   Continues to use IV narcotics but pain is improving.  Patient asking for diet and remains hungry.  LFTs and bilirubin is trending down.  Surgery evaluated and recommending continuing n.p.o. for now and they were taking the patient to the OR for surgical intervention later today.  Will continue  antibiotics with IV Cipro and Flagyl for now   Assessment and Plan: * Cholelithiasis with concern for Choledocholithiasis Acute Pancreatitis  -52 year old presenting with acute onset of abdominal pain, jaundice, nausea found to have cholelithiasis with high suspicion for choledocholithiasis  -Admit to med-surg -General surgery consulted -CT shows no cholecystitis, but mild cholelithiasis and gallbladder distention. Biliary dilation noted. Concern for stone in bile duct with his jaundice and t.bili to 8.7. lipase at 661.  -MRCP done and showed "Motion degraded images. Cholelithiasis, with mild gallbladder distension and mild gallbladder wall thickening. No definite pericholecystic inflammatory changes to suggest early acute cholecystitis. No intrahepatic or extrahepatic ductal dilatation. Common duct measures 5 mm. No choledocholithiasis is seen. -if +, will need GI consult for ERCP -continue NPO except for ice chips and meds -IVF with NS -pain medication with dilaudid as he is in significant pain  -fever to 100.3 on arrival and diaphoretic, start cipro and flagyl to cover and can de escalate if cholecystitis ruled out. Anaphylaxis allergy to PCN  -General Surgery evaluated and now recommending n.p.o. still and will take the patient for surgical intervention this afternoon for laparoscopic cholecystectomy   Erythrocytosis -States he has had this "forever" Never seen a hematologist for his polycythemia or thrombocytopenia.  ? Secondary to OSA vs. Pulmonary disease  -Hgb/Hct Trend: Recent Labs  Lab 04/24/22 0931 05/12/22 0040 05/12/22 0040 05/12/22 0122 05/13/22 1439 05/14/22 0218 05/15/22 0806 05/16/22 0712  HGB 18.0* 15.7  --  16.0 17.3* 15.6 14.9 15.0  HCT 56.5* 48.8   < >  47.0 52.1* 47.0 46.2 43.6  MCV 83 84.6   < >  --  83.8 84.4 84.3 81.2   < > = values in this interval not displayed.  -Recommend f/u with heme/onc outpatient    Ascending aortic aneurysm s/p repair -Done at  [facility] today shows 4.0>4.2 -Recommend yearly surveillance    Essential hypertension -Stable  -C/w irbesartan 150 mg p.o. daily, metoprolol succinate 50 mL p.o. daily -Continue to monitor blood pressures per protocol -Last BP reading was 145/92   GERD (gastroesophageal reflux disease) -Continue PPI with Pantoprazole but changed to IV    Dyslipidemia -Continue to Hold statin with elevated liver enzymes    OSA (obstructive sleep apnea) -Continue CPAP   CKD (chronic kidney disease) stage 3, GFR 30-59 ml/min (HCC) Metabolic Acidosis  -BUN/Cr Trend: Recent Labs  Lab 04/24/22 0931 05/12/22 0040 05/12/22 0122 05/13/22 1439 05/14/22 0218 05/15/22 0806 05/16/22 0712  BUN 23 19 24* '14 12 13 18  '$ CREATININE 1.42* 1.50* 1.40* 1.33* 1.44* 1.31* 1.19  -Avoid Nephrotoxic Medications, Contrast Dyes, Hypotension and Dehydration to Ensure Adequate Renal Perfusion and will need to Renally Adjust Meds -Patient has a Metabolic Acidosis with a CO2 with 20, AG of 11, and Chloride Level of 105 -Continue to Monitor and Trend Renal Function carefully and repeat CMP in the AM    Thrombocytopenia -Platelet Count Trend: Recent Labs  Lab 04/24/22 0931 05/12/22 0040 05/13/22 1439 05/14/22 0218 05/15/22 0806 05/16/22 0712  PLT 132* 132* 142* 123* 130* 145*  -States that this is Chronic -Will check B12/smear and HIV -Does have NAFLD and could be secondary to this  -Does not drink alcohol  -Recommend outpatient heme f/u    Hyperbilirbuinemia Abnormal LFTs -LFT and Bilirubin Trend: Recent Labs  Lab 04/24/22 0931 04/24/22 0931 05/12/22 0040 05/13/22 1439 05/14/22 0218 05/15/22 0806 05/16/22 0712  AST 33  --  223* 343* 241* 99* 62*  ALT 24  --  104* 246* 196* 127* 90*  BILITOT 0.7   < > 2.4* 8.7* 8.1* 4.2* 3.4*   < > = values in this interval not displayed.  -Continue to monitor and trend hepatic function panel carefully and he had an MRCP as above -Repeat CMP in the a.m.    Hypoalbuminemia -Patient's Albumin Level Trend Recent Labs  Lab 04/24/22 0931 05/12/22 0040 05/13/22 1439 05/14/22 0218 05/15/22 0806 05/16/22 0712  ALBUMIN 4.5 3.5 3.6 3.0* 2.9* 2.8*  -Continue to Monitor and Trend and repeat CMP in the AM   Overweight -Complicates overall prognosis and care -Estimated body mass index is 28.87 kg/m as calculated from the following:   Height as of this encounter: '6\' 2"'$  (1.88 m).   Weight as of this encounter: 102 kg.  -Weight Loss and Dietary Counseling given  DVT prophylaxis: SCDs Start: 05/13/22 2108    Code Status: Full Code Family Communication: No family currently at bedside  Disposition Plan:  Level of care: Med-Surg Status is: Inpatient Remains inpatient appropriate because: Going to The OR later this afternoon   Consultants:  General Surgery   Procedures:  As delineated as above   Antimicrobials:  Anti-infectives (From admission, onward)    Start     Dose/Rate Route Frequency Ordered Stop   05/13/22 2230  metroNIDAZOLE (FLAGYL) IVPB 500 mg        500 mg 100 mL/hr over 60 Minutes Intravenous Every 12 hours 05/13/22 2218     05/13/22 2230  ciprofloxacin (CIPRO) IVPB 400 mg  400 mg 200 mL/hr over 60 Minutes Intravenous Every 12 hours 05/13/22 2229         Subjective: Seen and examined at bedside and he still has some abdominal discomfort.  No nausea or vomiting.  Asking to eat.  No other concerns or complaints at this time.  Objective: Vitals:   05/15/22 1735 05/15/22 1949 05/16/22 0408 05/16/22 0742  BP: (!) 161/90 (!) 158/99 (!) 146/96 (!) 164/94  Pulse: 78 71 80 75  Resp:  '18 18 18  '$ Temp:  98.2 F (36.8 C) 98.1 F (36.7 C) 98.3 F (36.8 C)  TempSrc:  Oral Oral Oral  SpO2:  99% 96% 93%  Weight:      Height:        Intake/Output Summary (Last 24 hours) at 05/16/2022 0818 Last data filed at 05/16/2022 1287 Gross per 24 hour  Intake 797.65 ml  Output --  Net 797.65 ml   Filed Weights   05/13/22  1422  Weight: 102 kg   Examination: Physical Exam:  Constitutional: WN/WD overweight Caucasian male in NAD  Respiratory: Diminished to auscultation bilaterally, no wheezing, rales, rhonchi or crackles. Normal respiratory effort and patient is not tachypenic. No accessory muscle use. Unlabored breathing   Cardiovascular: RRR, no murmurs / rubs / gallops. S1 and S2 auscultated. No extremity edema.  Abdomen: Soft, Tender to palpate, Distended 2/2 body habitus. Bowel sounds positive.  GU: Deferred. Musculoskeletal: No clubbing / cyanosis of digits/nails. No joint deformity upper and lower extremities.  Skin: No rashes, lesions, ulcers on a limited skin evaluation. No induration; Warm and dry.  Neurologic: CN 2-12 grossly intact with no focal deficits. Romberg sign and cerebellar reflexes not assessed.  Psychiatric: Normal judgment and insight. Alert and oriented x 3. Normal mood and appropriate affect.   Data Reviewed: I have personally reviewed following labs and imaging studies  CBC: Recent Labs  Lab 05/12/22 0040 05/12/22 0122 05/13/22 1439 05/14/22 0218 05/15/22 0806 05/16/22 0712  WBC 8.9  --  7.9 6.1 6.4 6.6  NEUTROABS 7.2  --   --   --   --  4.8  HGB 15.7 16.0 17.3* 15.6 14.9 15.0  HCT 48.8 47.0 52.1* 47.0 46.2 43.6  MCV 84.6  --  83.8 84.4 84.3 81.2  PLT 132*  --  142* 123* 130* 867*   Basic Metabolic Panel: Recent Labs  Lab 05/12/22 0040 05/12/22 0122 05/13/22 1439 05/14/22 0218 05/15/22 0806 05/16/22 0712  NA 138 141 136 136 135 135  K 3.9 4.0 3.8 3.9 3.9 3.6  CL 106 107 104 105 99 102  CO2 22  --  22 20* 19* 22  GLUCOSE 186* 156* 106* 112* 90 94  BUN 19 24* '14 12 13 18  '$ CREATININE 1.50* 1.40* 1.33* 1.44* 1.31* 1.19  CALCIUM 8.6*  --  8.7* 8.3* 9.0 9.0  MG  --   --   --   --   --  1.8  PHOS  --   --   --   --   --  3.3   GFR: Estimated Creatinine Clearance: 91.5 mL/min (by C-G formula based on SCr of 1.19 mg/dL). Liver Function Tests: Recent Labs  Lab  05/12/22 0040 05/13/22 1439 05/14/22 0218 05/15/22 0806 05/16/22 0712  AST 223* 343* 241* 99* 62*  ALT 104* 246* 196* 127* 90*  ALKPHOS 186* 336* 286* 304* 290*  BILITOT 2.4* 8.7* 8.1* 4.2* 3.4*  PROT 6.1* 6.4* 6.0* 5.8* 5.8*  ALBUMIN 3.5 3.6 3.0* 2.9*  2.8*   Recent Labs  Lab 05/12/22 0040 05/13/22 1439 05/14/22 0218 05/15/22 0812 05/16/22 0712  LIPASE 46 661* 380* 71* 79*   No results for input(s): "AMMONIA" in the last 168 hours. Coagulation Profile: Recent Labs  Lab 05/13/22 2102  INR 1.3*   Cardiac Enzymes: No results for input(s): "CKTOTAL", "CKMB", "CKMBINDEX", "TROPONINI" in the last 168 hours. BNP (last 3 results) No results for input(s): "PROBNP" in the last 8760 hours. HbA1C: No results for input(s): "HGBA1C" in the last 72 hours. CBG: No results for input(s): "GLUCAP" in the last 168 hours. Lipid Profile: No results for input(s): "CHOL", "HDL", "LDLCALC", "TRIG", "CHOLHDL", "LDLDIRECT" in the last 72 hours. Thyroid Function Tests: No results for input(s): "TSH", "T4TOTAL", "FREET4", "T3FREE", "THYROIDAB" in the last 72 hours. Anemia Panel: No results for input(s): "VITAMINB12", "FOLATE", "FERRITIN", "TIBC", "IRON", "RETICCTPCT" in the last 72 hours. Sepsis Labs: No results for input(s): "PROCALCITON", "LATICACIDVEN" in the last 168 hours.  No results found for this or any previous visit (from the past 240 hour(s)).   Radiology Studies: No results found.  Scheduled Meds:  irbesartan  150 mg Oral Daily   metoprolol succinate  50 mg Oral Daily   pantoprazole (PROTONIX) IV  40 mg Intravenous Q24H   Continuous Infusions:  ciprofloxacin Stopped (05/15/22 2342)   metronidazole 500 mg (05/15/22 2245)    LOS: 3 days   Raiford Noble, DO [facility] Available via Epic secure chat 7am-7pm After these hours, please refer to coverage provider listed on [email or web address] 05/16/2022, 8:18 AM

## 2022-05-16 NOTE — Anesthesia Preprocedure Evaluation (Addendum)
Anesthesia Evaluation  Patient identified by MRN, date of birth, ID band Patient awake    Reviewed: Allergy & Precautions, NPO status , Patient's Chart, lab work & pertinent test results  History of Anesthesia Complications Negative for: history of anesthetic complications  Airway Mallampati: II  TM Distance: >3 FB Neck ROM: Full    Dental  (+) Dental Advisory Given   Pulmonary asthma , sleep apnea and Continuous Positive Airway Pressure Ventilation , COPD,  COPD inhaler, former smoker   breath sounds clear to auscultation       Cardiovascular hypertension, Pt. on medications and Pt. on home beta blockers (-) angina + Peripheral Vascular Disease (s/p aortic root replacement 2 years ago)   Rhythm:Regular Rate:Normal  '21 cath:  The left ventricular systolic function is normal. LV end diastolic pressure is normal. The left ventricular ejection fraction is 55-65% by visual estimate. There is no aortic valve stenosis. No angiographically apparent CAD. Dilated thoracic aorta without dissection.    Neuro/Psych  Headaches    GI/Hepatic ,GERD  Medicated and Controlled,,(+) Cirrhosis       Elevated LFTs Gallstone pancreatitis H/o colon cancer   Endo/Other  negative endocrine ROS    Renal/GU Renal InsufficiencyRenal disease     Musculoskeletal  (+) Arthritis ,    Abdominal   Peds  Hematology   Anesthesia Other Findings   Reproductive/Obstetrics                             Anesthesia Physical Anesthesia Plan  ASA: 3  Anesthesia Plan: General   Post-op Pain Management: Ofirmev IV (intra-op)*   Induction: Intravenous  PONV Risk Score and Plan: 2 and Ondansetron and Dexamethasone  Airway Management Planned: Oral ETT  Additional Equipment: None  Intra-op Plan:   Post-operative Plan: Extubation in OR  Informed Consent: I have reviewed the patients History and Physical, chart, labs  and discussed the procedure including the risks, benefits and alternatives for the proposed anesthesia with the patient or authorized representative who has indicated his/her understanding and acceptance.     Dental advisory given  Plan Discussed with: CRNA and Surgeon  Anesthesia Plan Comments:        Anesthesia Quick Evaluation

## 2022-05-16 NOTE — Transfer of Care (Signed)
Immediate Anesthesia Transfer of Care Note  Patient: Clayton Hernandez  Procedure(s) Performed: LAPAROSCOPIC CHOLECYSTECTOMY WITH INTRAOPERATIVE CHOLANGIOGRAM  Patient Location: PACU  Anesthesia Type:General  Level of Consciousness: drowsy and patient cooperative  Airway & Oxygen Therapy: Patient Spontanous Breathing and Patient connected to nasal cannula oxygen  Post-op Assessment: Report given to RN  Post vital signs: Reviewed and stable  Last Vitals:  Vitals Value Taken Time  BP 153/87 05/16/22 1550  Temp 37.2 C 05/16/22 1550  Pulse 81 05/16/22 1552  Resp 18 05/16/22 1552  SpO2 91 % 05/16/22 1552  Vitals shown include unvalidated device data.  Last Pain:  Vitals:   05/16/22 1255  TempSrc:   PainSc: 2       Patients Stated Pain Goal: 0 (70/96/28 3662)  Complications: No notable events documented.

## 2022-05-16 NOTE — Plan of Care (Signed)

## 2022-05-16 NOTE — Anesthesia Procedure Notes (Signed)
Procedure Name: Intubation Date/Time: 05/16/2022 2:06 PM  Performed by: Barrington Ellison, CRNAPre-anesthesia Checklist: Patient identified, Emergency Drugs available, Suction available and Patient being monitored Patient Re-evaluated:Patient Re-evaluated prior to induction Oxygen Delivery Method: Circle System Utilized Preoxygenation: Pre-oxygenation with 100% oxygen Induction Type: IV induction Ventilation: Mask ventilation without difficulty Laryngoscope Size: Mac and 4 Grade View: Grade I Tube type: Oral Tube size: 7.5 mm Number of attempts: 1 Airway Equipment and Method: Stylet and Oral airway Placement Confirmation: ETT inserted through vocal cords under direct vision, positive ETCO2 and breath sounds checked- equal and bilateral Secured at: 22 cm Tube secured with: Tape Dental Injury: Teeth and Oropharynx as per pre-operative assessment

## 2022-05-16 NOTE — Interval H&P Note (Signed)
History and Physical Interval Note:  05/16/2022 1:52 PM  Clayton Hernandez  has presented today for surgery, with the diagnosis of Gallstone Pancreatitis.  The various methods of treatment have been discussed with the patient and family. After consideration of risks, benefits and other options for treatment, the patient has consented to  Procedure(s): LAPAROSCOPIC CHOLECYSTECTOMY WITH INTRAOPERATIVE CHOLANGIOGRAM (N/A) as a surgical intervention.  The patient's history has been reviewed, patient examined, no change in status, stable for surgery.  I have reviewed the patient's chart and labs.  Questions were answered to the patient's satisfaction.     [name]

## 2022-05-16 NOTE — Progress Notes (Addendum)
[facility] Progress Note     Subjective: States abdominal is much improved and is only taking the IV pain meds due to worry about it coming back. Eating ice without pain. No n/v. ambulating  Objective: Vital signs in last 24 hours: Temp:  [97.7 F (36.5 C)-98.3 F (36.8 C)] 98.3 F (36.8 C) (01/30 0742) Pulse Rate:  [65-80] 75 (01/30 0742) Resp:  [18-19] 18 (01/30 0742) BP: (146-190)/(90-101) 164/94 (01/30 0742) SpO2:  [93 %-99 %] 93 % (01/30 0742)    Intake/Output from previous day: 01/29 0701 - 01/30 0700 In: 797.7 [IV Piggyback:797.7] Out: -  Intake/Output this shift: No intake/output data recorded.  PE: Gen:  Alert, NAD, pleasant Abd: soft, ND, no epigastric TTP   Lab Results:  Recent Labs    05/15/22 0806 05/16/22 0712  WBC 6.4 6.6  HGB 14.9 15.0  HCT 46.2 43.6  PLT 130* 145*    BMET Recent Labs    05/15/22 0806 05/16/22 0712  NA 135 135  K 3.9 3.6  CL 99 102  CO2 19* 22  GLUCOSE 90 94  BUN 13 18  CREATININE 1.31* 1.19  CALCIUM 9.0 9.0    PT/INR Recent Labs    05/13/22 2102  LABPROT 15.6*  INR 1.3*    CMP     Component Value Date/Time   NA 135 05/16/2022 0712   NA 140 04/24/2022 0931   K 3.6 05/16/2022 0712   CL 102 05/16/2022 0712   CO2 22 05/16/2022 0712   GLUCOSE 94 05/16/2022 0712   BUN 18 05/16/2022 0712   BUN 23 04/24/2022 0931   CREATININE 1.19 05/16/2022 0712   CALCIUM 9.0 05/16/2022 0712   PROT 5.8 (L) 05/16/2022 0712   PROT 7.1 04/24/2022 0931   ALBUMIN 2.8 (L) 05/16/2022 0712   ALBUMIN 4.5 04/24/2022 0931   AST 62 (H) 05/16/2022 0712   ALT 90 (H) 05/16/2022 0712   ALKPHOS 290 (H) 05/16/2022 0712   BILITOT 3.4 (H) 05/16/2022 0712   BILITOT 0.7 04/24/2022 0931   GFRNONAA >60 05/16/2022 0712   GFRAA 69 03/26/2020 1154   Lipase     Component Value Date/Time   LIPASE 79 (H) 05/16/2022 0712       Studies/Results: No results found.  Anti-infectives: Anti-infectives (From admission, onward)     Start     Dose/Rate Route Frequency Ordered Stop   05/13/22 2230  metroNIDAZOLE (FLAGYL) IVPB 500 mg        500 mg 100 mL/hr over 60 Minutes Intravenous Every 12 hours 05/13/22 2218     05/13/22 2230  ciprofloxacin (CIPRO) IVPB 400 mg        400 mg 200 mL/hr over 60 Minutes Intravenous Every 12 hours 05/13/22 2229          Assessment/Plan Gallstone pancreatitis Elevated LFTs, hyperbilirubinemia  - MRCP negative for choledocholithiasis, no intrahepatic or extrahepatic ductal dilatation - LFTs remain elevated but down trending. T bili now 3.4 (4.2) - lipase down from admission - taking a lot of IV pain medications but pain is actually improving - continue NPO for now as may proceed OR today. Otherwise can have CLD and NPO MN for possible OR tomorrow  I have explained the procedure, risks, and aftercare of cholecystectomy.  Risks include but are not limited to bleeding, infection, wound problems, anesthesia, diarrhea, bile leak, injury to common bile duct/liver/intestine.  He seems to understand and agrees to proceed.   ID - cipro/flagyl FEN - IVF, NPO VTE -  SCDs, ok for chemical dvt ppx from surgical standpoint Foley - none  Hx Ascending aortic aneurysm s/p repair  HTN HLD GERD OSA CKD-III Thrombocytopenia  I reviewed hospitalist notes, last 24 h vitals and pain scores, last 48 h intake and output, last 24 h labs and trends, and last 24 h imaging results.    LOS: 3 days    [facility] 05/16/2022, 8:36 AM Please see Amion for pager number during day hours 7:00am-4:30pm

## 2022-05-16 NOTE — Progress Notes (Signed)
PT Cancellation Note  Patient Details Name: Clayton Hernandez MRN: [id number] DOB: 1970/11/01   Cancelled Treatment:    Reason Eval/Treat Not Completed: PT screened, no needs identified, will sign off.  Thanks.   Alvira Philips 05/16/2022, 9:46 AM Cybele Maule M,PT Acute Rehab Services [phone]

## 2022-05-16 NOTE — Op Note (Signed)
05/13/2022 - 05/16/2022  3:33 PM  PATIENT:  Clayton Hernandez  52 y.o. male  PRE-OPERATIVE DIAGNOSIS:  Gallstone Pancreatitis  POST-OPERATIVE DIAGNOSIS:  Gallstone Pancreatitis  PROCEDURE:  Procedure(s): LAPAROSCOPIC CHOLECYSTECTOMY WITH INTRAOPERATIVE CHOLANGIOGRAM (N/A)  SURGEON:  Surgeon(s) and Role:    * Jovita Kussmaul, MD - Primary  PHYSICIAN ASSISTANT:   ASSISTANTS: Saverio Danker, PA   ANESTHESIA:   local and general  EBL:  20cc   BLOOD ADMINISTERED:none  DRAINS: none   LOCAL MEDICATIONS USED:  MARCAINE     SPECIMEN:  Source of Specimen:  gallbladder  DISPOSITION OF SPECIMEN:  PATHOLOGY  COUNTS:  YES  TOURNIQUET:  * No tourniquets in log *  DICTATION: .Dragon Dictation    Procedure: After informed consent was obtained the patient was brought to the operating room and placed in the supine position on the operating room table. After adequate induction of general anesthesia the patient's abdomen was prepped with ChloraPrep allowed to dry and draped in usual sterile manner. An appropriate timeout was performed. The area below the umbilicus was infiltrated with quarter percent  Marcaine. A small incision was made with a 15 blade knife. The incision was carried down through the subcutaneous tissue bluntly with a hemostat and [employer] retractors. The linea alba was identified. The linea alba was incised with a 15 blade knife and each side was grasped with Coker clamps. The preperitoneal space was then probed with a hemostat until the peritoneum was opened and access was gained to the abdominal cavity. A 0 Vicryl pursestring stitch was placed in the fascia surrounding the opening. A Hassan cannula was then placed through the opening and anchored in place with the previously placed Vicryl purse string stitch. The abdomen was insufflated with carbon dioxide without difficulty. A laparoscope was inserted through the [facility] cannula in the right upper quadrant was inspected. Next  the epigastric region was infiltrated with % Marcaine. A small incision was made with a 15 blade knife. A 5 mm port was placed bluntly through this incision into the abdominal cavity under direct vision. Next 2 sites were chosen laterally on the right side of the abdomen for placement of 5 mm ports. Each of these areas was infiltrated with quarter percent Marcaine. Small stab incisions were made with a 15 blade knife. 5 mm ports were then placed bluntly through these incisions into the abdominal cavity under direct vision without difficulty.  The gallbladder was noted to be significantly distended and inflamed.  The gallbladder was aspirated so that it could be grasped and elevated.  A blunt grasper was placed through the lateralmost 5 mm port and used to grasp the dome of the gallbladder and elevate it anteriorly and superiorly. Another blunt grasper was placed through the other 5 mm port and used to retract the body and neck of the gallbladder. A dissector was placed through the epigastric port and using the electrocautery the peritoneal reflection at the gallbladder neck was opened. Blunt dissection was then carried out in this area until the gallbladder neck-cystic duct junction was readily identified and a good window was created. A single clip was placed on the gallbladder neck. A small  ductotomy was made just below the clip with laparoscopic scissors. A 14-gauge Angiocath was then placed through the anterior abdominal wall under direct vision. A Reddick cholangiogram catheter was then placed through the Angiocath and flushed. The catheter was then placed in the cystic duct and anchored in place with a clip. A cholangiogram was obtained  that showed no filling defects, good emptying into the duodenum, and an adequate length on the cystic duct. The anchoring clip and catheters were then removed from the patient. 3 clips were placed proximally on the cystic duct and the duct was divided between the 2 sets of  clips. Posterior to this the cystic artery was identified and again dissected bluntly in a circumferential manner until a good window  was created. 2 clips were placed proximally and one distally on the artery and the artery was divided between the 2 sets of clips. Next a laparoscopic hook cautery device was used to separate the gallbladder from the liver bed. Prior to completely detaching the gallbladder from the liver bed the liver bed was inspected and several small bleeding points were coagulated with the electrocautery until the area was completely hemostatic. The gallbladder was then detached the rest of it from the liver bed without difficulty.  The gallbladder was noted to be significantly intraparenchymal.  Several stones spilled from the gallbladder but were able to be retrieved.  A laparoscopic bag was inserted through the hassan port. The laparoscope was moved to the epigastric port. The gallbladder was placed within the bag and the bag was sealed.  The bag with the gallbladder was then removed with the [facility] cannula through the infraumbilical port without difficulty. The fascial defect was then closed with the previously placed Vicryl pursestring stitch as well as with another figure-of-eight 0 Vicryl stitch. The liver bed was inspected again and found to be hemostatic. The abdomen was irrigated with copious amounts of saline until the effluent was clear. The ports were then removed under direct vision without difficulty and were found to be hemostatic. The gas was allowed to escape. No other abnormalities were noted on general inspection of the abdomen. The skin incisions were all closed with interrupted 4-0 Monocryl subcuticular stitches. Dermabond dressings were applied. The patient tolerated the procedure well. At the end of the case all needle sponge and instrument counts were correct. The patient was then awakened and taken to recovery in stable condition.  Of note the patient had significant  adhesions of omentum and small bowel to the anterior abdominal wall but this was all below the umbilicus.  The upper abdomen was free of adhesions and scar tissue.  PLAN OF CARE: Admit to inpatient   PATIENT DISPOSITION:  PACU - hemodynamically stable.   Delay start of Pharmacological VTE agent (>24hrs) due to surgical blood loss or risk of bleeding: no

## 2022-05-16 NOTE — Progress Notes (Signed)
OT Cancellation Note  Patient Details Name: Clayton Hernandez MRN: [id number] DOB: January 07, 1971   Cancelled Treatment:    Reason Eval/Treat Not Completed: OT screened, no needs identified, will sign off.  Pt is independent with all ADLS and mobility.  He is actively walking the hallways without any difficulty just reporting increased pain and wanting to know if he will have surgery and if not, when he will be able to eat.  No OT needs at this time.   Daruis Swaim OTR/L 05/16/2022, 9:32 AM

## 2022-05-17 ENCOUNTER — Encounter (HOSPITAL_COMMUNITY): Payer: Self-pay | Admitting: General Surgery

## 2022-05-17 DIAGNOSIS — K8070 Calculus of gallbladder and bile duct without cholecystitis without obstruction: Secondary | ICD-10-CM | POA: Diagnosis not present

## 2022-05-17 LAB — CBC WITH DIFFERENTIAL/PLATELET
Abs Immature Granulocytes: 0.06 10*3/uL (ref 0.00–0.07)
Basophils Absolute: 0 10*3/uL (ref 0.0–0.1)
Basophils Relative: 0 %
Eosinophils Absolute: 0 10*3/uL (ref 0.0–0.5)
Eosinophils Relative: 0 %
HCT: 49.1 % (ref 39.0–52.0)
Hemoglobin: 16.5 g/dL (ref 13.0–17.0)
Immature Granulocytes: 1 %
Lymphocytes Relative: 8 %
Lymphs Abs: 1.1 10*3/uL (ref 0.7–4.0)
MCH: 27.9 pg (ref 26.0–34.0)
MCHC: 33.6 g/dL (ref 30.0–36.0)
MCV: 82.9 fL (ref 80.0–100.0)
Monocytes Absolute: 1.3 10*3/uL — ABNORMAL HIGH (ref 0.1–1.0)
Monocytes Relative: 10 %
Neutro Abs: 10.4 10*3/uL — ABNORMAL HIGH (ref 1.7–7.7)
Neutrophils Relative %: 81 %
Platelets: 190 10*3/uL (ref 150–400)
RBC: 5.92 MIL/uL — ABNORMAL HIGH (ref 4.22–5.81)
RDW: 18.2 % — ABNORMAL HIGH (ref 11.5–15.5)
WBC: 12.8 10*3/uL — ABNORMAL HIGH (ref 4.0–10.5)
nRBC: 0 % (ref 0.0–0.2)

## 2022-05-17 LAB — COMPREHENSIVE METABOLIC PANEL
ALT: 80 U/L — ABNORMAL HIGH (ref 0–44)
AST: 81 U/L — ABNORMAL HIGH (ref 15–41)
Albumin: 3.2 g/dL — ABNORMAL LOW (ref 3.5–5.0)
Alkaline Phosphatase: 276 U/L — ABNORMAL HIGH (ref 38–126)
Anion gap: 8 (ref 5–15)
BUN: 12 mg/dL (ref 6–20)
CO2: 26 mmol/L (ref 22–32)
Calcium: 9 mg/dL (ref 8.9–10.3)
Chloride: 101 mmol/L (ref 98–111)
Creatinine, Ser: 1.32 mg/dL — ABNORMAL HIGH (ref 0.61–1.24)
GFR, Estimated: >60 mL/min (ref >60)
Glucose, Bld: 148 mg/dL — ABNORMAL HIGH (ref 70–99)
Potassium: 4 mmol/L (ref 3.5–5.1)
Sodium: 135 mmol/L (ref 135–145)
Total Bilirubin: 3 mg/dL — ABNORMAL HIGH (ref 0.3–1.2)
Total Protein: 6.7 g/dL (ref 6.5–8.1)

## 2022-05-17 LAB — SURGICAL PATHOLOGY

## 2022-05-17 LAB — MAGNESIUM: Magnesium: 1.6 mg/dL — ABNORMAL LOW (ref 1.7–2.4)

## 2022-05-17 LAB — PHOSPHORUS: Phosphorus: 3.5 mg/dL (ref 2.5–4.6)

## 2022-05-17 MED ORDER — ENOXAPARIN SODIUM 40 MG/0.4ML IJ SOSY
40.0000 mg | PREFILLED_SYRINGE | INTRAMUSCULAR | Status: DC
  Administered 2022-05-17: 40 mg via SUBCUTANEOUS
  Filled 2022-05-17: qty 0.4

## 2022-05-17 MED ORDER — MAGNESIUM SULFATE 2 GM/50ML IV SOLN
2.0000 g | Freq: Once | INTRAVENOUS | Status: AC
  Administered 2022-05-17: 2 g via INTRAVENOUS
  Filled 2022-05-17: qty 50

## 2022-05-17 MED ORDER — OXYCODONE HCL 5 MG PO TABS
5.0000 mg | ORAL_TABLET | ORAL | Status: DC | PRN
  Administered 2022-05-17 – 2022-05-18 (×4): 10 mg via ORAL
  Filled 2022-05-17 (×4): qty 2

## 2022-05-17 MED ORDER — SODIUM CHLORIDE 0.9 % IV SOLN: INTRAVENOUS | Status: DC

## 2022-05-17 MED ORDER — ACETAMINOPHEN 500 MG PO TABS
1000.0000 mg | ORAL_TABLET | Freq: Four times a day (QID) | ORAL | Status: DC
  Administered 2022-05-17 – 2022-05-18 (×4): 1000 mg via ORAL
  Filled 2022-05-17 (×5): qty 2

## 2022-05-17 MED ORDER — METHOCARBAMOL 500 MG PO TABS
500.0000 mg | ORAL_TABLET | Freq: Three times a day (TID) | ORAL | Status: DC
  Administered 2022-05-17 – 2022-05-18 (×4): 500 mg via ORAL
  Filled 2022-05-17 (×4): qty 1

## 2022-05-17 NOTE — Progress Notes (Signed)
PROGRESS NOTE  Clayton Hernandez  ZLD:[id number] DOB: [birth year] DOA: 05/13/2022 PCP: [name], MD   Brief Narrative: Patient is a 52 year old male with history of stage IIa CKD, nonalcoholic fatty liver disease, hypertension, GERD who presented initially with abdominal pain.  He had history of gallstones and was following with outpatient surgery.  CT abdomen/pelvis done on this admission showed cholelithiasis, gallbladder distention.  Lipase was elevated 661, blood work showed elevated liver enzymes .patient was admitted for management of biliary pancreatitis.  General surgery consulted.  Underwent laparoscopic cholecystectomy on 1/30.  Hospital course remarkable for persistent pain.  Plan for possible discharge home tomorrow after general surgery clearance.  Assessment & Plan:  Principal Problem:   Cholelithiasis with concern for choledocholithiasis Active Problems:   Polycythemia   Thrombocytopenia (HCC)   Ascending aortic aneurysm s/p repair   Essential hypertension   CKD (chronic kidney disease) stage 3, GFR 30-59 ml/min (HCC)   GERD (gastroesophageal reflux disease)   Dyslipidemia   OSA (obstructive sleep apnea)   History of colon cancer   Acute biliary pancreatitis: Presented with acute onset of abdominal pain, jaundice, nausea.  Known history of cholelithiasis.  General surgery consulted. Elevated liver enzymes, lipase on presentation.  MRCP showed cholelithiasis, no CBD dilatation. Underwent laparoscopic cholecystectomy on 1/30.  General surgery following. As above mild leukocytosis today, most likely reactive.  Check CBC tomorrow.  Significant improvement in the liver enzymes  Hypertension:  Continue irbesartan, metoprolol.  Monitor blood pressure continue prn medication for severe hypertension  GERD: Continue PPI  Hyperlipidemia: Was on a statin at home, currently on hold due to elevated liver enzymes, will resume on discharge  CKD stage IIIa: Baseline  creatinine is up from 1.2-1.4.  Currently kidney function  at baseline  History of ascending aortic aneurysm: Status postrepair.  Recommend yearly surveillance  History of colon cancer: S/p resection.  Currently in remission  OSA: On CPAP  Hypomagnesemia: Supplemented with potassium  Thrombocytopenia: Chronic,mild .  Most likely associated with  with history of nonalcoholic fatty liver disease.  No history of alcohol use        DVT prophylaxis:enoxaparin (LOVENOX) injection 40 mg Start: 05/17/22 1100 SCDs Start: 05/13/22 2108     Code Status: Full Code  Family Communication: Friend/family at bedside  Patient status:Inpatient  Patient is from :Home  Anticipated discharge JQ:ZESP  Estimated DC date: Tomorrow after surgical clearance   Consultants: General surgery  Procedures: Laparoscopic cholecystectomy  Antimicrobials:  Anti-infectives (From admission, onward)    Start     Dose/Rate Route Frequency Ordered Stop   05/13/22 2230  metroNIDAZOLE (FLAGYL) IVPB 500 mg        500 mg 100 mL/hr over 60 Minutes Intravenous Every 12 hours 05/13/22 2218     05/13/22 2230  ciprofloxacin (CIPRO) IVPB 400 mg        400 mg 200 mL/hr over 60 Minutes Intravenous Every 12 hours 05/13/22 2229         Subjective: Patient seen and examined the bedside today.  He complains of pain mainly on the surgical wounds.  Does not complain of any epigastric pain.  No nausea or vomiting.  Objective: Vitals:   05/16/22 1620 05/16/22 1641 05/16/22 2033 05/17/22 0540  BP: (!) 151/91 139/84 (!) 156/96 (!) 153/96  Pulse: 76 70 86 86  Resp: '15 19 18   '$ Temp: 98.9 F (37.2 C) 98.1 F (36.7 C) 98.8 F (37.1 C) 98.2 F (36.8 C)  TempSrc:  Oral Oral Oral  SpO2: 90% 92% 95% 94%  Weight:      Height:        Intake/Output Summary (Last 24 hours) at 05/17/2022 1138 Last data filed at 05/16/2022 1540 Gross per 24 hour  Intake 700 ml  Output 5 ml  Net 695 ml   Filed Weights   05/13/22 1422  05/16/22 1232  Weight: 102 kg 102.1 kg    Examination:  General exam: Overall comfortable, not in distress HEENT: PERRL Respiratory system:  no wheezes or crackles  Cardiovascular system: S1 & S2 heard, RRR.  Gastrointestinal system: Abdomen is nondistended, soft and has mild generalized tenderness.  Surgical wounds.  Bowel sounds present Central nervous system: Alert and oriented Extremities: No edema, no clubbing ,no cyanosis Skin: No rashes, no ulcers,no icterus     Data Reviewed: I have personally reviewed following labs and imaging studies  CBC: Recent Labs  Lab 05/12/22 0040 05/12/22 0122 05/13/22 1439 05/14/22 0218 05/15/22 0806 05/16/22 0712 05/17/22 0759  WBC 8.9  --  7.9 6.1 6.4 6.6 12.8*  NEUTROABS 7.2  --   --   --   --  4.8 10.4*  HGB 15.7   < > 17.3* 15.6 14.9 15.0 16.5  HCT 48.8   < > 52.1* 47.0 46.2 43.6 49.1  MCV 84.6  --  83.8 84.4 84.3 81.2 82.9  PLT 132*  --  142* 123* 130* 145* 190   < > = values in this interval not displayed.   Basic Metabolic Panel: Recent Labs  Lab 05/13/22 1439 05/14/22 0218 05/15/22 0806 05/16/22 0712 05/17/22 0759  NA 136 136 135 135 135  K 3.8 3.9 3.9 3.6 4.0  CL 104 105 99 102 101  CO2 22 20* 19* 22 26  GLUCOSE 106* 112* 90 94 148*  BUN '14 12 13 18 12  '$ CREATININE 1.33* 1.44* 1.31* 1.19 1.32*  CALCIUM 8.7* 8.3* 9.0 9.0 9.0  MG  --   --   --  1.8 1.6*  PHOS  --   --   --  3.3 3.5     No results found for this or any previous visit (from the past 240 hour(s)).   Radiology Studies: DG Cholangiogram Operative  Result Date: 05/16/2022 CLINICAL DATA:  52 year old male with history of cholelithiasis. EXAM: INTRAOPERATIVE CHOLANGIOGRAM TECHNIQUE: Cholangiographic images from the C-arm fluoroscopic device were submitted for interpretation post-operatively. Please see the procedural report for the amount of contrast and the fluoroscopy time utilized. COMPARISON:  05/14/2022 FINDINGS: Intraoperative antegrade injection  via the cystic duct which opacifies the common bile duct and central portions of the intrahepatic biliary tree. Contrast is visualized flowing freely into the duodenum. There are no filling defects. No significant intra or extrahepatic biliary ductal dilation. No apparent anomalous anatomical configuration of the biliary tree. IMPRESSION: No evidence of choledocholithiasis. Ruthann Cancer, MD [facility] Radiology Electronically Signed   By: Ruthann Cancer M.D.   On: 05/16/2022 15:13    Scheduled Meds:  acetaminophen  1,000 mg Oral Q6H   enoxaparin (LOVENOX) injection  40 mg Subcutaneous Q24H   irbesartan  150 mg Oral Daily   methocarbamol  500 mg Oral TID   metoprolol succinate  50 mg Oral Daily   pantoprazole (PROTONIX) IV  40 mg Intravenous Q24H   Continuous Infusions:  sodium chloride     ciprofloxacin 400 mg (05/17/22 1132)   metronidazole 500 mg (05/17/22 0835)     LOS: 4 days   Shelly Coss,  MD [facility] P1/31/2024, 11:38 AM

## 2022-05-17 NOTE — Anesthesia Postprocedure Evaluation (Signed)
Anesthesia Post Note  Patient: Clayton Hernandez  Procedure(s) Performed: LAPAROSCOPIC CHOLECYSTECTOMY WITH INTRAOPERATIVE CHOLANGIOGRAM     Patient location during evaluation: PACU Anesthesia Type: General Level of consciousness: awake and alert Pain management: pain level controlled Vital Signs Assessment: post-procedure vital signs reviewed and stable Respiratory status: spontaneous breathing, nonlabored ventilation and respiratory function stable Cardiovascular status: blood pressure returned to baseline and stable Postop Assessment: no apparent nausea or vomiting Anesthetic complications: no   No notable events documented.  Last Vitals:  Vitals:   05/16/22 2033 05/17/22 0540  BP: (!) 156/96 (!) 153/96  Pulse: 86 86  Resp: 18   Temp: 37.1 C 36.8 C  SpO2: 95% 94%    Last Pain:  Vitals:   05/17/22 0540  TempSrc: Oral  PainSc:                  Lynda Rainwater

## 2022-05-17 NOTE — Progress Notes (Signed)
[facility] Progress Note  1 Day Post-Op  Subjective: Abdominal pain remains improved and now having pain consistent with post op soreness. Has been using IV pain medication due to nausea with oxycodone. Tolerating CLD without n/v/worsening abdominal pain. Passing flatus. No BM. ambulating  Objective: Vital signs in last 24 hours: Temp:  [98.1 F (36.7 C)-98.9 F (37.2 C)] 98.2 F (36.8 C) (01/31 0540) Pulse Rate:  [70-86] 86 (01/31 0540) Resp:  [15-20] 18 (01/30 2033) BP: (139-156)/(84-100) 153/96 (01/31 0540) SpO2:  [90 %-95 %] 94 % (01/31 0540) Weight:  [102.1 kg] 102.1 kg (01/30 1232)    Intake/Output from previous day: 01/30 0701 - 01/31 0700 In: 700 [I.V.:700] Out: 5 [Blood:5] Intake/Output this shift: No intake/output data recorded.  PE: Gen:  Alert, NAD, pleasant Abd: soft, ND, no epigastric TTP. Mild TTP around incisions which are c/d/I with surgical glue   Lab Results:  Recent Labs    05/15/22 0806 05/16/22 0712  WBC 6.4 6.6  HGB 14.9 15.0  HCT 46.2 43.6  PLT 130* 145*    BMET Recent Labs    05/15/22 0806 05/16/22 0712  NA 135 135  K 3.9 3.6  CL 99 102  CO2 19* 22  GLUCOSE 90 94  BUN 13 18  CREATININE 1.31* 1.19  CALCIUM 9.0 9.0    PT/INR No results for input(s): "LABPROT", "INR" in the last 72 hours.  CMP     Component Value Date/Time   NA 135 05/16/2022 0712   NA 140 04/24/2022 0931   K 3.6 05/16/2022 0712   CL 102 05/16/2022 0712   CO2 22 05/16/2022 0712   GLUCOSE 94 05/16/2022 0712   BUN 18 05/16/2022 0712   BUN 23 04/24/2022 0931   CREATININE 1.19 05/16/2022 0712   CALCIUM 9.0 05/16/2022 0712   PROT 5.8 (L) 05/16/2022 0712   PROT 7.1 04/24/2022 0931   ALBUMIN 2.8 (L) 05/16/2022 0712   ALBUMIN 4.5 04/24/2022 0931   AST 62 (H) 05/16/2022 0712   ALT 90 (H) 05/16/2022 0712   ALKPHOS 290 (H) 05/16/2022 0712   BILITOT 3.4 (H) 05/16/2022 0712   BILITOT 0.7 04/24/2022 0931   GFRNONAA >60 05/16/2022 0712   GFRAA 69  03/26/2020 1154   Lipase     Component Value Date/Time   LIPASE 79 (H) 05/16/2022 0712       Studies/Results: DG Cholangiogram Operative  Result Date: 05/16/2022 CLINICAL DATA:  52 year old male with history of cholelithiasis. EXAM: INTRAOPERATIVE CHOLANGIOGRAM TECHNIQUE: Cholangiographic images from the C-arm fluoroscopic device were submitted for interpretation post-operatively. Please see the procedural report for the amount of contrast and the fluoroscopy time utilized. COMPARISON:  05/14/2022 FINDINGS: Intraoperative antegrade injection via the cystic duct which opacifies the common bile duct and central portions of the intrahepatic biliary tree. Contrast is visualized flowing freely into the duodenum. There are no filling defects. No significant intra or extrahepatic biliary ductal dilation. No apparent anomalous anatomical configuration of the biliary tree. IMPRESSION: No evidence of choledocholithiasis. Ruthann Cancer, MD [facility] Radiology Electronically Signed   By: Ruthann Cancer M.D.   On: 05/16/2022 15:13    Anti-infectives: Anti-infectives (From admission, onward)    Start     Dose/Rate Route Frequency Ordered Stop   05/13/22 2230  metroNIDAZOLE (FLAGYL) IVPB 500 mg        500 mg 100 mL/hr over 60 Minutes Intravenous Every 12 hours 05/13/22 2218     05/13/22 2230  ciprofloxacin (CIPRO) IVPB  400 mg        400 mg 200 mL/hr over 60 Minutes Intravenous Every 12 hours 05/13/22 2229          Assessment/Plan Gallstone pancreatitis Elevated LFTs, hyperbilirubinemia  POD 1 s/p lap chole with negative IOC 1/30 Dr. Marlou Starks - MRCP negative for choledocholithiasis, no intrahepatic or extrahepatic ductal dilatation - LFTs continue to improve overall. T bili now 3.0 (3.4) - going to try oxycodone today with nausea med (has caused nausea in the past). Add scheduled tylenol and robaxin - tolerating CLD. Advance to soft but encouraged  patient to go very slowly. - pending pain control and diet tolerance possibly stable for dc from surgical standpoint this afternoon vs tomorrow am  ID - cipro/flagyl FEN - IVF, soft VTE - SCDs, lovenox Foley - none  Hx Ascending aortic aneurysm s/p repair  HTN HLD GERD OSA CKD-III Thrombocytopenia   LOS: 4 days    Winferd Humphrey, [facility] 05/17/2022, 8:35 AM Please see Amion for pager number during day hours 7:00am-4:30pm

## 2022-05-17 NOTE — Discharge Instructions (Signed)
[facility]  Please arrive at least 30 min before your appointment to complete your check in paperwork.  If you are unable to arrive 30 min prior to your appointment time we may have to cancel or reschedule you. LAPAROSCOPIC SURGERY: POST OP INSTRUCTIONS Always review your discharge instruction sheet given to you by the facility where your surgery was performed. IF YOU HAVE DISABILITY OR FAMILY LEAVE FORMS, YOU MUST BRING THEM TO THE OFFICE FOR PROCESSING.   DO NOT GIVE THEM TO YOUR DOCTOR.  PAIN CONTROL  First take acetaminophen (Tylenol) AND/or ibuprofen (Advil) to control your pain after surgery.  Follow directions on package.  Taking acetaminophen (Tylenol) and/or ibuprofen (Advil) regularly after surgery will help to control your pain and lower the amount of prescription pain medication you may need.  You should not take more than 4,000 mg (4 grams) of acetaminophen (Tylenol) in 24 hours.  You should not take ibuprofen (Advil), aleve, motrin, naprosyn or other NSAIDS if you have a history of stomach ulcers or chronic kidney disease.  A prescription for pain medication may be given to you upon discharge.  Take your pain medication as prescribed, if you still have uncontrolled pain after taking acetaminophen (Tylenol) or ibuprofen (Advil). Use ice packs to help control pain. If you need a refill on your pain medication, please contact your pharmacy.  They will contact our office to request authorization. Prescriptions will not be filled after 5pm or on week-ends.  HOME MEDICATIONS Take your usually prescribed medications unless otherwise directed.  DIET You should follow a light diet the first few days after arrival home.  Be sure to include lots of fluids daily. Avoid fatty, fried foods.   CONSTIPATION It is common to experience some constipation after surgery and if you are taking pain medication.  Increasing fluid intake and taking a stool softener (such as Colace)  will usually help or prevent this problem from occurring.  A mild laxative (Milk of Magnesia or Miralax) should be taken according to package instructions if there are no bowel movements after 48 hours.  WOUND/INCISION CARE Most patients will experience some swelling and bruising in the area of the incisions.  Ice packs will help.  Swelling and bruising can take several days to resolve.  Unless discharge instructions indicate otherwise, follow guidelines below  STERI-STRIPS - you may remove your outer bandages 48 hours after surgery, and you may shower at that time.  You have steri-strips (small skin tapes) in place directly over the incision.  These strips should be left on the skin for 7-10 days.   DERMABOND/SKIN GLUE - you may shower in 24 hours.  The glue will flake off over the next 2-3 weeks. Any sutures or staples will be removed at the office during your follow-up visit.  ACTIVITIES You may resume regular (light) daily activities beginning the next day--such as daily self-care, walking, climbing stairs--gradually increasing activities as tolerated.  You may have sexual intercourse when it is comfortable.  Refrain from any heavy lifting or straining until approved by your doctor. You may drive when you are no longer taking prescription pain medication, you can comfortably wear a seatbelt, and you can safely maneuver your car and apply brakes.  FOLLOW-UP You should see your doctor in the office for a follow-up appointment approximately 2-3 weeks after your surgery.  You should have been given your post-op/follow-up appointment when your surgery was scheduled.  If you did not receive a post-op/follow-up appointment, make sure   that you call for this appointment within a day or two after you arrive home to insure a convenient appointment time.   WHEN TO CALL YOUR DOCTOR: Fever over 101.0 Inability to urinate Continued bleeding from incision. Increased pain, redness, or drainage from the  incision. Increasing abdominal pain  The clinic staff is available to answer your questions during regular business hours.  Please don't hesitate to call and ask to speak to one of the nurses for clinical concerns.  If you have a medical emergency, go to the nearest emergency room or call 911.  A surgeon from [facility] is always on call at the hospital. [address], [address], [place], [place]  [address] ? [address] [address], [place], [place]   [address] [phone] ? [phone] ? FAX [phone]  

## 2022-05-18 DIAGNOSIS — K8070 Calculus of gallbladder and bile duct without cholecystitis without obstruction: Secondary | ICD-10-CM | POA: Diagnosis not present

## 2022-05-18 LAB — CBC
HCT: 46.9 % (ref 39.0–52.0)
Hemoglobin: 15.9 g/dL (ref 13.0–17.0)
MCH: 27.7 pg (ref 26.0–34.0)
MCHC: 33.9 g/dL (ref 30.0–36.0)
MCV: 81.7 fL (ref 80.0–100.0)
Platelets: 175 10*3/uL (ref 150–400)
RBC: 5.74 MIL/uL (ref 4.22–5.81)
RDW: 18 % — ABNORMAL HIGH (ref 11.5–15.5)
WBC: 9.9 10*3/uL (ref 4.0–10.5)
nRBC: 0 % (ref 0.0–0.2)

## 2022-05-18 LAB — BASIC METABOLIC PANEL
Anion gap: 9 (ref 5–15)
BUN: 13 mg/dL (ref 6–20)
CO2: 23 mmol/L (ref 22–32)
Calcium: 8.7 mg/dL — ABNORMAL LOW (ref 8.9–10.3)
Chloride: 103 mmol/L (ref 98–111)
Creatinine, Ser: 1.3 mg/dL — ABNORMAL HIGH (ref 0.61–1.24)
GFR, Estimated: >60 mL/min (ref >60)
Glucose, Bld: 145 mg/dL — ABNORMAL HIGH (ref 70–99)
Potassium: 3.7 mmol/L (ref 3.5–5.1)
Sodium: 135 mmol/L (ref 135–145)

## 2022-05-18 LAB — MAGNESIUM: Magnesium: 1.9 mg/dL (ref 1.7–2.4)

## 2022-05-18 NOTE — Progress Notes (Signed)
Wray Kearns to be D/C'd Home per MD order.  Discussed with the patient and all questions fully answered.  VSS, Skin clean, dry and intact without evidence of skin break down, no evidence of skin tears noted. IV catheter discontinued intact. Site without signs and symptoms of complications. Dressing and pressure applied.  An After Visit Summary was printed and given to the patient.   D/c education completed with patient/family including follow up instructions, medication list, d/c activities limitations if indicated, with other d/c instructions as indicated by MD - patient able to verbalize understanding, all questions fully answered.   Patient instructed to return to ED, call 911, or call MD for any changes in condition.   Patient escorted via [place], and D/C home via private auto.  Manuella Ghazi 05/18/2022 9:57 AM

## 2022-05-18 NOTE — Discharge Summary (Signed)
Physician Discharge Summary  Clayton Hernandez QXI:[id number] DOB: 11/16/1970 DOA: 05/13/2022  PCP: [name], MD  Admit date: 05/13/2022 Discharge date: 05/18/2022  Admitted From: Home Disposition:  Home  Discharge Condition:Stable CODE STATUS:FULL Diet recommendation: Heart Healthy Brief/Interim Summary:  Patient is a 52 year old male with history of stage IIa CKD, nonalcoholic fatty liver disease, hypertension, GERD who presented initially with abdominal pain.  He had history of gallstones and was following with outpatient surgery.  CT abdomen/pelvis done on this admission showed cholelithiasis, gallbladder distention.  Lipase was elevated 661, blood work showed elevated liver enzymes .patient was admitted for management of biliary pancreatitis.  General surgery consulted.  Underwent laparoscopic cholecystectomy on 1/30.  Hospital course remarkable for persistent pain,now significantly improved.  Medically stable for discharge home today.  Following problems were addressed during the hospitalization:   Acute biliary pancreatitis: Presented with acute onset of abdominal pain, jaundice, nausea.  Known history of cholelithiasis.  General surgery consulted. Elevated liver enzymes, lipase on presentation.  MRCP showed cholelithiasis, no CBD dilatation. Underwent laparoscopic cholecystectomy on 1/30.  Significant improvement in the liver enzymes, leukocytosis improved.  General surgery cleared for discharge.   Hypertension:  Continue home medications.  Monitor blood pressure at home.  GERD: Continue PPI   Hyperlipidemia: Was on a statin at home, currently on hold due to elevated liver enzymes, will resume on discharge   CKD stage IIIa: Baseline creatinine is up from 1.2-1.4.  Currently kidney function  at baseline   History of ascending aortic aneurysm: Status postrepair.  Recommend yearly surveillance   History of colon cancer: S/p resection.  Currently in remission   OSA: On  CPAP   Hypomagnesemia: Supplemented with potassium   Thrombocytopenia: Chronic,mild .  Most likely associated with  with history of nonalcoholic fatty liver disease.  No history of alcohol use   Discharge Diagnoses:  Principal Problem:   Cholelithiasis with concern for choledocholithiasis Active Problems:   Polycythemia   Thrombocytopenia (HCC)   Ascending aortic aneurysm s/p repair   Essential hypertension   CKD (chronic kidney disease) stage 3, GFR 30-59 ml/min (HCC)   GERD (gastroesophageal reflux disease)   Dyslipidemia   OSA (obstructive sleep apnea)   History of colon cancer    Discharge Instructions  Discharge Instructions     Diet - low sodium heart healthy   Complete by: As directed    Discharge instructions   Complete by: As directed    1)You will be called by general surgery for follow-up appointment   Increase activity slowly   Complete by: As directed       Allergies as of 05/18/2022       Reactions   Morphine And Related Other (See Comments)   "all opiates" - "they'll kill me"   Penicillins Anaphylaxis   "All cillins"   Toradol [ketorolac Tromethamine] Other (See Comments)   Unknown reaction (possible rash)        Medication List     TAKE these medications    acetaminophen 500 MG tablet Commonly known as: TYLENOL Take 1,000 mg by mouth every 6 (six) hours as needed for moderate pain.   cyanocobalamin 1000 MCG/ML injection Commonly known as: VITAMIN B12 Inject 1 mL (1,000 mcg total) into the muscle every 30 (thirty) days.   dicyclomine 20 MG tablet Commonly known as: BENTYL Take 1 tablet (20 mg total) by mouth every 12 (twelve) hours as needed (for abdominal pain).   fenofibrate 145 MG tablet Commonly known as: Tricor Take  1 tablet (145 mg total) by mouth daily.   icosapent Ethyl 1 g capsule Commonly known as: Vascepa Take 1 capsule (1 g total) by mouth 2 (two) times daily.   Ipratropium-Albuterol 20-100 MCG/ACT Aers  respimat Commonly known as: COMBIVENT Inhale 1 puff into the lungs 4 (four) times daily. What changed:  when to take this reasons to take this   metoprolol succinate 50 MG 24 hr tablet Commonly known as: TOPROL-XL Take 1 tablet (50 mg total) by mouth daily. Take with or immediately following a meal.   multivitamin with minerals Tabs tablet Take 1 tablet by mouth daily. Men's One A Day   omeprazole 20 MG capsule Commonly known as: PRILOSEC Take 1 capsule (20 mg total) by mouth daily.   ondansetron 4 MG disintegrating tablet Commonly known as: ZOFRAN-ODT Take 1 tablet (4 mg total) by mouth every 8 (eight) hours as needed for nausea or vomiting.   oxyCODONE-acetaminophen 5-325 MG tablet Commonly known as: PERCOCET/ROXICET Take 1-2 tablets by mouth every 6 (six) hours as needed for severe pain.   oxymetazoline 0.05 % nasal spray Commonly known as: AFRIN Place 1 spray into both nostrils daily as needed for congestion.   rosuvastatin 20 MG tablet Commonly known as: CRESTOR TAKE 1 TABLET BY MOUTH EVERY DAY   valsartan 160 MG tablet Commonly known as: DIOVAN Take 1 tablet (160 mg total) by mouth 2 (two) times daily. What changed: when to take this   zolpidem 10 MG tablet Commonly known as: AMBIEN Take 10 mg by mouth at bedtime as needed for sleep.        Follow-up Information     Maczis, Carlena Hurl, [place]. Call.   Specialty: General Surgery Why: We are making a follow up appointment for you., Please call to confirm appointment time., Arrive 30 minutes early to complete check in, and bring photo ID and insurance card. Contact information: [place] [address] [phone]                Allergies  Allergen Reactions   Morphine And Related Other (See Comments)    "all opiates" - "they'll kill me"   Penicillins Anaphylaxis    "All cillins"   Toradol [Ketorolac Tromethamine] Other (See Comments)     Unknown reaction (possible rash)    Consultations: Surgery   Procedures/Studies: DG Cholangiogram Operative  Result Date: 05/16/2022 CLINICAL DATA:  52 year old male with history of cholelithiasis. EXAM: INTRAOPERATIVE CHOLANGIOGRAM TECHNIQUE: Cholangiographic images from the C-arm fluoroscopic device were submitted for interpretation post-operatively. Please see the procedural report for the amount of contrast and the fluoroscopy time utilized. COMPARISON:  05/14/2022 FINDINGS: Intraoperative antegrade injection via the cystic duct which opacifies the common bile duct and central portions of the intrahepatic biliary tree. Contrast is visualized flowing freely into the duodenum. There are no filling defects. No significant intra or extrahepatic biliary ductal dilation. No apparent anomalous anatomical configuration of the biliary tree. IMPRESSION: No evidence of choledocholithiasis. Ruthann Cancer, MD [facility] Radiology Electronically Signed   By: Ruthann Cancer M.D.   On: 05/16/2022 15:13   MR ABDOMEN MRCP W WO CONTAST  Result Date: 05/14/2022 CLINICAL DATA:  Cholelithiasis, evaluate for choledocholithiasis EXAM: MRI ABDOMEN WITHOUT AND WITH CONTRAST (INCLUDING MRCP) TECHNIQUE: Multiplanar multisequence MR imaging of the abdomen was performed both before and after the administration of intravenous contrast. Heavily T2-weighted images of the biliary and pancreatic ducts were obtained, and three-dimensional MRCP  images were rendered by post processing. CONTRAST:  64m GADAVIST GADOBUTROL 1 MMOL/ML IV SOLN COMPARISON:  CT abdomen/pelvis dated 05/13/2022 FINDINGS: Motion degraded images. Lower chest: Lung bases are clear. Hepatobiliary: Liver is within normal limits. Gallbladder is notable for layering gallstones (series 3/image 29), mild gallbladder distension, and mild gallbladder wall thickening. No definite pericholecystic inflammatory changes. Mild  periportal edema. No intrahepatic or extrahepatic ductal dilatation. Common duct measures 5 mm. No choledocholithiasis is seen. Pancreas:  Within normal limits. Spleen:  Within normal limits. Adrenals/Urinary Tract:  Adrenal glands are within normal limits. Left renal sinus cysts. Right upper pole renal cysts measuring up to 18 mm (series 3/image 34), simple, benign (Bosniak I). No follow-up is recommended. No hydronephrosis. Stomach/Bowel: Stomach is within normal limits. Visualized bowel is unremarkable. Vascular/Lymphatic:  No evidence of abdominal aortic aneurysm. No suspicious abdominal lymphadenopathy. Other:  No abdominal ascites. Musculoskeletal: No focal osseous lesions. IMPRESSION: Motion degraded images. Cholelithiasis, with mild gallbladder distension and mild gallbladder wall thickening. No definite pericholecystic inflammatory changes to suggest early acute cholecystitis. No intrahepatic or extrahepatic ductal dilatation. Common duct measures 5 mm. No choledocholithiasis is seen. Electronically Signed   By: SJulian HyM.D.   On: 05/14/2022 02:34   CT ABDOMEN PELVIS W CONTRAST  Result Date: 05/13/2022 CLINICAL DATA:  Epigastric abdominal pain. EXAM: CT ABDOMEN AND PELVIS WITH CONTRAST TECHNIQUE: Multidetector CT imaging of the abdomen and pelvis was performed using the standard protocol following bolus administration of intravenous contrast. RADIATION DOSE REDUCTION: This exam was performed according to the departmental dose-optimization program which includes automated exposure control, adjustment of the mA and/or kV according to patient size and/or use of iterative reconstruction technique. CONTRAST:  738mOMNIPAQUE IOHEXOL 350 MG/ML SOLN COMPARISON:  May 12, 2022. FINDINGS: Lower chest: No acute abnormality. Hepatobiliary: Cholelithiasis and stable gallbladder distention is noted. No biliary dilatation is noted. Liver is unremarkable. Pancreas: Unremarkable. No pancreatic ductal  dilatation or surrounding inflammatory changes. Spleen: Normal in size without focal abnormality. Adrenals/Urinary Tract: Adrenal glands appear normal. Stable small bilateral renal cysts are noted for which no further follow-up is required. No hydronephrosis or renal obstruction is noted. Urinary bladder is unremarkable. Stomach/Bowel: Stomach is within normal limits. Appendix appears normal. No evidence of bowel wall thickening, distention, or inflammatory changes. Stable postsurgical changes involving distal ileum and sigmoid colon. Vascular/Lymphatic: No significant vascular findings are present. Stable minimally enlarged mesenteric lymph nodes which most likely are reactive in etiology. Reproductive: Prostate is unremarkable. Other: Small fat containing periumbilical hernia. No ascites is noted. Musculoskeletal: No acute or significant osseous findings. IMPRESSION: Mild cholelithiasis and gallbladder is distention is noted which is unchanged compared to prior exam performed yesterday. Biliary dilatation is noted. Gastric distention noted on prior exam of yesterday is resolved. Small bilateral renal cysts are noted which are unchanged compared to prior exam performed yesterday. No further follow-up is required Small fat containing periumbilical hernia is noted which is unchanged compared to prior exam performed yesterday. Electronically Signed   By: JaMarijo Conception.D.   On: 05/13/2022 18:37   CT Angio Chest/Abd/Pel for Dissection W and/or Wo Contrast  Result Date: 05/12/2022 CLINICAL DATA:  Presents with epigastric pain and chest pain. History of 04/01/2020 ascending aortic aneurysm repair need to check for dissection or recurring aneurysm. EXAM: CT ANGIOGRAPHY CHEST, ABDOMEN AND PELVIS TECHNIQUE: Non-contrast CT of the chest was initially obtained. Multidetector CT imaging through the chest, abdomen and pelvis was performed using the standard protocol during bolus administration of intravenous  contrast.  Multiplanar reconstructed images and MIPs were obtained and reviewed to evaluate the vascular anatomy. RADIATION DOSE REDUCTION: This exam was performed according to the departmental dose-optimization program which includes automated exposure control, adjustment of the mA and/or kV according to patient size and/or use of iterative reconstruction technique. CONTRAST:  139m OMNIPAQUE IOHEXOL 350 MG/ML SOLN COMPARISON:  Preoperative CTA chest 02/19/2020. FINDINGS: CTA CHEST FINDINGS Cardiovascular: There are no visible coronary or aortic calcifications. There are intact median sternotomy sutures, well-healed median sternotomy, and postsurgical changes compatible with interval sleeve repair of the previously noted 4.7 cm ascending aortic aneurysm. There is mild descending aortic tortuosity. The great vessels are clear. There is no aortic dissection or stenosis. Post repair the aorta is within normal caliber limits except for a mild dilatation in the root at the sinuses of Valsalva where the diameter is 4.2 cm, previously 4.0 cm. There is mild panchamber cardiomegaly. No pericardial effusion is seen. The pulmonary veins are decompressed but there is prominence of the pulmonary trunk, which measures 5.6 cm consistent with arterial hypertension, previously measuring 3.1 cm. No arterial embolism is seen or findings of acute right heart strain. Mediastinum/Nodes: No enlarged mediastinal, hilar, or axillary lymph nodes. Thyroid gland, trachea, and esophagus demonstrate no significant findings. Mild lipomatosis is again noted in the superior mediastinum, increased. Asymmetric elevation of the right hemidiaphragm is unchanged. Lungs/Pleura: No pleural effusion, thickening or pneumothorax. There is mild posterior atelectasis. There is mild bronchial thickening diffusely without visible bronchial plugging. There are no pulmonary infiltrate or nodule. Musculoskeletal: No acute or significant osseous findings. No chest wall mass.  Review of the MIP images confirms the above findings. CTA ABDOMEN AND PELVIS FINDINGS VASCULAR Aorta: Normal caliber aorta without aneurysm, dissection, vasculitis or significant stenosis. There are trace calcific plaques distally. Celiac: Normal. SMA: Normal. Renals: Both are single.  Both are normal. IMA: Normal. Inflow: Patent without evidence of aneurysm, dissection, vasculitis or significant stenosis. There are minimal calcific plaques in both common iliac arteries. Veins: Unopacified and not evaluated. Review of the MIP images confirms the above findings. NON-VASCULAR Hepatobiliary: The liver is 18.5 cm in length with mild-to-moderate steatosis. A flash filling hemangioma, again measuring 1 cm, is redemonstrated in the posterior segment of the right lobe on 7:125. No other focal abnormality is seen. Gallbladder is dilated to 10.5 cm. There are multiple small stones posteriorly. Equivocal wall thickening on the coronal reformatting. Correlate clinically for cholecystitis. Ultrasound may be helpful. There is no biliary dilatation. Pancreas: No abnormality. Spleen: Mild splenomegaly, splenic AP diameter 14.7 cm, previously 13.9 cm. No mass. Adrenals/Urinary Tract: There is no adrenal mass. There is homogeneous thin walled 1.6 cm cyst in the posterior aspect of the right kidney. Hounsfield density is 8.3. No follow-up imaging is recommended. There is a 1.1 cm homogeneous cyst in the posterior upper left kidney, Hounsfield density is 2. No follow-up imaging is recommended. The rest of both kidneys enhance homogeneously. There is no urinary stone or obstruction. The bladder unremarkable for the degree of distention. Stomach/Bowel: Stomach distended with fluid and food products. No wall thickening or duodenal dilatation. This could reflect changes due to a recent ingestion or due to impaired gastric emptying. There is no small bowel obstruction or inflammation. There is postsurgical change of ileocecal junction  compatible with a distal ileectomy and side-to-side anastomosis. The appendix remains and is normal caliber. There is diverticulosis. There is hazy reaction along side the junction of the descending and sigmoid colon which could be due  to a mild diverticulitis or fat scarring from old diverticular disease. More distally there is a rectosigmoid colocolic surgical anastomosis which is well patent. Lymphatic: There are multiple borderline to minimally prominent mesenteric lymph nodes in the root fat and a few in the right lower quadrant go Ward haziness in the mesenteric root fat. Query mesenteric adenitis/panniculitis or fibrosing mesenteritis. No further adenopathy. Reproductive: Prostate is unremarkable. Other: Small umbilical fat hernia. No incarcerated hernias. No free air, free fluid or hemorrhage Musculoskeletal: No acute or significant osseous findings. Review of the MIP images confirms the above findings. IMPRESSION: 1. No findings of acute aortic syndrome. 2. Postsurgical changes of the ascending aorta with mild dilatation in the root at the sinuses of Valsalva where the diameter is 4.2 cm, previously 4.0 cm. The remainder is within normal caliber limits post repair. Recommend annual imaging followup by CTA or MRA. This recommendation follows 2010 ACCF/AHA/AATS/ACR/ASA/SCA/SCAI/SIR/STS/SVM Guidelines for the Diagnosis and Management of Patients with Thoracic Aortic Disease. Circulation. 2010; 121: Z563-O756. Aortic aneurysm NOS (ICD10-I71.9) 3. Cardiomegaly without evidence of CHF. 4. Prominent pulmonary trunk consistent with arterial hypertension, increased in prominence from 2021. 5. Bronchitis.  No visible focal pneumonia. 6. Distended gallbladder with cholelithiasis and equivocal wall thickening. Correlate clinically for acute cholecystitis. Ultrasound may be helpful. 7. Distended stomach with fluid and food products. This could be due to a recent ingestion or impaired gastric emptying. 8. Diverticulosis  with hazy reaction along side the junction of the descending and sigmoid colon, which could be due to a mild diverticulitis or fat scarring from old diverticular disease. No free air or diverticular abscess. 9. Mild hepatosplenomegaly with mild hepatic steatosis and stable 1 cm flash filling hemangioma in the right lobe. 10. Small umbilical fat hernia. 11. Multiple borderline to minimally prominent mesenteric nodes in the root fat and a few in the right lower quadrant with "misty" mesentery. Query mesenteric adenitis/panniculitis or fibrosing mesenteritis. Electronically Signed   By: Telford Nab M.D.   On: 05/12/2022 04:18   US Abdomen Limited  Result Date: 05/12/2022 CLINICAL DATA:  Right upper quadrant abdominal pain. EXAM: ULTRASOUND ABDOMEN LIMITED RIGHT UPPER QUADRANT COMPARISON:  None Available. FINDINGS: Gallbladder: There multiple gallstones. No gallbladder wall thickening or pericholecystic fluid. Negative sonographic Murphy's sign. Common bile duct: Diameter: 5 mm Liver: There is diffuse increased liver echogenicity most commonly seen in the setting of fatty infiltration. Superimposed inflammation or fibrosis is not excluded. Clinical correlation is recommended. Portal vein is patent on color Doppler imaging with normal direction of blood flow towards the liver. Other: None. IMPRESSION: 1. Cholelithiasis without sonographic evidence of acute cholecystitis. 2. Fatty liver. Electronically Signed   By: Anner Crete M.D.   On: 05/12/2022 02:55      Subjective: Patient seen and examined at bedside today.  Hemodynamically stable for discharge.  Denies any abdomen pain, nausea or vomiting  Discharge Exam: Vitals:   05/18/22 0438 05/18/22 0806  BP: (!) 158/104 131/89  Pulse: 84 74  Resp: 18 18  Temp: 98.3 F (36.8 C) 98.7 F (37.1 C)  SpO2: 97% 95%   Vitals:   05/17/22 2018 05/17/22 2249 05/18/22 0438 05/18/22 0806  BP: (!) 154/97 (!) 138/96 (!) 158/104 131/89  Pulse: 75 74 84 74   Resp: '18 19 18 18  '$ Temp: 98.3 F (36.8 C) 98.6 F (37 C) 98.3 F (36.8 C) 98.7 F (37.1 C)  TempSrc: Oral Oral Oral Oral  SpO2: 95% 99% 97% 95%  Weight:  Height:        General: Pt is alert, awake, not in acute distress Cardiovascular: RRR, S1/S2 +, no rubs, no gallops Respiratory: CTA bilaterally, no wheezing, no rhonchi Abdominal: Soft, NT, ND, bowel sounds +, laparoscopic surgery scars Extremities: no edema, no cyanosis    The results of significant diagnostics from this hospitalization (including imaging, microbiology, ancillary and laboratory) are listed below for reference.     Microbiology: No results found for this or any previous visit (from the past 240 hour(s)).   Labs: BNP (last 3 results) No results for input(s): "BNP" in the last 8760 hours. Basic Metabolic Panel: Recent Labs  Lab 05/14/22 0218 05/15/22 0806 05/16/22 0712 05/17/22 0759 05/18/22 0437  NA 136 135 135 135 135  K 3.9 3.9 3.6 4.0 3.7  CL 105 99 102 101 103  CO2 20* 19* '22 26 23  '$ GLUCOSE 112* 90 94 148* 145*  BUN '12 13 18 12 13  '$ CREATININE 1.44* 1.31* 1.19 1.32* 1.30*  CALCIUM 8.3* 9.0 9.0 9.0 8.7*  MG  --   --  1.8 1.6* 1.9  PHOS  --   --  3.3 3.5  --    Liver Function Tests: Recent Labs  Lab 05/13/22 1439 05/14/22 0218 05/15/22 0806 05/16/22 0712 05/17/22 0759  AST 343* 241* 99* 62* 81*  ALT 246* 196* 127* 90* 80*  ALKPHOS 336* 286* 304* 290* 276*  BILITOT 8.7* 8.1* 4.2* 3.4* 3.0*  PROT 6.4* 6.0* 5.8* 5.8* 6.7  ALBUMIN 3.6 3.0* 2.9* 2.8* 3.2*   Recent Labs  Lab 05/12/22 0040 05/13/22 1439 05/14/22 0218 05/15/22 0812 05/16/22 0712  LIPASE 46 661* 380* 71* 79*   No results for input(s): "AMMONIA" in the last 168 hours. CBC: Recent Labs  Lab 05/12/22 0040 05/12/22 0122 05/14/22 0218 05/15/22 0806 05/16/22 0712 05/17/22 0759 05/18/22 0437  WBC 8.9   < > 6.1 6.4 6.6 12.8* 9.9  NEUTROABS 7.2  --   --   --  4.8 10.4*  --   HGB 15.7   < > 15.6 14.9 15.0 16.5  15.9  HCT 48.8   < > 47.0 46.2 43.6 49.1 46.9  MCV 84.6   < > 84.4 84.3 81.2 82.9 81.7  PLT 132*   < > 123* 130* 145* 190 175   < > = values in this interval not displayed.   Cardiac Enzymes: No results for input(s): "CKTOTAL", "CKMB", "CKMBINDEX", "TROPONINI" in the last 168 hours. BNP: Invalid input(s): "POCBNP" CBG: No results for input(s): "GLUCAP" in the last 168 hours. D-Dimer No results for input(s): "DDIMER" in the last 72 hours. Hgb A1c No results for input(s): "HGBA1C" in the last 72 hours. Lipid Profile No results for input(s): "CHOL", "HDL", "LDLCALC", "TRIG", "CHOLHDL", "LDLDIRECT" in the last 72 hours. Thyroid function studies No results for input(s): "TSH", "T4TOTAL", "T3FREE", "THYROIDAB" in the last 72 hours.  Invalid input(s): "FREET3" Anemia work up No results for input(s): "VITAMINB12", "FOLATE", "FERRITIN", "TIBC", "IRON", "RETICCTPCT" in the last 72 hours. Urinalysis    Component Value Date/Time   COLORURINE AMBER (A) 05/13/2022 1449   APPEARANCEUR CLEAR 05/13/2022 1449   LABSPEC 1.023 05/13/2022 1449   PHURINE 5.0 05/13/2022 1449   GLUCOSEU NEGATIVE 05/13/2022 1449   HGBUR NEGATIVE 05/13/2022 1449   BILIRUBINUR MODERATE (A) 05/13/2022 1449   KETONESUR NEGATIVE 05/13/2022 1449   PROTEINUR NEGATIVE 05/13/2022 1449   NITRITE NEGATIVE 05/13/2022 1449   LEUKOCYTESUR NEGATIVE 05/13/2022 1449   Sepsis Labs Recent Labs  Lab 05/15/22  9969 05/16/22 0712 05/17/22 0759 05/18/22 0437  WBC 6.4 6.6 12.8* 9.9   Microbiology No results found for this or any previous visit (from the past 240 hour(s)).  Please note: You were cared for by a hospitalist during your hospital stay. Once you are discharged, your primary care physician will handle any further medical issues. Please note that NO REFILLS for any discharge medications will be authorized once you are discharged, as it is imperative that you return to your primary care physician (or establish a  relationship with a primary care physician if you do not have one) for your post hospital discharge needs so that they can reassess your need for medications and monitor your lab values.    Time coordinating discharge: 40 minutes  SIGNED:   Shelly Coss, MD  [facility] 05/18/2022, 9:46 AM Pager [phone]  If 7PM-7AM, please contact night-coverage [email or web address] Password [email or web address]

## 2022-05-22 ENCOUNTER — Telehealth: Payer: Self-pay

## 2022-05-22 NOTE — Telephone Encounter (Signed)
Transition Care Management Unsuccessful Follow-up Telephone Call  Date of discharge and from where:[name] 05-18-22 Dx:   Cholelithiasis with concern for choledocholithiasis    Attempts:  1st Attempt  Reason for unsuccessful TCM follow-up call:  Left voice message   Juanda Crumble LPN [employer] Direct Dial [phone]

## 2022-05-23 NOTE — Telephone Encounter (Signed)
Transition Care Management Unsuccessful Follow-up Telephone Call  Date of discharge and from where:    [place] 05-18-22 Dx:   Cholelithiasis with concern for choledocholithiasis    Attempts:  2nd Attempt  Reason for unsuccessful TCM follow-up call:  Left voice message   Juanda Crumble LPN [place] Direct Dial [phone]

## 2022-05-24 ENCOUNTER — Encounter: Payer: Self-pay | Admitting: *Deleted

## 2022-05-24 ENCOUNTER — Other Ambulatory Visit: Payer: Self-pay | Admitting: *Deleted

## 2022-05-24 DIAGNOSIS — R002 Palpitations: Secondary | ICD-10-CM

## 2022-06-23 IMAGING — DX DG CHEST 1V PORT
1 series · 1 of 1 positions shown · non-contrast
Comparison: 04/05/2020

CLINICAL DATA: Shortness of breath, history of prior aortic repair

EXAM:
PORTABLE CHEST 1 VIEW

[chest ap]
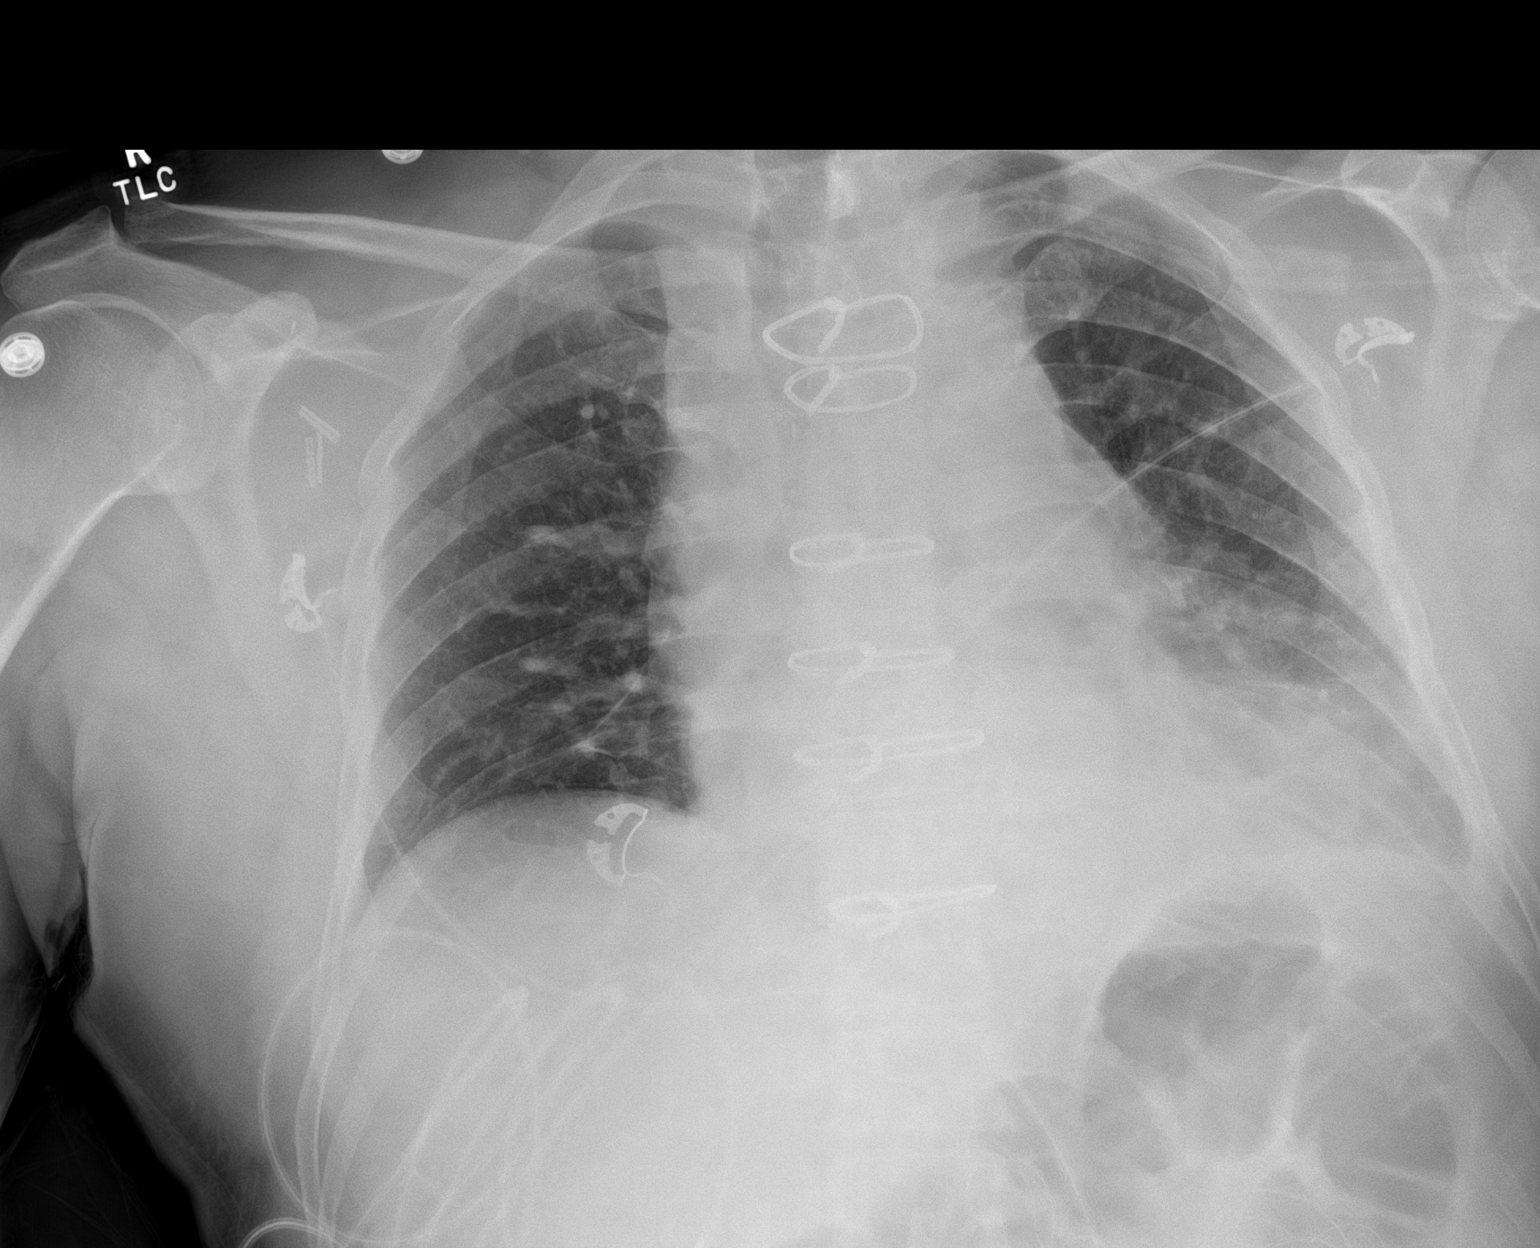

[1 of 1 positions shown; findings below may reference images not displayed]

FINDINGS: Cardiac shadow is enlarged but stable. Postsurgical changes are
again noted. Small left pleural effusion is noted. No focal
infiltrate is seen. No bony abnormality is noted.
IMPRESSION: Small left-sided pleural effusion.  No focal infiltrate is noted.

## 2022-06-24 ENCOUNTER — Other Ambulatory Visit: Payer: Self-pay | Admitting: Family Medicine

## 2022-06-25 IMAGING — CR DG CHEST 2V
2 series · 2 of 2 positions shown · non-contrast
Comparison: April 06, 2020

CLINICAL DATA: Recent abdominal aortic aneurysm repair. Pleural
effusion

EXAM:
CHEST - 2 VIEW

[chest pa]
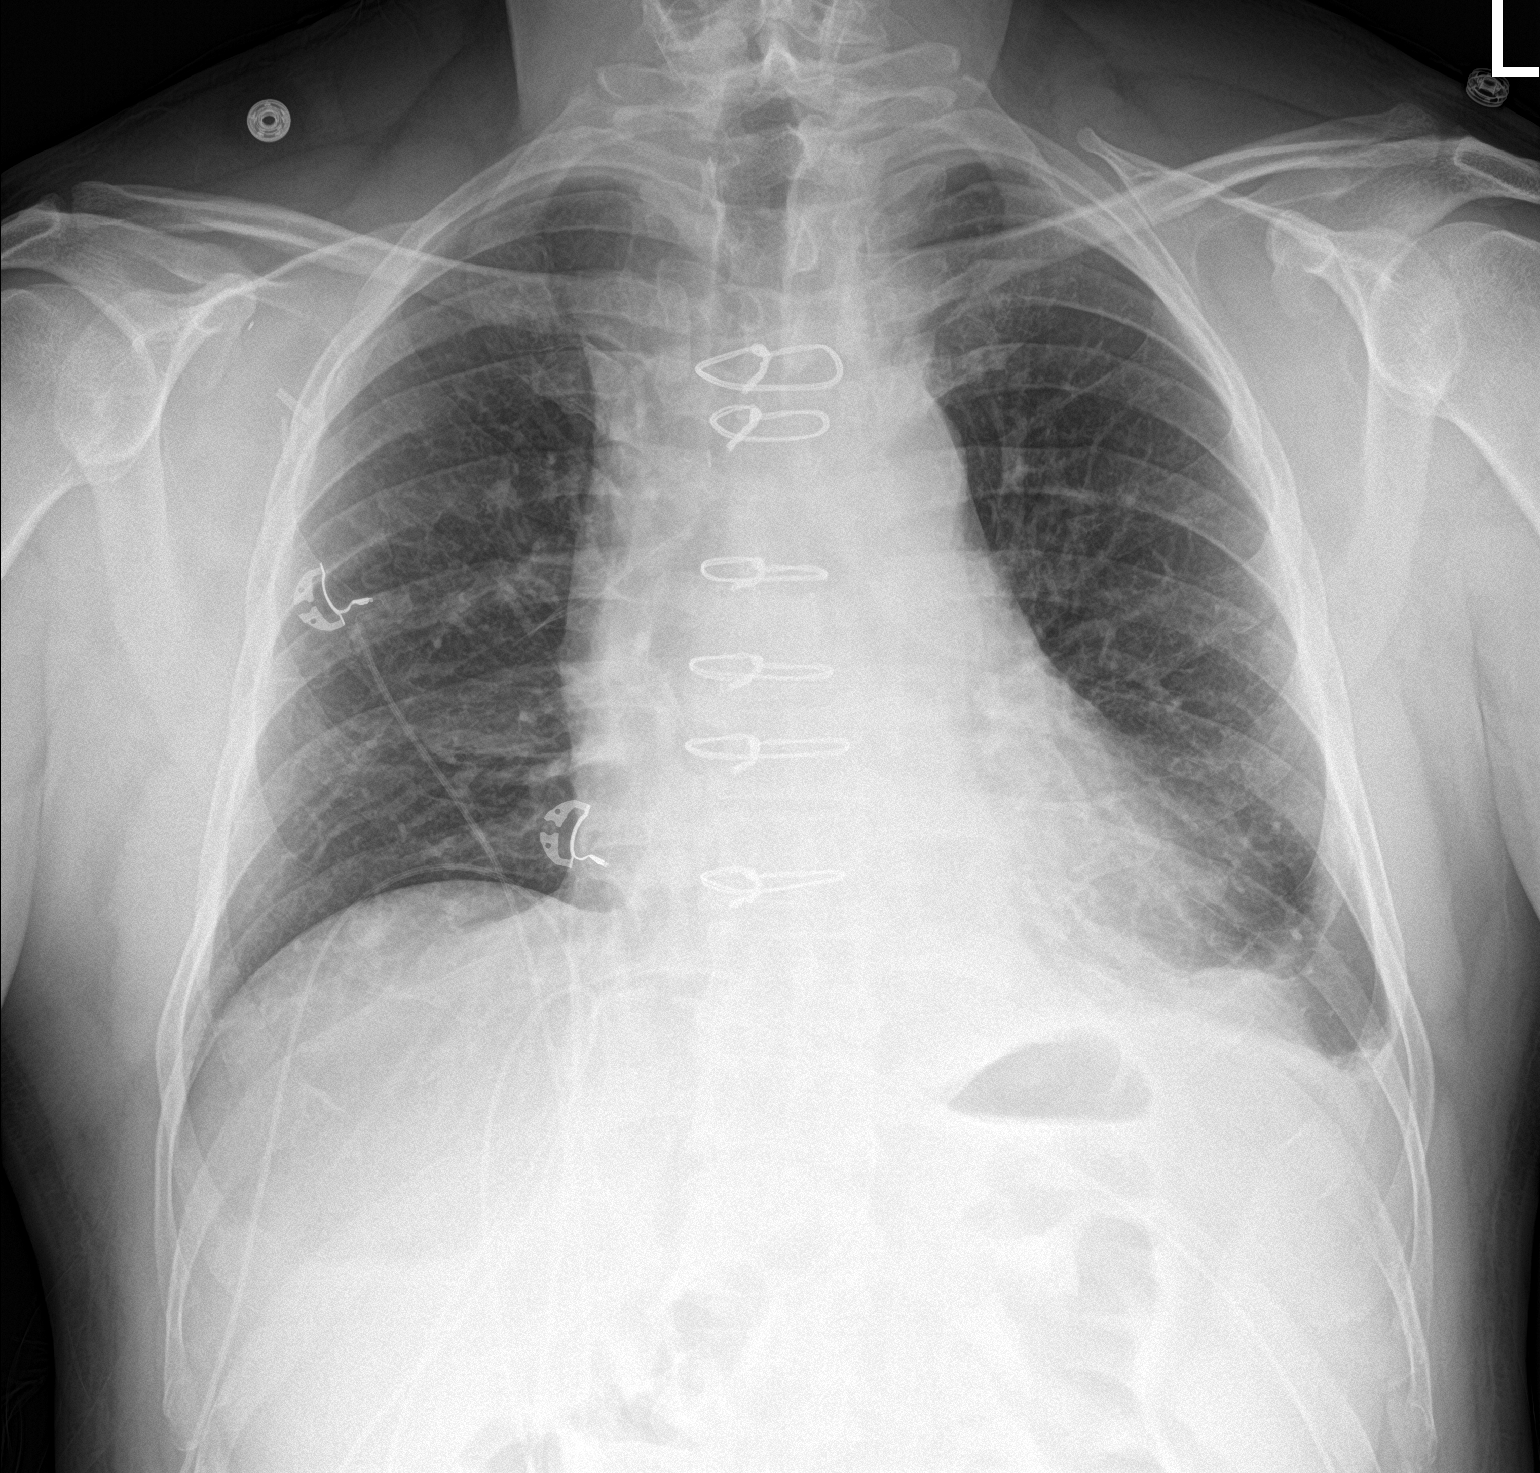

[chest lat]
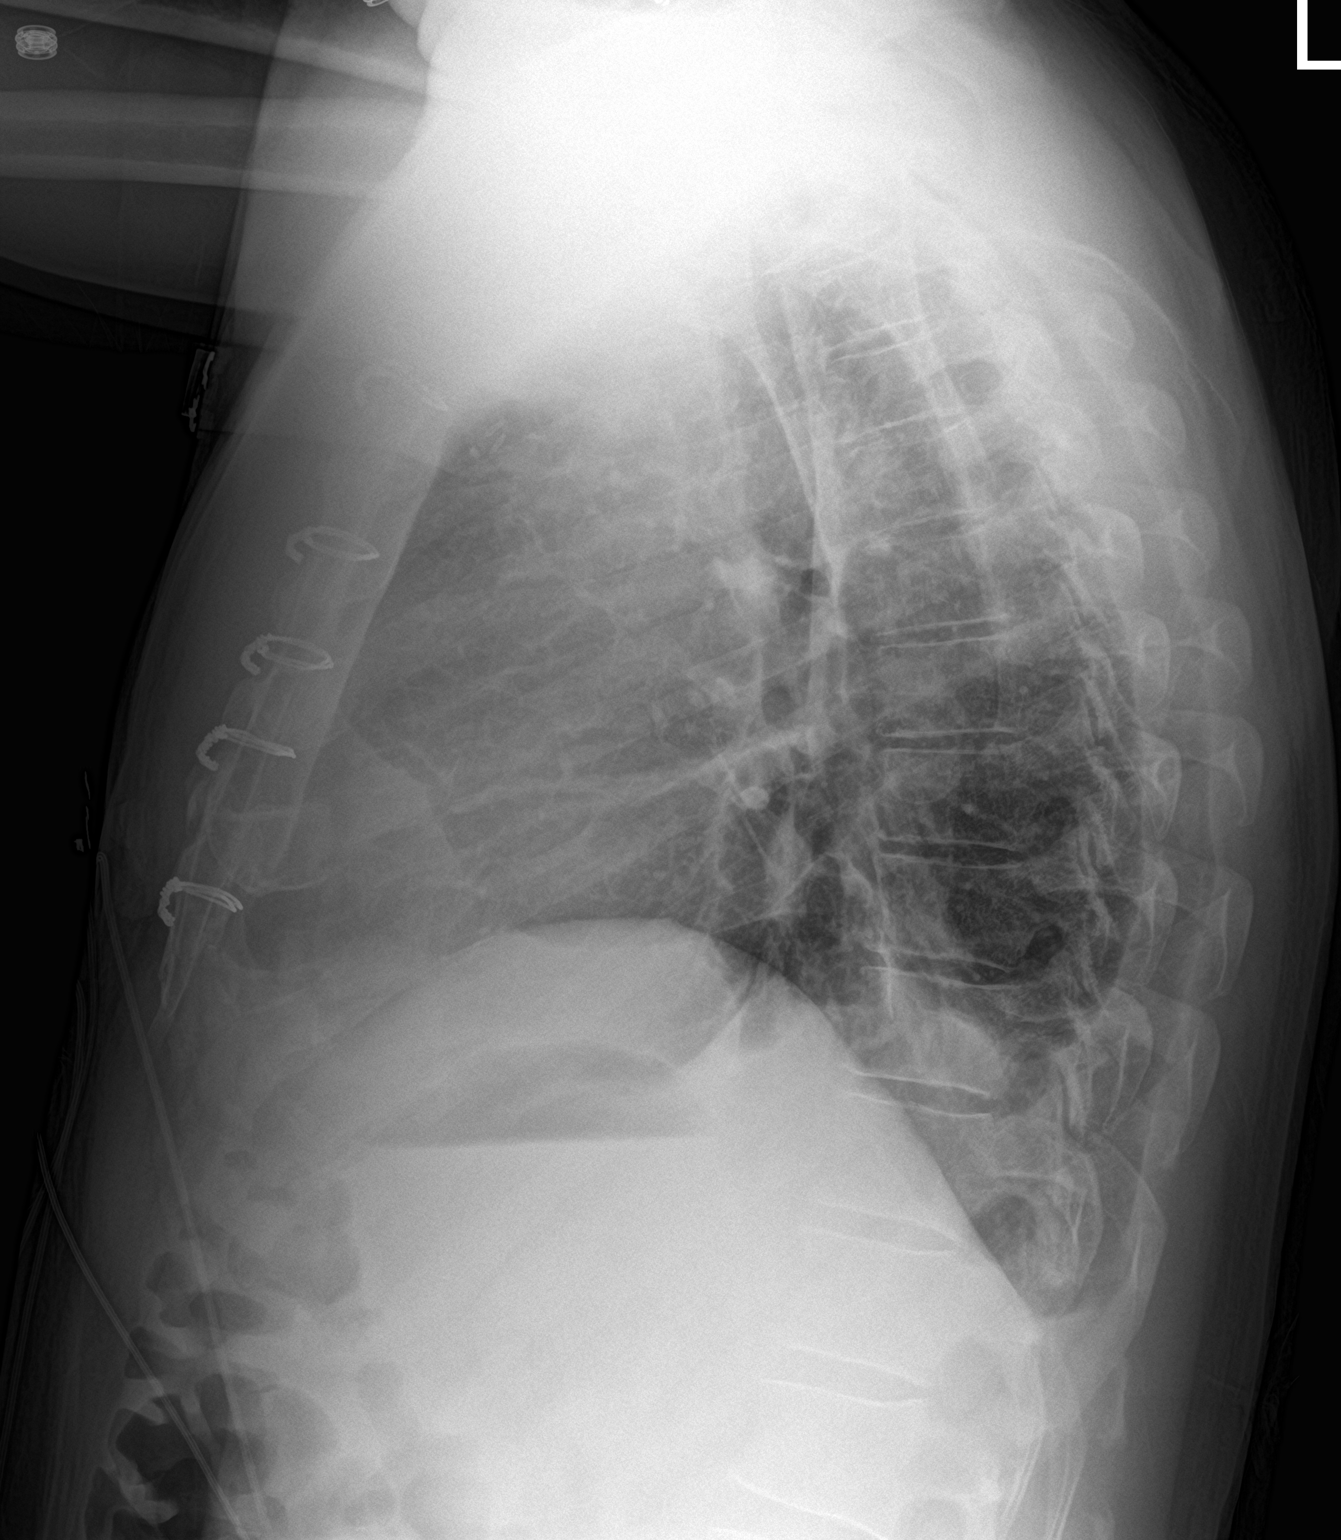

[2 of 2 positions shown; findings below may reference images not displayed]

FINDINGS: Small left pleural effusion with mild left base atelectasis present.
Lungs elsewhere clear. Heart mildly enlarged with pulmonary
vascularity normal. No adenopathy. Status post median sternotomy. No
adenopathy. Surgical clips noted in right axillary region.
IMPRESSION: Small left pleural effusion with mild left base atelectasis. Lungs
elsewhere clear. Stable cardiac silhouette with postoperative
changes.

## 2022-07-01 IMAGING — CR DG CHEST 2V
2 series · 2 of 2 positions shown · non-contrast
Comparison: PA and lateral chest 04/08/2020.

CLINICAL DATA: Patient status post thoracic aortic aneurysm repair
04/01/2020.

EXAM:
CHEST - 2 VIEW

[w chest pa]
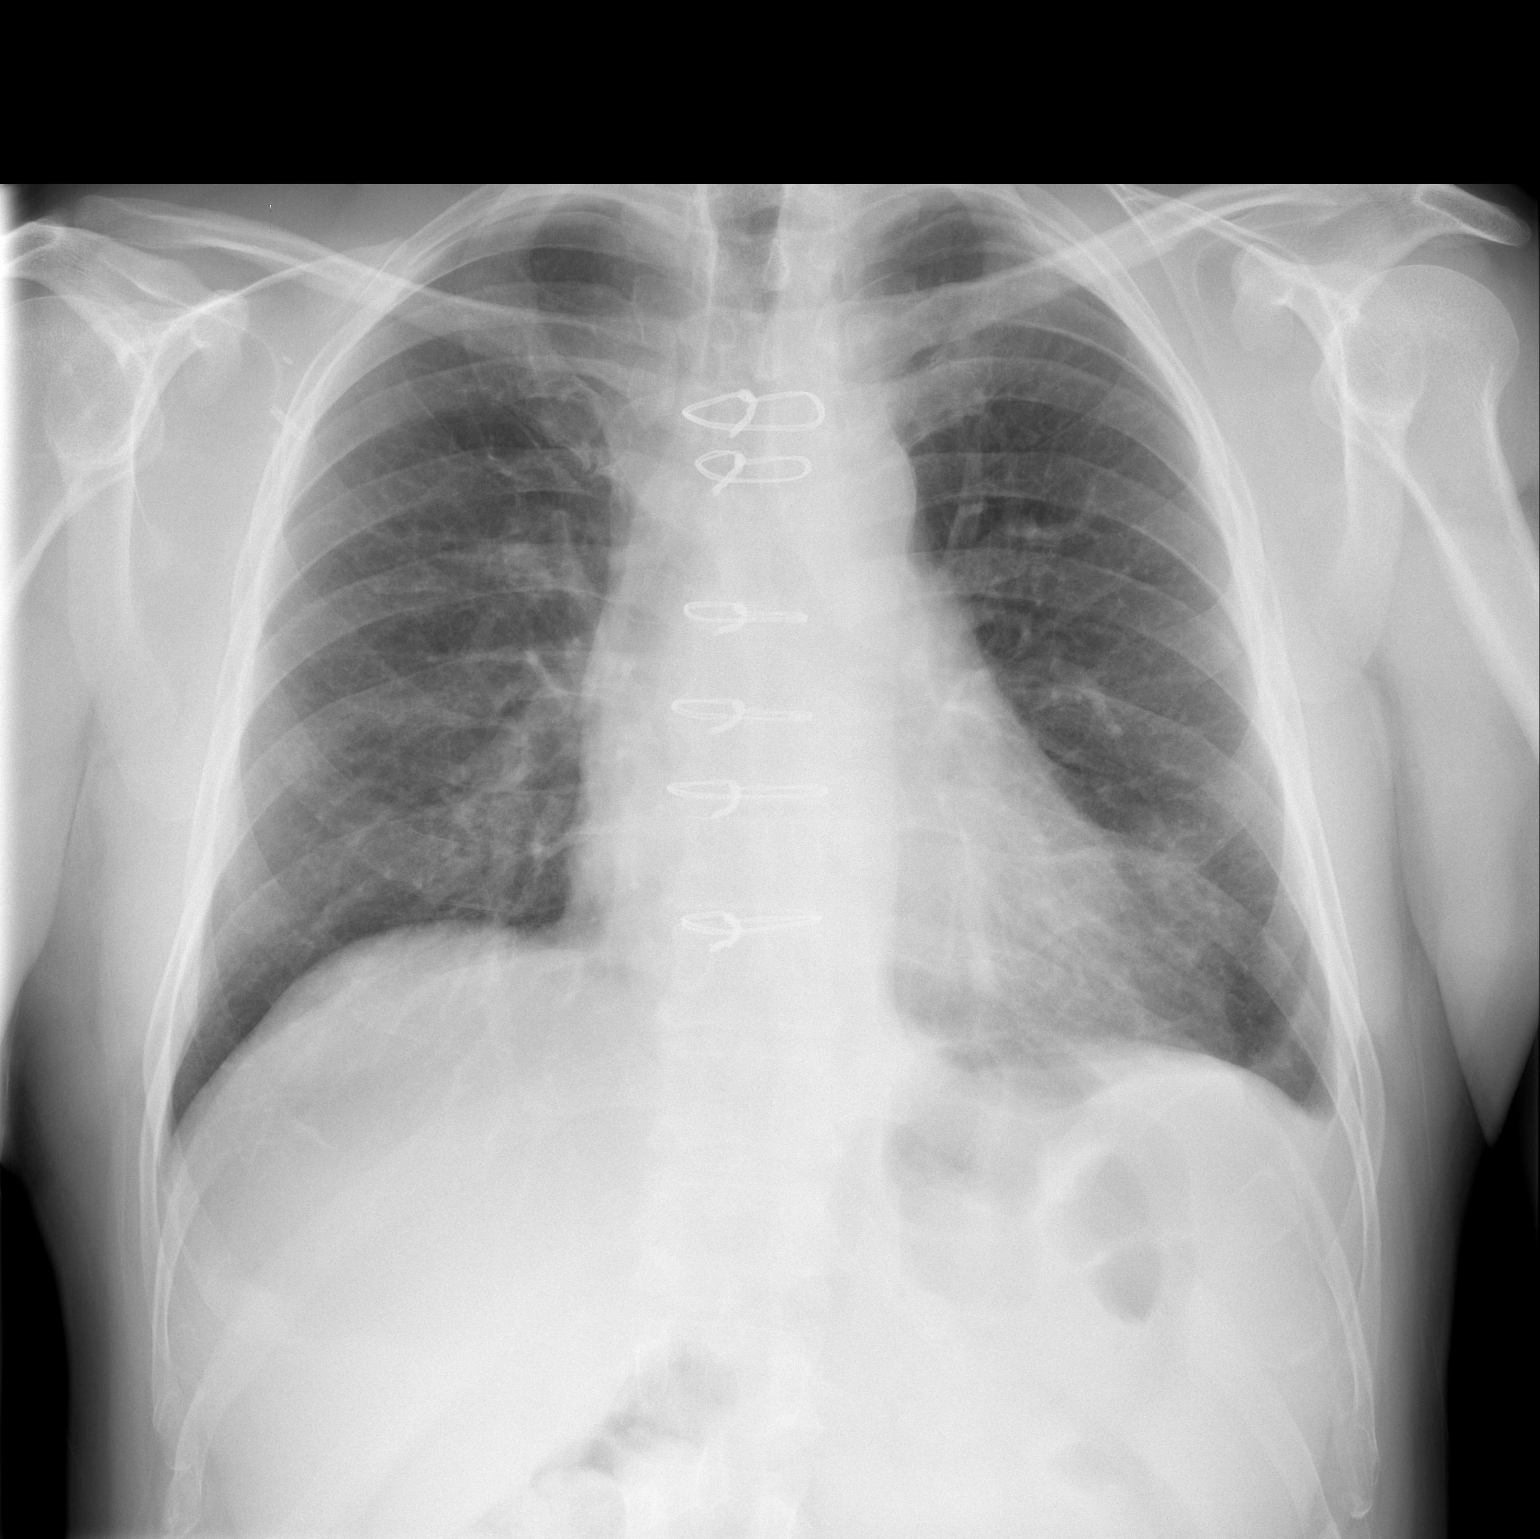

[w chest lat]
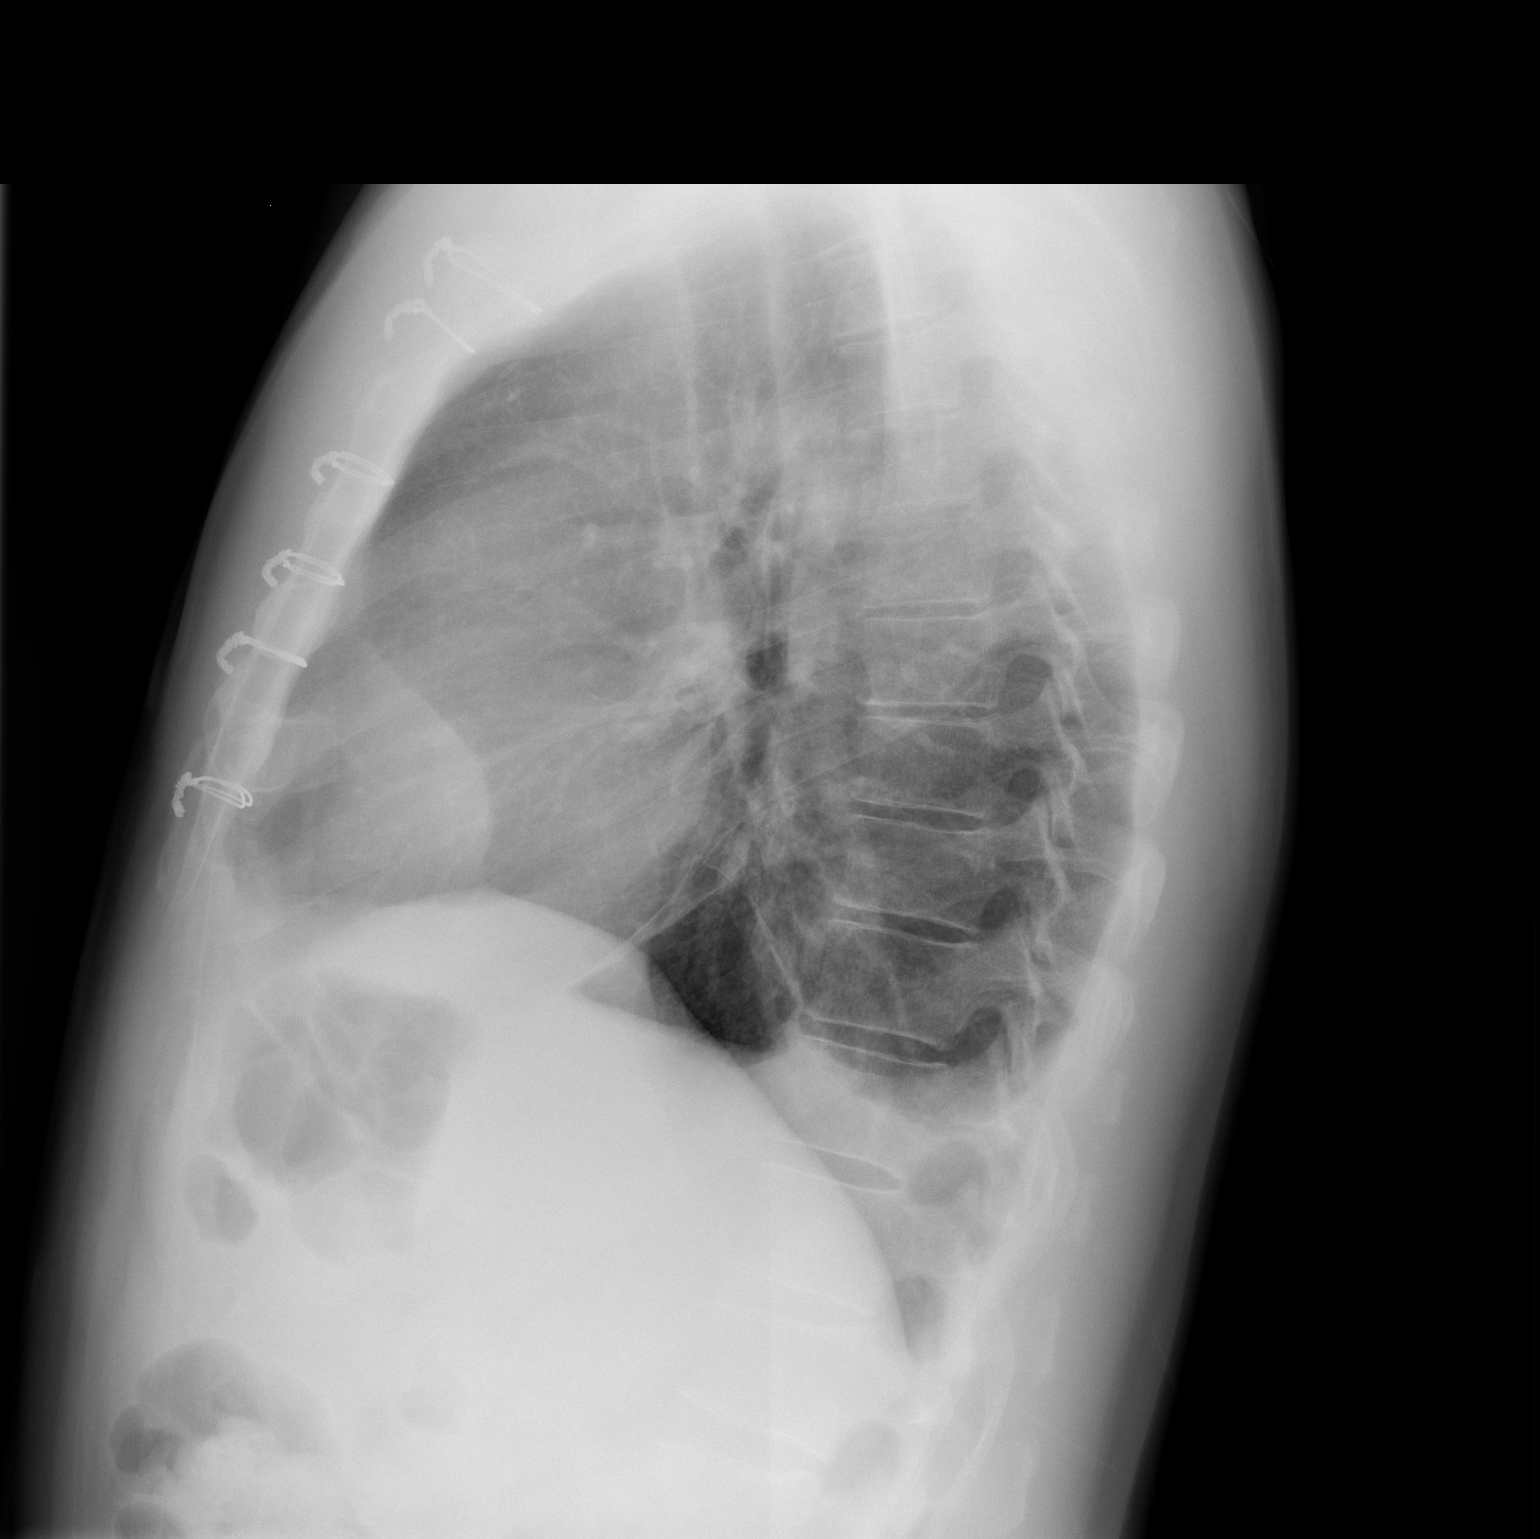

[2 of 2 positions shown; findings below may reference images not displayed]

FINDINGS: Median sternotomy wires are intact and unchanged in position. Small
left pleural effusion basilar atelectasis persist but have improved.
The right lung is clear. No pneumothorax.
IMPRESSION: Decreased small left pleural effusion and basilar atelectasis. No
acute abnormality.

## 2022-08-22 ENCOUNTER — Other Ambulatory Visit: Payer: Self-pay | Admitting: *Deleted

## 2022-08-22 ENCOUNTER — Encounter: Payer: Self-pay | Admitting: *Deleted

## 2022-08-22 DIAGNOSIS — E785 Hyperlipidemia, unspecified: Secondary | ICD-10-CM

## 2022-08-22 NOTE — Progress Notes (Unsigned)
lipid

## 2022-08-31 ENCOUNTER — Other Ambulatory Visit: Payer: Self-pay | Admitting: Cardiology

## 2022-10-30 ENCOUNTER — Encounter: Payer: Self-pay | Admitting: Cardiology

## 2022-10-30 MED ORDER — OMEPRAZOLE 20 MG PO CPDR: 20.0000 mg | DELAYED_RELEASE_CAPSULE | Freq: Every day | ORAL | 3 refills | Status: DC

## 2022-11-17 ENCOUNTER — Encounter: Payer: Self-pay | Admitting: *Deleted

## 2022-12-01 ENCOUNTER — Ambulatory Visit: Payer: BC Managed Care – PPO | Admitting: Cardiology

## 2023-02-08 ENCOUNTER — Ambulatory Visit: Payer: BC Managed Care – PPO | Admitting: Cardiology

## 2023-02-16 ENCOUNTER — Ambulatory Visit: Payer: BC Managed Care – PPO | Attending: Cardiology | Admitting: Cardiology

## 2023-02-16 ENCOUNTER — Encounter: Payer: Self-pay | Admitting: Cardiology

## 2023-02-16 VITALS — BP 126/84 | HR 68 | Ht 74.0 in | Wt 233.2 lb

## 2023-02-16 DIAGNOSIS — N182 Chronic kidney disease, stage 2 (mild): Secondary | ICD-10-CM

## 2023-02-16 DIAGNOSIS — E785 Hyperlipidemia, unspecified: Secondary | ICD-10-CM | POA: Diagnosis not present

## 2023-02-16 DIAGNOSIS — I7121 Aneurysm of the ascending aorta, without rupture: Secondary | ICD-10-CM

## 2023-02-16 DIAGNOSIS — R002 Palpitations: Secondary | ICD-10-CM

## 2023-02-16 DIAGNOSIS — I10 Essential (primary) hypertension: Secondary | ICD-10-CM

## 2023-02-16 NOTE — Patient Instructions (Signed)
Medication Instructions:  Stop metoprolol.  *If you need a refill on your cardiac medications before your next appointment, please call your pharmacy*   Lab Work: FASTING Lipid, TSH, BMET next 2 weeks. If you have labs (blood work) drawn today and your tests are completely normal, you will receive your results only by: MyChart Message (if you have MyChart) OR A paper copy in the mail If you have any lab test that is abnormal or we need to change your treatment, we will call you to review the results.   At [facility], you and your health needs are our priority.  As part of our continuing mission to provide you with exceptional heart care, we have created designated Provider Care Teams.  These Care Teams include your primary Cardiologist (physician) and Advanced Practice Providers (APPs -  Physician Assistants and Nurse Practitioners) who all work together to provide you with the care you need, when you need it.  We recommend signing up for the patient portal called "MyChart".  Sign up information is provided on this After Visit Summary.  MyChart is used to connect with patients for Virtual Visits (Telemedicine).  Patients are able to view lab/test results, encounter notes, upcoming appointments, etc.  Non-urgent messages can be sent to your provider as well.   To learn more about what you can do with MyChart, go to [email or web address].    Your next appointment:   12 month(s)  Provider:   Rollene Rotunda, MD    {Other Instructions Referral to Dr Gwinda Passe General Surgery with [facility] in [place] for evaluation of Ventral hernia.

## 2023-03-09 ENCOUNTER — Other Ambulatory Visit: Payer: Self-pay | Admitting: Family Medicine

## 2023-03-09 ENCOUNTER — Telehealth: Payer: Self-pay | Admitting: Cardiology

## 2023-03-09 NOTE — Telephone Encounter (Signed)
Left message for pt to call.

## 2023-03-09 NOTE — Telephone Encounter (Signed)
Pt c/o medication issue:  1. Name of Medication:  metoprolol succinate (TOPROL-XL) 50 MG 24 hr tablet   2. How are you currently taking this medication (dosage and times per day)? Started taking meds he already had  3. Are you having a reaction (difficulty breathing--STAT)?   4. What is your medication issue? Patient states he tried to stop this medication per his last visit with Dr. Antoine Poche. He states that he was unable to get heart rate under control, so he started to take the remainder of this medication. He states that he would need more called into his pharmacy and would like a call back to discuss starting this again. Please advise.

## 2023-03-12 NOTE — Telephone Encounter (Signed)
Patient identification verified by 2 forms. Clayton Rail, RN    Called and spoke to patient  Patient states:   -initially tried to d/c metoprolol after 11/01 appointment   -stopped taking it for a few days and felt uncomfortable   -felt like is heart was running away   -developed palpitations during the day   -symptoms resolved once he resumed metoprolol   -continues to feel tired with medication  -was advised to call for new prescription if he restarts  Informed patient message sent to Dr. Antoine Poche

## 2023-03-21 MED ORDER — METOPROLOL SUCCINATE ER 50 MG PO TB24: 50.0000 mg | ORAL_TABLET | Freq: Every day | ORAL | 3 refills | Status: AC

## 2023-03-21 NOTE — Telephone Encounter (Signed)
Patient returned staff call. 

## 2023-03-21 NOTE — Telephone Encounter (Signed)
 Attempted to call patient, no answer left message requesting a call back.

## 2023-03-21 NOTE — Telephone Encounter (Signed)
Rollene Rotunda, MD  You7 days ago    OK to refill Toprol XL 50 mg PO daily disp number 90 with 3 refills.  Call Mr. Mausolf with the results and send results to [place], Maryjean Morn, MD

## 2023-03-21 NOTE — Telephone Encounter (Signed)
Patient identification verified by 2 forms. Marilynn Rail, RN    Called and spoke to patient  Relayed provider message below  Patient states:   -will complete labs next week   -has no received call about scheduling appointment with Dr. Corliss Skains   -would like Rx sent to [facility]   -would like mychart message with Dr. Corliss Skains number  Informed patient mychart message sent and Rx sent to preferred pharmacy  Patient verbalized understanding, no questions at this time

## 2023-04-25 ENCOUNTER — Other Ambulatory Visit: Payer: Self-pay | Admitting: Cardiology

## 2023-05-11 ENCOUNTER — Other Ambulatory Visit: Payer: Self-pay | Admitting: Cardiology

## 2023-10-30 ENCOUNTER — Other Ambulatory Visit: Payer: Self-pay | Admitting: Cardiology

## 2024-04-19 ENCOUNTER — Other Ambulatory Visit: Payer: Self-pay | Admitting: Cardiology

## 2024-05-19 ENCOUNTER — Other Ambulatory Visit: Payer: Self-pay | Admitting: Cardiology
